# Patient Record
Sex: Female | Born: 1950 | Race: White | Hispanic: No | Marital: Married | Smoking: Never smoker
Health system: Southern US, Community
[De-identification: ages and names within clinical notes are randomized; demographics above are authoritative.]

## PROBLEM LIST (undated history)

## (undated) DIAGNOSIS — E039 Hypothyroidism, unspecified: Secondary | ICD-10-CM

## (undated) DIAGNOSIS — I10 Essential (primary) hypertension: Secondary | ICD-10-CM

## (undated) DIAGNOSIS — M1990 Unspecified osteoarthritis, unspecified site: Secondary | ICD-10-CM

## (undated) DIAGNOSIS — E7800 Pure hypercholesterolemia, unspecified: Secondary | ICD-10-CM

## (undated) DIAGNOSIS — F419 Anxiety disorder, unspecified: Secondary | ICD-10-CM

## (undated) DIAGNOSIS — G43909 Migraine, unspecified, not intractable, without status migrainosus: Secondary | ICD-10-CM

## (undated) DIAGNOSIS — J45909 Unspecified asthma, uncomplicated: Secondary | ICD-10-CM

## (undated) HISTORY — DX: Migraine, unspecified, not intractable, without status migrainosus: G43.909

## (undated) HISTORY — DX: Pure hypercholesterolemia, unspecified: E78.00

## (undated) HISTORY — DX: Essential (primary) hypertension: I10

## (undated) HISTORY — PX: TONSILLECTOMY AND ADENOIDECTOMY: SHX28

## (undated) HISTORY — DX: Anxiety disorder, unspecified: F41.9

---

## 2002-11-05 ENCOUNTER — Encounter: Payer: Self-pay | Admitting: Emergency Medicine

## 2002-11-05 ENCOUNTER — Emergency Department (HOSPITAL_COMMUNITY): Admission: EM | Admit: 2002-11-05 | Discharge: 2002-11-05 | Payer: Self-pay | Admitting: Emergency Medicine

## 2003-01-08 ENCOUNTER — Encounter: Payer: Self-pay | Admitting: Family Medicine

## 2003-01-08 ENCOUNTER — Ambulatory Visit (HOSPITAL_COMMUNITY): Admission: RE | Admit: 2003-01-08 | Discharge: 2003-01-08 | Payer: Self-pay | Admitting: Family Medicine

## 2003-01-13 ENCOUNTER — Ambulatory Visit (HOSPITAL_COMMUNITY): Admission: RE | Admit: 2003-01-13 | Discharge: 2003-01-13 | Payer: Self-pay | Admitting: Family Medicine

## 2003-01-13 ENCOUNTER — Encounter: Payer: Self-pay | Admitting: Family Medicine

## 2003-04-16 ENCOUNTER — Encounter (HOSPITAL_COMMUNITY): Admission: RE | Admit: 2003-04-16 | Discharge: 2003-05-16 | Payer: Self-pay | Admitting: Neurosurgery

## 2004-03-31 ENCOUNTER — Encounter: Payer: Self-pay | Admitting: Orthopedic Surgery

## 2004-04-06 ENCOUNTER — Ambulatory Visit (HOSPITAL_COMMUNITY): Admission: RE | Admit: 2004-04-06 | Discharge: 2004-04-06 | Payer: Self-pay | Admitting: Orthopedic Surgery

## 2004-11-25 ENCOUNTER — Ambulatory Visit: Payer: Self-pay | Admitting: Cardiology

## 2006-04-24 ENCOUNTER — Ambulatory Visit: Payer: Self-pay | Admitting: Cardiology

## 2009-07-28 ENCOUNTER — Encounter: Payer: Self-pay | Admitting: Orthopedic Surgery

## 2009-12-15 ENCOUNTER — Ambulatory Visit (HOSPITAL_COMMUNITY): Admission: RE | Admit: 2009-12-15 | Discharge: 2009-12-15 | Payer: Self-pay | Admitting: Family Medicine

## 2009-12-15 ENCOUNTER — Encounter: Payer: Self-pay | Admitting: Orthopedic Surgery

## 2010-01-03 ENCOUNTER — Ambulatory Visit: Payer: Self-pay | Admitting: Orthopedic Surgery

## 2010-01-03 DIAGNOSIS — M23302 Other meniscus derangements, unspecified lateral meniscus, unspecified knee: Secondary | ICD-10-CM | POA: Insufficient documentation

## 2010-01-04 ENCOUNTER — Telehealth: Payer: Self-pay | Admitting: Orthopedic Surgery

## 2010-01-05 ENCOUNTER — Ambulatory Visit (HOSPITAL_COMMUNITY): Admission: RE | Admit: 2010-01-05 | Discharge: 2010-01-05 | Payer: Self-pay | Admitting: Orthopedic Surgery

## 2010-01-10 ENCOUNTER — Ambulatory Visit: Payer: Self-pay | Admitting: Orthopedic Surgery

## 2010-01-10 DIAGNOSIS — M25569 Pain in unspecified knee: Secondary | ICD-10-CM | POA: Insufficient documentation

## 2010-01-10 DIAGNOSIS — S82109A Unspecified fracture of upper end of unspecified tibia, initial encounter for closed fracture: Secondary | ICD-10-CM | POA: Insufficient documentation

## 2010-03-07 ENCOUNTER — Ambulatory Visit: Payer: Self-pay | Admitting: Orthopedic Surgery

## 2010-03-14 ENCOUNTER — Telehealth: Payer: Self-pay | Admitting: Orthopedic Surgery

## 2010-04-05 ENCOUNTER — Ambulatory Visit: Payer: Self-pay | Admitting: Orthopedic Surgery

## 2010-12-25 ENCOUNTER — Encounter: Payer: Self-pay | Admitting: Orthopedic Surgery

## 2011-01-05 NOTE — Letter (Signed)
Summary: *Orthopedic Consult Note  [facility]  [address]  [place], [place] [address]   Phone: [phone]  Fax: [phone]    Re:    Diana Skinner DOB:    05/01/1951   Dear: Loraine Leriche   Thank you for requesting that we see the above patient for consultation.  A copy of the detailed office note will be sent under separate cover, for your review.  Evaluation today is consistent with:  1)  DERANGEMENT MENISCUS (ICD-717.5)   Our recommendation is for: MRI  Rest, activity modification, ibuprofen 800 t.i.d., ice t.i.d.       Thank you for this opportunity to look after your patient.  Sincerely,   Terrance Mass. MD.

## 2011-01-05 NOTE — Progress Notes (Signed)
Summary: patient question about left leg  Phone Note Call from Patient   Caller: Patient Summary of Call: Patient called to ask about swelling and tightness around calf, after having been on her leg for long periods over the weekend with Mom in hospital.  States wearing brace all the time; states ankle swelling now down, as she did rest and used  ice and heat alternating.  Any other recommendations?  Her appt is this month.  Any other recommendations? Work ph [phone] until 5:30pm, or Cell ph [phone] Initial call taken by: Cammie Sickle,  March 14, 2010 4:29 PM  Follow-up for Phone Call        Use heat not ice, take Tylenol as needed, continue brace Follow-up by: Ether Griffins,  March 14, 2010 4:35 PM  Additional Follow-up for Phone Call Additional follow up Details #1::        advised patient. Additional Follow-up by: Cammie Sickle,  March 14, 2010 4:49 PM

## 2011-01-05 NOTE — Letter (Signed)
Summary: History form  History form   Imported By: Jacklynn Ganong 01/05/2010 16:40:07  _____________________________________________________________________  External Attachment:    Type:   Image     Comment:   External Document

## 2011-01-05 NOTE — Assessment & Plan Note (Signed)
Summary: 4 WK RE-CK LT KNEE/BCBS/CAF   Visit Type:  Follow-up Referring Provider:  Dr. Patrica Duel.  CC:  recheck left knee.  History of Present Illness: a 60 year old female with proximal tibial stress reaction treated with rest activity modification and bracing.  She used a cane and some ibuprofen.  She says she's doing better with 95% improvement  Medial tibial area is mildly tender there is no pain with valgus stress there is no joint effusion there is normal range of motion  Impression healed medial tibial stress reaction patient is allowed to gradually return to normal activity      Allergies: 1)  ! Sulfa   Impression & Recommendations:  Problem # 1:  CLOSED FRACTURE OF UPPER END OF TIBIA (ICD-823.00) Assessment Improved  Orders: Est. Patient Level II (81191)  Patient Instructions: 1)  Walking ok  2)  return as needed

## 2011-01-05 NOTE — Assessment & Plan Note (Signed)
Summary: LT KNEE PAIN/XRAY [facility] 12/15/09/REF [facility]EN[facility]/BCBS/CAF   Visit Type:  New problem Referring Provider:  Dr. Patrica Duel.  CC:  left knee pain.  History of Present Illness: I saw Diana Skinner in the office today for a followup visit.  She is a 60 years old woman with the complaint of:  NEW PROBLEM. LEFT KNEE PAIN.  This is a 60 year old female who started having some LEFT knee pain near Christmas time when she slipped and fell she reached now complains of severe throbbing constant pain which is worse in the evening after walking all day.  She complains of locking and swelling.  She did place some ice on her knee and took some ibuprofen but got concerned that she was taking too much ibuprofen.   [name] on 12-15-09. Small joint effusion. No other finding.      Allergies: 1)  ! Sulfa  Past History:  Past Surgical History: Tonsillectomy C-section  Physical Exam  Additional Exam:  Her vital signs are stable as recorded she is well-developed and nourished her grooming and hygiene or excellent she is oriented x3 her mood is pleasant  Her gait is altered by a LEFT lower extremity limp her LEFT knee is swollen and tender on the medial compartment she has a positive Murray sign her range of motion is limited to 100.  The knee is stable.  Strength in the knee is normal.  Skin is intact.  RIGHT knee inspection normal range of motion normal stability normal strength normal skin normal  Pulses and temperature are normal in both lower extremities with no edema or swelling.  She has no lymphadenopathy her sensation is normal in both legs and her reflexes are 2+ at the knee and ankle coordination and balance remained normal     Impression & Recommendations:  Problem # 1:  DERANGEMENT MENISCUS (ICD-717.5) Assessment New  the x-rays at the hospital 4 views of the knee show small joint effusion no arthritis  I agree with the report as well.  She has McMurray sign which  is positive and medial joint line tenderness and locking.  She most likely has a medial meniscal tear.  Recommend ice, anti-inflammatory, activity modification an MRI to confirm in preparation for surgery.  Orders: New Patient Level III (40981)  Patient Instructions: 1)  MRI LEFT KNEE F/U HERE  2)  TORN MEDIAL MENISCUS  3)  APPLY ICE UP TO 3 X A DAY FOR 30 MINUTES  4)  TAKE IBUPROFEN 800MG  by mouth three times a day UNTIL YOU COME BACK  5)  AVOID KNEELING AND SQUATTING OR XS WALKING

## 2011-01-05 NOTE — Assessment & Plan Note (Signed)
Summary: mri results from [facility]   Visit Type:  Follow-up Referring Provider:  Dr. Patrica Duel.  CC:  mri results left knee.  History of Present Illness: I saw Diana Skinner in the office today for a followup visit.  She is a 60 years old woman with the complaint of:  DX:  Left knee pain.  Image:  MRI left knee [facility] 01/05/10/  Reading: IMPRESSION: 1.  Small subchondral impaction fracture in the far medial weightbearing surface of the medial femoral condyle with surrounding edema.  Edema extends to the adjacent soft tissues. 2.  Ruptured Baker's cyst with fluid tracking in the popliteal fossa and proximal medial leg. 3.  Intact cruciate ligaments, collateral ligaments, and menisci with degenerative changes of posterior horn of the medial meniscus.  Ice and Ibuprofen use for pain.  Not so bad today, has alot of soreness left calf.  Swelling is down, no brace.  The MRI was done at [facility] and it was reviewed with the report   Rec Brace for stress reaction in the bone    Allergies: 1)  ! Sulfa   Impression & Recommendations:  Problem # 1:  KNEE PAIN (ICD-719.46)  Problem # 2:  CLOSED FRACTURE OF UPPER END OF TIBIA (ICD-823.00)  This is a stress reaction !  Orders: Est. Patient Level II (40981)  Patient Instructions: 1)  wear brace when walking  2)  wear for 8 weeks  3)  return in 8 weeks

## 2011-01-05 NOTE — Assessment & Plan Note (Signed)
Summary: 8 WK RE-CK LT KNEE/FOLL'G BRACE/BCBS/CAF   Visit Type:  Follow-up Referring Provider:  Dr. Patrica Duel.  CC:  recheck left knee.  History of Present Illness: 60 yo female with stress fracture left proximal tibia. Treated with ECON HINGE brace x 8 weeks    C/o medial knee pain with tightness in the calf. the swelling has gone down.     MEDS: IBUPROFEN.        Allergies: 1)  ! Sulfa  Past History:  Past medical, surgical, family and social histories (including risk factors) reviewed, and no changes noted (except as noted below).  Past Medical History: Reviewed history from 07/27/2009 and no changes required. Migraines  Past Surgical History: Reviewed history from 01/03/2010 and no changes required. Tonsillectomy C-section  Family History: Reviewed history and no changes required.  Social History: Reviewed history and no changes required.   Knee Exam  General:    Well-developed, well-nourished, normal body habitus; no deformities, normal grooming.  Gait:    limp noted-left.    Skin:    Intact, no scars, lesions, rashes, cafe au lait spots, or bruising.    Inspection:     No deformity, ecchymosis mild swelling in the ankle distal to the brace   Palpation:    tenderness L-medial joint line.    Vascular:    The pulses and perfusion were normal with normal color, temperature  and no swelling   Sensory:    Gross coordination and sensation were normal.    Motor:    Motor strength 5/5 bilaterally for quadriceps, hamstrings, ankle dorsiflexion, and ankle plantar flexion.    Knee Exam:    Left:    Inspection:  Abnormal    Palpation:  Abnormal    Stability:  stable    Range of Motion:       Flexion-Active: full       Extension-Active: full   Impression & Recommendations:  Problem # 1:  CLOSED FRACTURE OF UPPER END OF TIBIA (ICD-823.00) Assessment Comment Only  Slight improvement but still having pain   Orders: Est. Patient Level  III (13086)  Patient Instructions: 1)  Use a cane in your right hand, continue brace and meds. 2)  Come back in 4 weeks

## 2011-01-05 NOTE — Progress Notes (Signed)
Summary: MRI appointment.  Phone Note Outgoing Call   Call placed by: Waldon Reining,  January 04, 2010 12:03 PM Call placed to: Patient Action Taken: Phone Call Completed, Appt scheduled Summary of Call: I called to give patient her MRI appointment at [facility] on 01-05-10 at 4:30. Patient has BCBS, authorization [id number] and expires on 02-02-10. Patient will follow up here for her results.

## 2011-05-30 ENCOUNTER — Ambulatory Visit (INDEPENDENT_AMBULATORY_CARE_PROVIDER_SITE_OTHER): Payer: BC Managed Care – PPO | Admitting: Orthopedic Surgery

## 2011-05-30 ENCOUNTER — Encounter: Payer: Self-pay | Admitting: Orthopedic Surgery

## 2011-05-30 DIAGNOSIS — M76899 Other specified enthesopathies of unspecified lower limb, excluding foot: Secondary | ICD-10-CM

## 2011-05-30 DIAGNOSIS — M7050 Other bursitis of knee, unspecified knee: Secondary | ICD-10-CM | POA: Insufficient documentation

## 2011-05-30 MED ORDER — METHYLPREDNISOLONE ACETATE 40 MG/ML IJ SUSP: 40.0000 mg | Freq: Once | INTRAMUSCULAR | Status: DC

## 2011-05-30 NOTE — Patient Instructions (Addendum)
You have received a steroid shot. 15% of patients experience increased pain at the injection site with in the next 24 hours. This is best treated with ice and tylenol extra strength 2 tabs every 8 hours. If you are still having pain please call the office.   Apply ice 3 times a day to the medial knee   You have bursitis of the knee   DR ZOXWRUE   DR DUDA

## 2011-05-30 NOTE — Procedures (Signed)
Diagnosis of bursitis  I injected the LEFT knee for bursitis  Informed consent verbally.  Time out.  Alcohol prep ethyl chloride prep.  A 1:1 mixture Depo-Medrol 40 mg and 1% plain lidocaine injected in the pes bursal area

## 2011-05-30 NOTE — Progress Notes (Signed)
   This is a 60 year old female who was previously treated for a medial tibial stress reaction at x-rays and MRI back in 2011 recovered presents back with medial knee pain which is exacerbated by stair climbing and bike riding.  She's been having some difficulties with her RIGHT foot, seen the podiatrist and received 2 injections but continues to have swelling of the RIGHT foot and pain in the heel.  She thinks that she is limping on the RIGHT side and is causing industries stress on the LEFT knee  Her review of systems indicates no unexpected weight loss she's actually gained some weight because she can't exercise.  No skin changes no itching no redness no rash.  Musculoskeletal system findings reveal no catching or locking  Medical history as noted above  General appearance is normal  The patient is alert and oriented x3  The patient's mood and affect are normal  The patient is ambulating with a abnormal gait pattern  The cardiovascular exam reveals normal pulses and temperature without edema swelling.  The lymphatic system is negative for palpable lymph nodes  The sensory exam is normal.  There are no pathologic reflexes.  Balance is normal.  Body area:LEFT knee inspection reveals tenderness and swelling over the medial bursa with normal range of motion.  All ligaments are stable.  Muscle strength and tone are normal.  Skin is normal.  Impression x-rays show no degenerative changes maybe some mild joint space narrowing expected for age.  Diagnosis of bursitis  I injected the LEFT knee for bursitis  Informed consent verbally.  Time out.  Alcohol prep ethyl chloride prep.  A 1:1 mixture Depo-Medrol 40 mg and 1% plain lidocaine injected in the pes bursal area

## 2011-06-05 ENCOUNTER — Telehealth: Payer: Self-pay | Admitting: Radiology

## 2011-06-05 NOTE — Telephone Encounter (Signed)
I faxed a referral for this patient to Dr. Lajoyce Corners to be seen for right foot pain.

## 2012-01-23 ENCOUNTER — Other Ambulatory Visit (HOSPITAL_COMMUNITY): Payer: Self-pay | Admitting: Internal Medicine

## 2012-01-23 DIAGNOSIS — Z139 Encounter for screening, unspecified: Secondary | ICD-10-CM

## 2012-01-24 ENCOUNTER — Telehealth: Payer: Self-pay

## 2012-01-24 NOTE — Telephone Encounter (Signed)
I called pt to triage. She requests to see Dr. Karilyn Cota.  I told her I will fax Marylu Lund a note at [facility] of her request. She said that her son and husband sees dr. Karilyn Cota.

## 2012-01-26 ENCOUNTER — Encounter (INDEPENDENT_AMBULATORY_CARE_PROVIDER_SITE_OTHER): Payer: Self-pay | Admitting: *Deleted

## 2012-01-29 ENCOUNTER — Ambulatory Visit (HOSPITAL_COMMUNITY): Admission: RE | Admit: 2012-01-29 | Payer: BC Managed Care – PPO | Source: Ambulatory Visit

## 2012-02-12 ENCOUNTER — Ambulatory Visit (HOSPITAL_COMMUNITY): Payer: BC Managed Care – PPO

## 2012-02-19 ENCOUNTER — Ambulatory Visit (HOSPITAL_COMMUNITY): Admission: RE | Admit: 2012-02-19 | Payer: BC Managed Care – PPO | Source: Ambulatory Visit

## 2012-04-25 ENCOUNTER — Telehealth (INDEPENDENT_AMBULATORY_CARE_PROVIDER_SITE_OTHER): Payer: Self-pay | Admitting: *Deleted

## 2012-04-25 ENCOUNTER — Encounter (INDEPENDENT_AMBULATORY_CARE_PROVIDER_SITE_OTHER): Payer: Self-pay | Admitting: *Deleted

## 2012-04-25 ENCOUNTER — Other Ambulatory Visit (INDEPENDENT_AMBULATORY_CARE_PROVIDER_SITE_OTHER): Payer: Self-pay | Admitting: *Deleted

## 2012-04-25 DIAGNOSIS — Z1211 Encounter for screening for malignant neoplasm of colon: Secondary | ICD-10-CM

## 2012-04-25 MED ORDER — PEG-KCL-NACL-NASULF-NA ASC-C 100 G PO SOLR: 1.0000 | Freq: Once | ORAL | Status: DC

## 2012-04-25 NOTE — Telephone Encounter (Signed)
Patient needs movi prep 

## 2012-05-23 ENCOUNTER — Telehealth (INDEPENDENT_AMBULATORY_CARE_PROVIDER_SITE_OTHER): Payer: Self-pay | Admitting: *Deleted

## 2012-05-23 NOTE — Telephone Encounter (Signed)
PCP/Requesting MD: fusco  Name & DOB: Diana Skinner 16-Mar-2051     Procedure: tcs  Reason/Indication:  screening  Has patient had this procedure before?  no  If so, when, by whom and where?    Is there a family history of colon cancer?  no  Who?  What age when diagnosed?    Is patient diabetic?   no      Does patient have prosthetic heart valve?  no  Do you have a pacemaker?  no  Has patient had joint replacement within last 12 months?  no  Is patient on Coumadin, Plavix and/or Aspirin? no  Medications: losartan 50 mg daily, venlafaxine 75 mg daily, simvastatin 20 mg daily  Allergies: zocor, sulfur  Medication Adjustment:   Procedure date & time: 06/19/12 at 1030

## 2012-05-24 NOTE — Telephone Encounter (Signed)
agree

## 2012-06-04 ENCOUNTER — Encounter (HOSPITAL_COMMUNITY): Payer: Self-pay | Admitting: Pharmacy Technician

## 2012-06-19 ENCOUNTER — Encounter (HOSPITAL_COMMUNITY)
Admission: RE | Disposition: A | Discharge status: Home / Self Care | Payer: Self-pay | Source: Ambulatory Visit | Attending: Internal Medicine

## 2012-06-19 ENCOUNTER — Encounter (HOSPITAL_COMMUNITY): Payer: Self-pay | Admitting: *Deleted

## 2012-06-19 ENCOUNTER — Ambulatory Visit (HOSPITAL_COMMUNITY)
Admission: RE | Admit: 2012-06-19 | Discharge: 2012-06-19 | Disposition: A | Discharge status: Home / Self Care | Payer: BC Managed Care – PPO | Source: Ambulatory Visit | Attending: Internal Medicine | Admitting: Internal Medicine

## 2012-06-19 DIAGNOSIS — K644 Residual hemorrhoidal skin tags: Secondary | ICD-10-CM

## 2012-06-19 DIAGNOSIS — I10 Essential (primary) hypertension: Secondary | ICD-10-CM | POA: Insufficient documentation

## 2012-06-19 DIAGNOSIS — Z1211 Encounter for screening for malignant neoplasm of colon: Secondary | ICD-10-CM

## 2012-06-19 DIAGNOSIS — E7800 Pure hypercholesterolemia, unspecified: Secondary | ICD-10-CM | POA: Insufficient documentation

## 2012-06-19 DIAGNOSIS — K5730 Diverticulosis of large intestine without perforation or abscess without bleeding: Secondary | ICD-10-CM | POA: Insufficient documentation

## 2012-06-19 HISTORY — DX: Unspecified osteoarthritis, unspecified site: M19.90

## 2012-06-19 HISTORY — PX: COLONOSCOPY: SHX5424

## 2012-06-19 SURGERY — COLONOSCOPY
Anesthesia: Moderate Sedation

## 2012-06-19 MED ORDER — STERILE WATER FOR IRRIGATION IR SOLN
Status: DC | PRN
  Administered 2012-06-19: 10:00:00

## 2012-06-19 MED ORDER — SODIUM CHLORIDE 0.45 % IV SOLN
Freq: Once | INTRAVENOUS | Status: AC
  Administered 2012-06-19: 10:00:00 via INTRAVENOUS

## 2012-06-19 MED ORDER — MEPERIDINE HCL 50 MG/ML IJ SOLN
INTRAMUSCULAR | Status: DC | PRN
  Administered 2012-06-19 (×2): 25 mg via INTRAVENOUS

## 2012-06-19 MED ORDER — MIDAZOLAM HCL 5 MG/5ML IJ SOLN
INTRAMUSCULAR | Status: AC
  Filled 2012-06-19: qty 10

## 2012-06-19 MED ORDER — MEPERIDINE HCL 50 MG/ML IJ SOLN
INTRAMUSCULAR | Status: AC
  Filled 2012-06-19: qty 1

## 2012-06-19 MED ORDER — MIDAZOLAM HCL 5 MG/5ML IJ SOLN
INTRAMUSCULAR | Status: DC | PRN
  Administered 2012-06-19: 2 mg via INTRAVENOUS
  Administered 2012-06-19: 1 mg via INTRAVENOUS
  Administered 2012-06-19: 2 mg via INTRAVENOUS

## 2012-06-19 NOTE — Op Note (Signed)
COLONOSCOPY PROCEDURE REPORT  PATIENT:  Diana Skinner  MR#:  [id number] Birthdate:  14-Apr-1951, 61 y.o., female Endoscopist:  Dr. Malissa Hippo, MD Referred By:  Dr. Madelin Rear. Sherwood Gambler, MD Procedure Date: 06/19/2012  Procedure:   Colonoscopy  Indications:  Patient is 61 year old Caucasian female was undergoing average risk screening colonoscopy. This is patient's first exam.  Informed Consent:  The procedure and risks were reviewed with the patient and informed consent was obtained.  Medications:  Demerol 50 mg IV Versed 5 mg IV  Description of procedure:  After a digital rectal exam was performed, that colonoscope was advanced from the anus through the rectum and colon to the area of the cecum, ileocecal valve and appendiceal orifice. The cecum was deeply intubated. These structures were well-seen and photographed for the record. From the level of the cecum and ileocecal valve, the scope was slowly and cautiously withdrawn. The mucosal surfaces were carefully surveyed utilizing scope tip to flexion to facilitate fold flattening as needed. The scope was pulled down into the rectum where a thorough exam including retroflexion was performed.  Findings:   Prep excellent. Few small diverticula at sigmoid colon. No evidence of colonic polyps. Normal rectal mucosa. Small hemorrhoids below the dentate line.  Therapeutic/Diagnostic Maneuvers Performed:  None  Complications:  None  Cecal Withdrawal Time:  8 minutes  Impression:  Examination performed to cecum. Few small diverticula at sigmoid colon. Small external hemorrhoids.  Recommendations:  Standard instructions given. Next screening exam in 10 years.  REHMAN,NAJEEB U  06/19/2012 10:46 AM  CC: Dr. Cassell Smiles., MD & Dr. Bonnetta Barry ref. provider found

## 2012-06-19 NOTE — H&P (Signed)
NEESA KNAPIK is an 61 y.o. female.   Chief Complaint: Patient is here for colonoscopy. HPI: Patient is 61 year old Caucasian female who is in for her first  screening colonoscopy. She denies in bowel habits rectal bleeding or abdominal pain. Family history is negative for colorectal carcinoma.  Past Medical History  Diagnosis Date  . HTN (hypertension)   . High cholesterol   . Migraines   . Anxiety   . Arthritis     Past Surgical History  Procedure Date  . Tonsillectomy and adenoidectomy   . Cesarean section     Family History  Problem Relation Age of Onset  . Heart disease     Social History:  reports that she has never smoked. She does not have any smokeless tobacco history on file. She reports that she drinks alcohol. She reports that she does not use illicit drugs.  Allergies:  Allergies  Allergen Reactions  . Sulfonamide Derivatives Swelling    Medications Prior to Admission  Medication Sig Dispense Refill  . losartan (COZAAR) 50 MG tablet Take 50 mg by mouth daily.      Marland Kitchen omeprazole (PRILOSEC) 40 MG capsule Take 40 mg by mouth once.      . peg 3350 powder (MOVIPREP) 100 G SOLR Take 1 kit (100 g total) by mouth once.  1 kit  0  . simvastatin (ZOCOR) 20 MG tablet Take 20 mg by mouth daily.      Marland Kitchen venlafaxine (EFFEXOR) 75 MG tablet Take 75 mg by mouth daily.        No results found for this or any previous visit (from the past 48 hour(s)). No results found.  ROS  Blood pressure 126/90, pulse 83, temperature 97.5 F (36.4 C), temperature source Oral, resp. rate 18, height 5\' 7"  (1.702 m), weight 169 lb (76.658 kg), SpO2 92.00%. Physical Exam  Constitutional: She appears well-developed and well-nourished.  HENT:  Mouth/Throat: Oropharynx is clear and moist.  Eyes: Conjunctivae are normal.  Neck: No thyromegaly present.  Cardiovascular: Normal rate, regular rhythm and normal heart sounds.   No murmur heard. Respiratory: Effort normal and breath sounds  normal.  GI: Soft. She exhibits no distension and no mass.  Lymphadenopathy:    She has no cervical adenopathy.  Neurological: She is alert.  Skin: Skin is warm and dry.     Assessment/Plan Average risk screening colonoscopy.  Shadi Larner U 06/19/2012, 10:22 AM

## 2012-06-25 ENCOUNTER — Encounter (HOSPITAL_COMMUNITY): Payer: Self-pay | Admitting: Internal Medicine

## 2012-10-01 ENCOUNTER — Other Ambulatory Visit (HOSPITAL_COMMUNITY): Payer: Self-pay | Admitting: Physician Assistant

## 2012-10-01 DIAGNOSIS — Z139 Encounter for screening, unspecified: Secondary | ICD-10-CM

## 2012-10-08 ENCOUNTER — Ambulatory Visit (HOSPITAL_COMMUNITY)
Admission: RE | Admit: 2012-10-08 | Discharge: 2012-10-08 | Disposition: A | Discharge status: Home / Self Care | Payer: BC Managed Care – PPO | Source: Ambulatory Visit | Attending: Physician Assistant | Admitting: Physician Assistant

## 2012-10-08 DIAGNOSIS — Z1231 Encounter for screening mammogram for malignant neoplasm of breast: Secondary | ICD-10-CM | POA: Insufficient documentation

## 2012-10-08 DIAGNOSIS — Z139 Encounter for screening, unspecified: Secondary | ICD-10-CM

## 2013-05-21 IMAGING — MG MM DIGITAL SCREENING BILAT
4 series · 4 of 4 positions shown · non-contrast
Comparison: None.

CLINICAL DATA: Screening.

DIGITAL BILATERAL SCREENING MAMMOGRAM WITH CAD

[L CC]
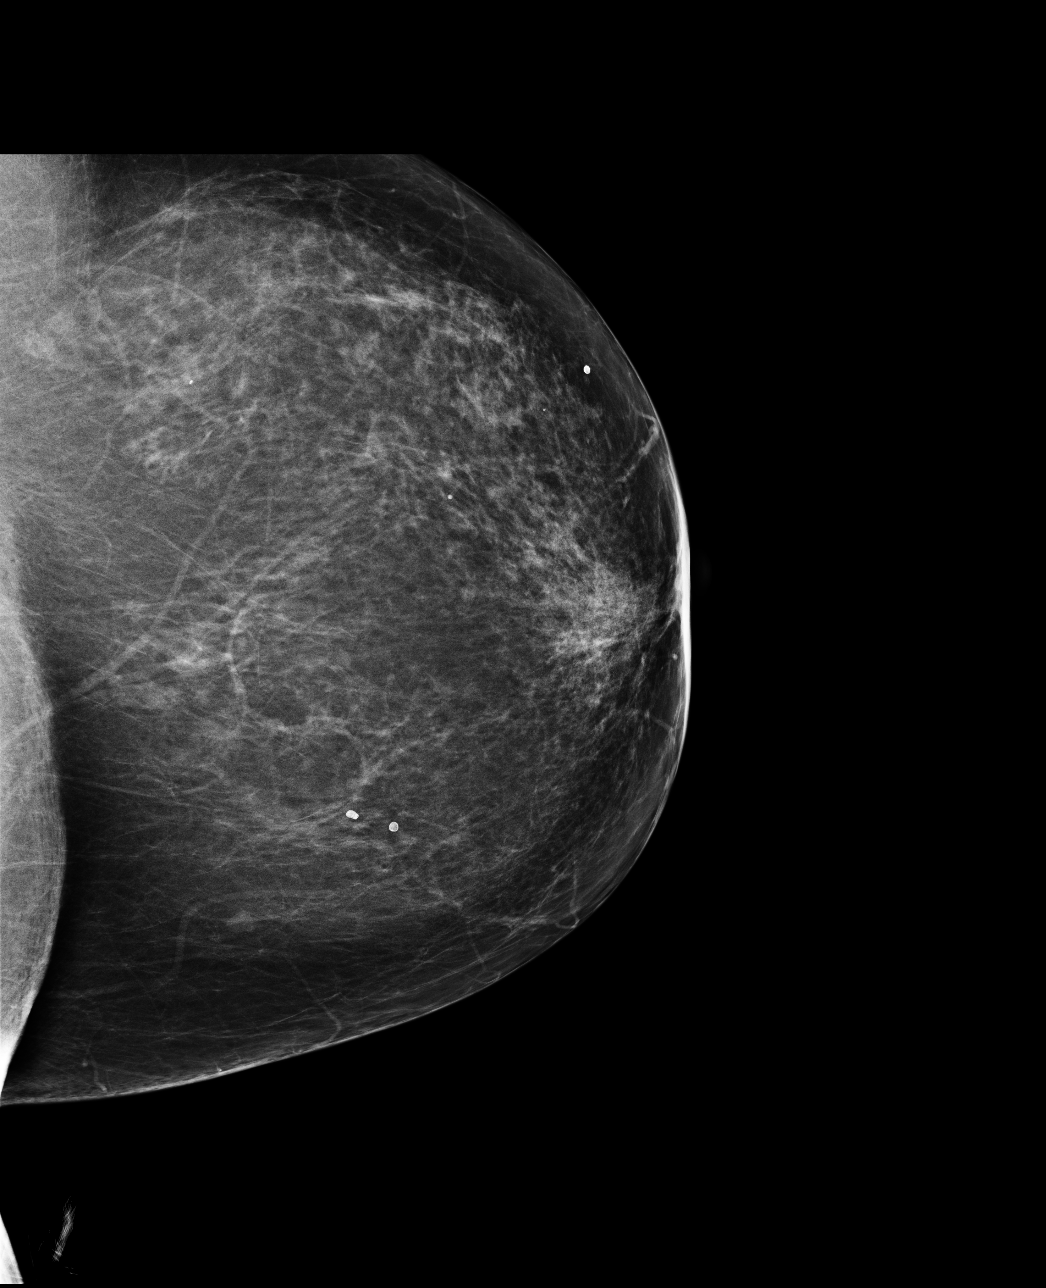

[L MLO]
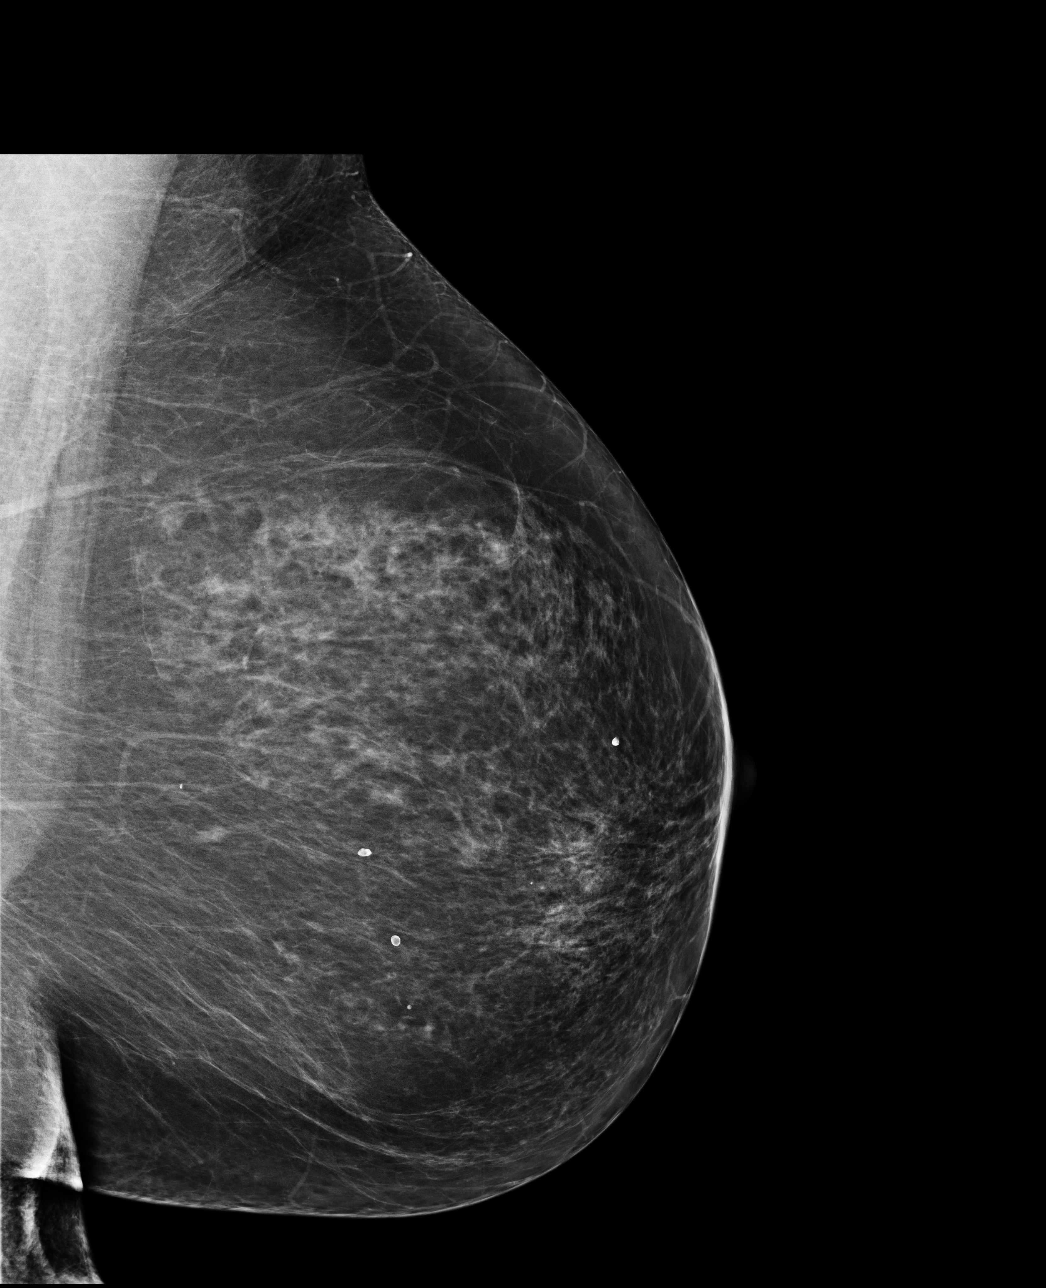

[R CC]
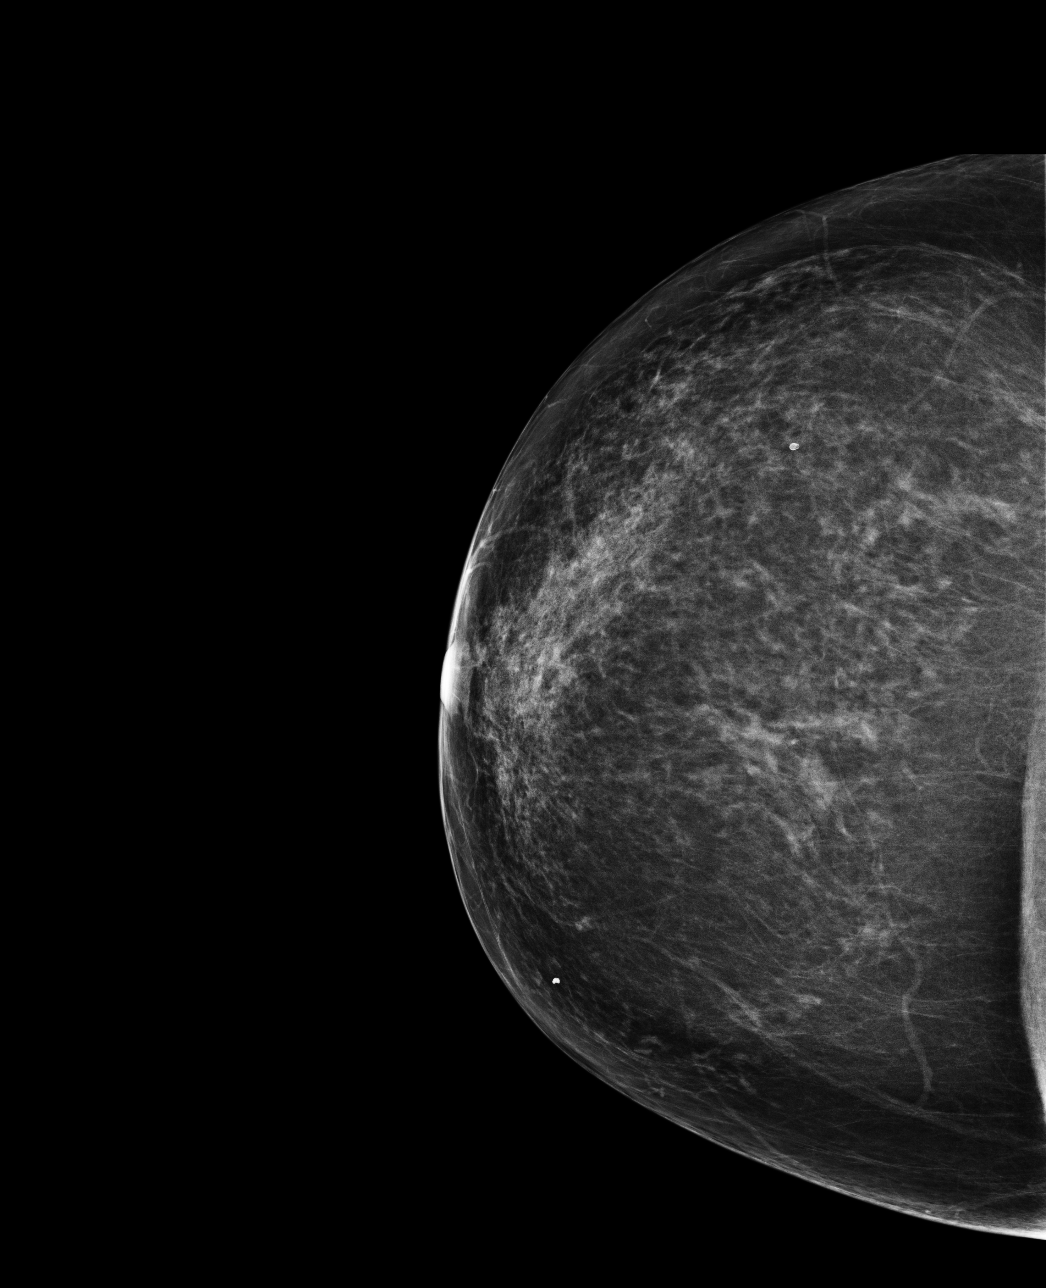

[R MLO]
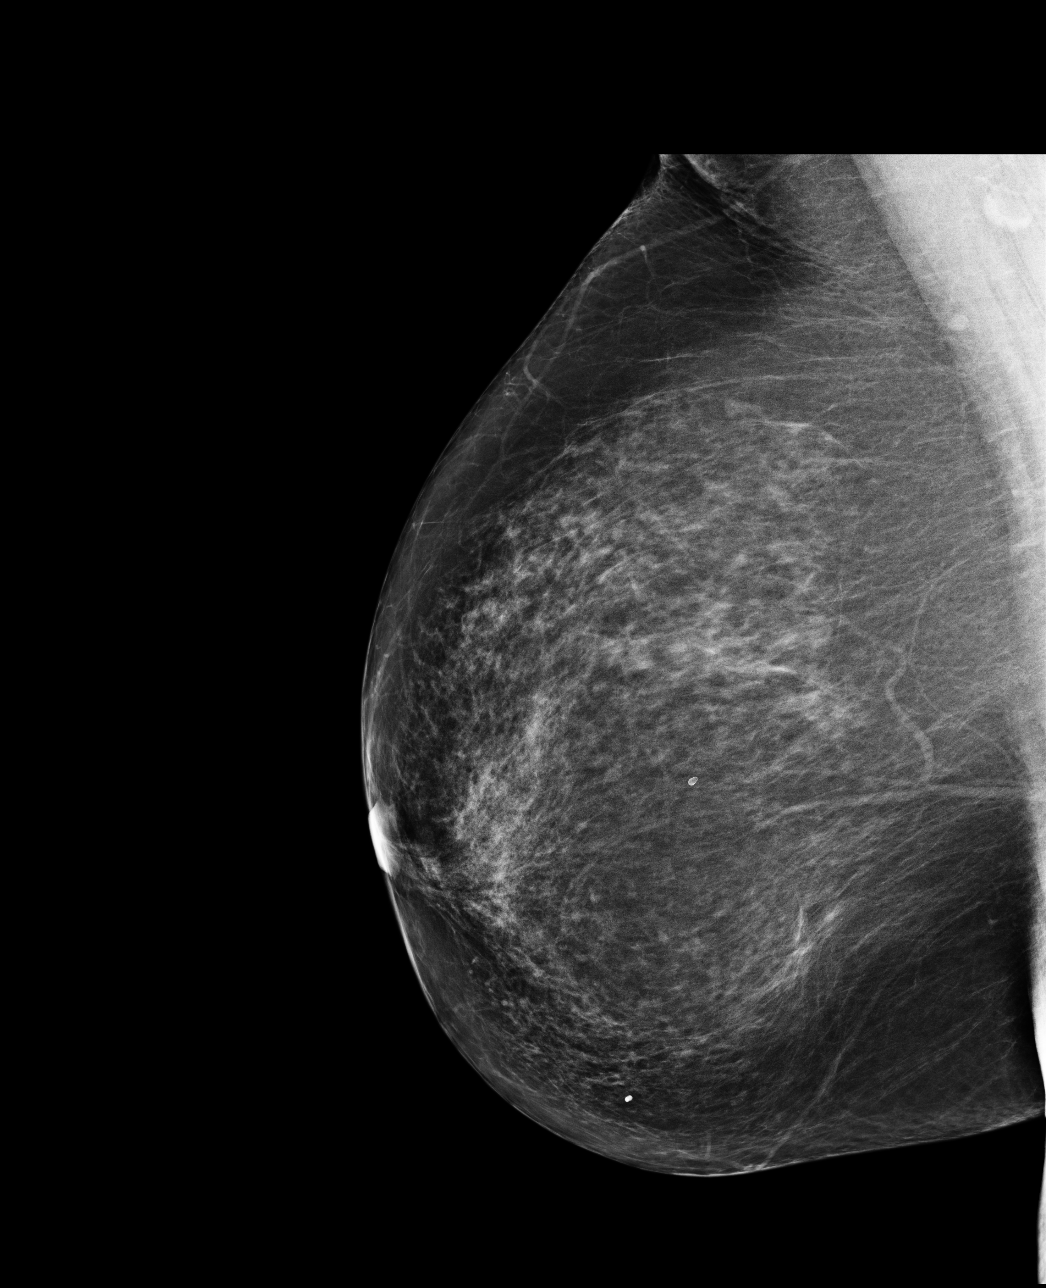

[4 of 4 positions shown; findings below may reference images not displayed]

FINDINGS: There are scattered fibroglandular densities. No
suspicious masses, architectural distortion, or calcifications are
present.

Images were processed with CAD.
IMPRESSION: No mammographic evidence of malignancy.

A result letter of this screening mammogram will be mailed directly
to the patient.

RECOMMENDATION:
Screening mammogram in one year. (Code:[id number])

BI-RADS CATEGORY 1:  Negative.

## 2014-11-25 ENCOUNTER — Ambulatory Visit (HOSPITAL_COMMUNITY): Admission: RE | Admit: 2014-11-25 | Payer: BC Managed Care – PPO | Source: Ambulatory Visit

## 2014-11-25 ENCOUNTER — Other Ambulatory Visit (HOSPITAL_COMMUNITY): Payer: Self-pay | Admitting: Family Medicine

## 2014-11-25 DIAGNOSIS — J209 Acute bronchitis, unspecified: Secondary | ICD-10-CM

## 2014-12-08 ENCOUNTER — Other Ambulatory Visit (HOSPITAL_COMMUNITY): Payer: Self-pay | Admitting: Family Medicine

## 2014-12-08 ENCOUNTER — Ambulatory Visit (HOSPITAL_COMMUNITY)
Admission: RE | Admit: 2014-12-08 | Discharge: 2014-12-08 | Disposition: A | Discharge status: Home / Self Care | Payer: 59 | Source: Ambulatory Visit | Attending: Family Medicine | Admitting: Family Medicine

## 2014-12-08 DIAGNOSIS — J209 Acute bronchitis, unspecified: Secondary | ICD-10-CM

## 2014-12-08 DIAGNOSIS — J984 Other disorders of lung: Secondary | ICD-10-CM | POA: Insufficient documentation

## 2014-12-08 DIAGNOSIS — J189 Pneumonia, unspecified organism: Secondary | ICD-10-CM | POA: Diagnosis present

## 2016-08-27 ENCOUNTER — Emergency Department (HOSPITAL_COMMUNITY): Payer: BLUE CROSS/BLUE SHIELD

## 2016-08-27 ENCOUNTER — Observation Stay (HOSPITAL_COMMUNITY): Payer: BLUE CROSS/BLUE SHIELD | Admitting: Anesthesiology

## 2016-08-27 ENCOUNTER — Encounter (HOSPITAL_COMMUNITY): Payer: Self-pay | Admitting: Emergency Medicine

## 2016-08-27 ENCOUNTER — Observation Stay (HOSPITAL_COMMUNITY)
Admission: EM | Admit: 2016-08-27 | Discharge: 2016-08-27 | Disposition: A | Discharge status: Home / Self Care | Payer: BLUE CROSS/BLUE SHIELD | Attending: Physician Assistant | Admitting: Physician Assistant

## 2016-08-27 ENCOUNTER — Encounter (HOSPITAL_COMMUNITY)
Admission: EM | Disposition: A | Discharge status: Home / Self Care | Payer: Self-pay | Source: Home / Self Care | Attending: Emergency Medicine

## 2016-08-27 DIAGNOSIS — Y92480 Sidewalk as the place of occurrence of the external cause: Secondary | ICD-10-CM | POA: Insufficient documentation

## 2016-08-27 DIAGNOSIS — S62101B Fracture of unspecified carpal bone, right wrist, initial encounter for open fracture: Secondary | ICD-10-CM | POA: Diagnosis present

## 2016-08-27 DIAGNOSIS — F419 Anxiety disorder, unspecified: Secondary | ICD-10-CM | POA: Insufficient documentation

## 2016-08-27 DIAGNOSIS — S62101A Fracture of unspecified carpal bone, right wrist, initial encounter for closed fracture: Secondary | ICD-10-CM | POA: Diagnosis present

## 2016-08-27 DIAGNOSIS — W010XXA Fall on same level from slipping, tripping and stumbling without subsequent striking against object, initial encounter: Secondary | ICD-10-CM | POA: Insufficient documentation

## 2016-08-27 DIAGNOSIS — I10 Essential (primary) hypertension: Secondary | ICD-10-CM | POA: Diagnosis not present

## 2016-08-27 DIAGNOSIS — S52501B Unspecified fracture of the lower end of right radius, initial encounter for open fracture type I or II: Secondary | ICD-10-CM

## 2016-08-27 DIAGNOSIS — S52571B Other intraarticular fracture of lower end of right radius, initial encounter for open fracture type I or II: Secondary | ICD-10-CM | POA: Diagnosis not present

## 2016-08-27 DIAGNOSIS — S52601B Unspecified fracture of lower end of right ulna, initial encounter for open fracture type I or II: Secondary | ICD-10-CM | POA: Diagnosis not present

## 2016-08-27 DIAGNOSIS — M1990 Unspecified osteoarthritis, unspecified site: Secondary | ICD-10-CM | POA: Diagnosis not present

## 2016-08-27 DIAGNOSIS — E7800 Pure hypercholesterolemia, unspecified: Secondary | ICD-10-CM | POA: Insufficient documentation

## 2016-08-27 HISTORY — PX: ORIF RADIAL FRACTURE: SHX5113

## 2016-08-27 LAB — CBC WITH DIFFERENTIAL/PLATELET
BASOS ABS: 0 10*3/uL (ref 0.0–0.1)
BASOS PCT: 0 %
Eosinophils Absolute: 0.3 10*3/uL (ref 0.0–0.7)
Eosinophils Relative: 3 %
HEMATOCRIT: 43.7 % (ref 36.0–46.0)
HEMOGLOBIN: 14.4 g/dL (ref 12.0–15.0)
Lymphocytes Relative: 34 %
Lymphs Abs: 2.7 10*3/uL (ref 0.7–4.0)
MCH: 31 pg (ref 26.0–34.0)
MCHC: 33 g/dL (ref 30.0–36.0)
MCV: 94 fL (ref 78.0–100.0)
MONO ABS: 0.7 10*3/uL (ref 0.1–1.0)
Monocytes Relative: 9 %
NEUTROS ABS: 4.3 10*3/uL (ref 1.7–7.7)
NEUTROS PCT: 54 %
Platelets: 301 10*3/uL (ref 150–400)
RBC: 4.65 MIL/uL (ref 3.87–5.11)
RDW: 13.3 % (ref 11.5–15.5)
WBC: 8 10*3/uL (ref 4.0–10.5)

## 2016-08-27 LAB — BASIC METABOLIC PANEL
ANION GAP: 5 (ref 5–15)
BUN: 13 mg/dL (ref 6–20)
CALCIUM: 8.7 mg/dL — AB (ref 8.9–10.3)
CO2: 25 mmol/L (ref 22–32)
Chloride: 105 mmol/L (ref 101–111)
Creatinine, Ser: 0.74 mg/dL (ref 0.44–1.00)
GFR calc non Af Amer: >60 mL/min (ref >60)
Glucose, Bld: 154 mg/dL — ABNORMAL HIGH (ref 65–99)
Potassium: 4.2 mmol/L (ref 3.5–5.1)
Sodium: 135 mmol/L (ref 135–145)

## 2016-08-27 SURGERY — OPEN REDUCTION INTERNAL FIXATION (ORIF) RADIAL FRACTURE
Anesthesia: Regional | Site: Arm Lower | Laterality: Right

## 2016-08-27 MED ORDER — MORPHINE SULFATE (PF) 4 MG/ML IV SOLN: 4.0000 mg | Freq: Once | INTRAVENOUS | Status: DC

## 2016-08-27 MED ORDER — DIPHTH-ACELL PERTUSSIS-TETANUS 25-58-10 LF-MCG/0.5 IM SUSP
0.5000 mL | Freq: Once | INTRAMUSCULAR | Status: DC
  Filled 2016-08-27: qty 0.5

## 2016-08-27 MED ORDER — PHENYLEPHRINE 40 MCG/ML (10ML) SYRINGE FOR IV PUSH (FOR BLOOD PRESSURE SUPPORT)
PREFILLED_SYRINGE | INTRAVENOUS | Status: AC
  Filled 2016-08-27: qty 10

## 2016-08-27 MED ORDER — BUPIVACAINE-EPINEPHRINE (PF) 0.5% -1:200000 IJ SOLN
INTRAMUSCULAR | Status: DC | PRN
  Administered 2016-08-27: 30 mL via PERINEURAL

## 2016-08-27 MED ORDER — HYDROCODONE-ACETAMINOPHEN 7.5-325 MG PO TABS: 1.0000 | ORAL_TABLET | Freq: Once | ORAL | Status: DC | PRN

## 2016-08-27 MED ORDER — HYDROMORPHONE HCL 1 MG/ML IJ SOLN: 0.2500 mg | INTRAMUSCULAR | Status: DC | PRN

## 2016-08-27 MED ORDER — LIDOCAINE 2% (20 MG/ML) 5 ML SYRINGE
INTRAMUSCULAR | Status: AC
  Filled 2016-08-27: qty 5

## 2016-08-27 MED ORDER — MIDAZOLAM HCL 5 MG/5ML IJ SOLN
INTRAMUSCULAR | Status: DC | PRN
  Administered 2016-08-27 (×2): 1 mg via INTRAVENOUS

## 2016-08-27 MED ORDER — CEFAZOLIN SODIUM 1 G IJ SOLR
INTRAMUSCULAR | Status: DC | PRN
  Administered 2016-08-27: 1 g via INTRAMUSCULAR

## 2016-08-27 MED ORDER — KETOROLAC TROMETHAMINE 30 MG/ML IJ SOLN: 30.0000 mg | Freq: Once | INTRAMUSCULAR | Status: DC | PRN

## 2016-08-27 MED ORDER — FENTANYL CITRATE (PF) 100 MCG/2ML IJ SOLN
INTRAMUSCULAR | Status: AC
  Filled 2016-08-27: qty 2

## 2016-08-27 MED ORDER — LIDOCAINE 2% (20 MG/ML) 5 ML SYRINGE
INTRAMUSCULAR | Status: DC | PRN
  Administered 2016-08-27: 20 mg via INTRAVENOUS

## 2016-08-27 MED ORDER — HYDROCODONE-ACETAMINOPHEN 7.5-325 MG PO TABS: 1.0000 | ORAL_TABLET | Freq: Once | ORAL | 0 refills | Status: DC | PRN

## 2016-08-27 MED ORDER — SODIUM CHLORIDE 0.9 % IV SOLN
INTRAVENOUS | Status: DC | PRN
  Administered 2016-08-27: 13:00:00 via INTRAVENOUS

## 2016-08-27 MED ORDER — PROPOFOL 10 MG/ML IV BOLUS
INTRAVENOUS | Status: DC | PRN
Start: 2016-08-27 — End: 2016-08-27
  Administered 2016-08-27: 160 mg via INTRAVENOUS

## 2016-08-27 MED ORDER — GENTAMICIN SULFATE 40 MG/ML IJ SOLN
120.0000 mg | INTRAVENOUS | Status: AC
  Administered 2016-08-27: 120 mg via INTRAVENOUS
  Filled 2016-08-27: qty 3

## 2016-08-27 MED ORDER — ONDANSETRON HCL 4 MG/2ML IJ SOLN
INTRAMUSCULAR | Status: AC
  Filled 2016-08-27: qty 2

## 2016-08-27 MED ORDER — PROPOFOL 10 MG/ML IV BOLUS
INTRAVENOUS | Status: AC
  Filled 2016-08-27: qty 20

## 2016-08-27 MED ORDER — SODIUM CHLORIDE 0.9 % IJ SOLN
INTRAMUSCULAR | Status: AC
  Filled 2016-08-27: qty 10

## 2016-08-27 MED ORDER — EPHEDRINE SULFATE-NACL 50-0.9 MG/10ML-% IV SOSY
PREFILLED_SYRINGE | INTRAVENOUS | Status: DC | PRN
  Administered 2016-08-27: 5 mg via INTRAVENOUS

## 2016-08-27 MED ORDER — TETANUS-DIPHTH-ACELL PERTUSSIS 5-2.5-18.5 LF-MCG/0.5 IM SUSP
0.5000 mL | Freq: Once | INTRAMUSCULAR | Status: AC
  Administered 2016-08-27: 0.5 mL via INTRAMUSCULAR

## 2016-08-27 MED ORDER — FENTANYL CITRATE (PF) 100 MCG/2ML IJ SOLN
100.0000 ug | Freq: Once | INTRAMUSCULAR | Status: AC
  Administered 2016-08-27: 100 ug via INTRAVENOUS

## 2016-08-27 MED ORDER — SODIUM CHLORIDE 0.9 % IV BOLUS (SEPSIS)
1000.0000 mL | Freq: Once | INTRAVENOUS | Status: AC
  Administered 2016-08-27: 1000 mL via INTRAVENOUS

## 2016-08-27 MED ORDER — CEFAZOLIN IN D5W 1 GM/50ML IV SOLN
1.0000 g | Freq: Once | INTRAVENOUS | Status: AC
  Administered 2016-08-27: 1 g via INTRAVENOUS
  Filled 2016-08-27: qty 50

## 2016-08-27 MED ORDER — MIDAZOLAM HCL 2 MG/2ML IJ SOLN
INTRAMUSCULAR | Status: AC
  Filled 2016-08-27: qty 2

## 2016-08-27 MED ORDER — 0.9 % SODIUM CHLORIDE (POUR BTL) OPTIME
TOPICAL | Status: DC | PRN
  Administered 2016-08-27: 1000 mL

## 2016-08-27 MED ORDER — LACTATED RINGERS IV SOLN
INTRAVENOUS | Status: DC | PRN
  Administered 2016-08-27: 15:00:00 via INTRAVENOUS

## 2016-08-27 MED ORDER — FENTANYL CITRATE (PF) 100 MCG/2ML IJ SOLN
INTRAMUSCULAR | Status: DC | PRN
  Administered 2016-08-27: 50 ug via INTRAVENOUS
  Administered 2016-08-27 (×2): 25 ug via INTRAVENOUS

## 2016-08-27 MED ORDER — ARTIFICIAL TEARS OP OINT
TOPICAL_OINTMENT | OPHTHALMIC | Status: AC
  Filled 2016-08-27: qty 3.5

## 2016-08-27 MED ORDER — PHENYLEPHRINE HCL 10 MG/ML IJ SOLN
INTRAMUSCULAR | Status: DC | PRN
  Administered 2016-08-27 (×3): 80 ug via INTRAVENOUS

## 2016-08-27 MED ORDER — CEFAZOLIN SODIUM 1 G IJ SOLR
INTRAMUSCULAR | Status: AC
  Filled 2016-08-27: qty 10

## 2016-08-27 MED ORDER — BACITRACIN ZINC 500 UNIT/GM EX OINT
TOPICAL_OINTMENT | CUTANEOUS | Status: AC
  Filled 2016-08-27: qty 1.8

## 2016-08-27 MED ORDER — ONDANSETRON HCL 4 MG/2ML IJ SOLN
4.0000 mg | Freq: Once | INTRAMUSCULAR | Status: AC
  Administered 2016-08-27: 4 mg via INTRAVENOUS
  Filled 2016-08-27: qty 2

## 2016-08-27 MED ORDER — FENTANYL CITRATE (PF) 100 MCG/2ML IJ SOLN
100.0000 ug | Freq: Once | INTRAMUSCULAR | Status: AC
  Administered 2016-08-27: 100 ug via INTRAVENOUS
  Filled 2016-08-27: qty 2

## 2016-08-27 MED ORDER — PROMETHAZINE HCL 25 MG/ML IJ SOLN: 6.2500 mg | INTRAMUSCULAR | Status: DC | PRN

## 2016-08-27 SURGICAL SUPPLY — 65 items
BANDAGE ELASTIC 3 VELCRO ST LF (GAUZE/BANDAGES/DRESSINGS) ×3 IMPLANT
BANDAGE ELASTIC 4 VELCRO ST LF (GAUZE/BANDAGES/DRESSINGS) ×6 IMPLANT
BIT DRILL 2 FAST STEP (BIT) ×3 IMPLANT
BIT DRILL 2.2 SS TIBIAL (BIT) ×3 IMPLANT
BNDG ESMARK 4X9 LF (GAUZE/BANDAGES/DRESSINGS) IMPLANT
BNDG GAUZE ELAST 4 BULKY (GAUZE/BANDAGES/DRESSINGS) ×3 IMPLANT
CANISTER SUCTION 1500CC (MISCELLANEOUS) ×3 IMPLANT
CHLORAPREP W/TINT 26ML (MISCELLANEOUS) IMPLANT
CORDS BIPOLAR (ELECTRODE) ×3 IMPLANT
COVER SURGICAL LIGHT HANDLE (MISCELLANEOUS) ×3 IMPLANT
CUFF TOURNIQUET SINGLE 18IN (TOURNIQUET CUFF) ×3 IMPLANT
CUFF TOURNIQUET SINGLE 24IN (TOURNIQUET CUFF) IMPLANT
DRAIN TLS ROUND 10FR (DRAIN) IMPLANT
DRAPE OEC MINIVIEW 54X84 (DRAPES) ×3 IMPLANT
DRAPE SURG 17X23 STRL (DRAPES) ×3 IMPLANT
GAUZE SPONGE 4X4 12PLY STRL (GAUZE/BANDAGES/DRESSINGS) ×3 IMPLANT
GAUZE XEROFORM 1X8 LF (GAUZE/BANDAGES/DRESSINGS) ×3 IMPLANT
GAUZE XEROFORM 5X9 LF (GAUZE/BANDAGES/DRESSINGS) ×3 IMPLANT
GLOVE BIOGEL M 8.0 STRL (GLOVE) ×3 IMPLANT
GOWN STRL REUS W/ TWL XL LVL3 (GOWN DISPOSABLE) ×2 IMPLANT
GOWN STRL REUS W/TWL XL LVL3 (GOWN DISPOSABLE) ×4
K-WIRE 1.6 (WIRE) ×4
K-WIRE FX5X1.6XNS BN SS (WIRE) ×2
KIT BASIN OR (CUSTOM PROCEDURE TRAY) ×3 IMPLANT
KWIRE FX5X1.6XNS BN SS (WIRE) ×2 IMPLANT
NEEDLE HYPO 22GX1.5 SAFETY (NEEDLE) IMPLANT
NS IRRIG 1000ML POUR BTL (IV SOLUTION) ×3 IMPLANT
PACK ORTHO EXTREMITY (CUSTOM PROCEDURE TRAY) ×3 IMPLANT
PAD CAST 3X4 CTTN HI CHSV (CAST SUPPLIES) IMPLANT
PAD CAST 4YDX4 CTTN HI CHSV (CAST SUPPLIES) ×1 IMPLANT
PADDING CAST ABS 4INX4YD NS (CAST SUPPLIES)
PADDING CAST ABS COTTON 4X4 ST (CAST SUPPLIES) IMPLANT
PADDING CAST COTTON 3X4 STRL (CAST SUPPLIES)
PADDING CAST COTTON 4X4 STRL (CAST SUPPLIES) ×2
PLATE DVR CROSSLOCK STD RT (Plate) ×3 IMPLANT
PLATE LOCKING 2.5 STRAIGHT (Plate) ×3 IMPLANT
SCREW 2.7X12MM (Screw) ×6 IMPLANT
SCREW LOCK 10X2.7X3 LD THRD (Screw) ×2 IMPLANT
SCREW LOCK 14X2.7X 3 LD TPR (Screw) ×2 IMPLANT
SCREW LOCK 16X2.7X 3 LD TPR (Screw) ×5 IMPLANT
SCREW LOCKING 2.7X10MM (Screw) ×4 IMPLANT
SCREW LOCKING 2.7X14 (Screw) ×4 IMPLANT
SCREW LOCKING 2.7X16 (Screw) ×10 IMPLANT
SCREW LOCKING 2.7X9MM (Screw) ×6 IMPLANT
SCREW MDTP 2.5X15MM (Screw) ×3 IMPLANT
SCREW PEG LOCK 2.5X13 (Screw) ×6 IMPLANT
SCREW PEG LOCK 2.5X15 (Orthopedic Implant) ×3 IMPLANT
SPLINT FIBERGLASS 3X35 (CAST SUPPLIES) ×3 IMPLANT
SPLINT PLASTER CAST XFAST 4X15 (CAST SUPPLIES) IMPLANT
SPLINT PLASTER XTRA FAST SET 4 (CAST SUPPLIES)
SUT ETHILON 3 0 PS 1 (SUTURE) IMPLANT
SUT ETHILON 4 0 PS 2 18 (SUTURE) IMPLANT
SUT MON AB 4-0 PC3 18 (SUTURE) ×6 IMPLANT
SUT VIC AB 3-0 PS1 18 (SUTURE)
SUT VIC AB 3-0 PS1 18XBRD (SUTURE) IMPLANT
SUT VIC AB 3-0 SH 27 (SUTURE) ×2
SUT VIC AB 3-0 SH 27X BRD (SUTURE) ×1 IMPLANT
SUT VICRYL 4-0 PS2 18IN ABS (SUTURE) IMPLANT
SYR BULB 3OZ (MISCELLANEOUS) IMPLANT
SYR CONTROL 10ML LL (SYRINGE) IMPLANT
TOWEL OR 17X24 6PK STRL BLUE (TOWEL DISPOSABLE) ×3 IMPLANT
TUBE CONNECTING 20'X1/4 (TUBING) ×1
TUBE CONNECTING 20X1/4 (TUBING) ×2 IMPLANT
UNDERPAD 30X30 (UNDERPADS AND DIAPERS) ×3 IMPLANT
WATER STERILE IRR 1000ML POUR (IV SOLUTION) IMPLANT

## 2016-08-27 NOTE — H&P (Signed)
Reason for Consult:Open Fracture Referring Physician: ER  CC:I fell while walking  HPI:  Diana Skinner is an 65 y.o. right handed female who presents with  Open fracture to R wrist after falling over sidewalk this am.  Pt seen at [facility], surgeon there evaluated and referred to [facility] .   Pain is rated at    8/10 and is described as sharp.  Pain is constant/intermittant.  Pain is made better by rest/immobilization, worse with motion.   Associated signs/symptoms: denies numbness Previous treatment:    Past Medical History:  Diagnosis Date  . Anxiety   . Arthritis   . High cholesterol   . HTN (hypertension)   . Migraines     Past Surgical History:  Procedure Laterality Date  . CESAREAN SECTION    . COLONOSCOPY  06/19/2012   Procedure: COLONOSCOPY;  Surgeon: Rogene Houston, MD;  Location: [facility];  Service: Endoscopy;  Laterality: N/A;  1030  . TONSILLECTOMY AND ADENOIDECTOMY      Family History  Problem Relation Age of Onset  . Heart disease      Social History:  reports that she has never smoked. She has never used smokeless tobacco. She reports that she drinks alcohol. She reports that she does not use drugs.  Allergies:  Allergies  Allergen Reactions  . Sulfonamide Derivatives Swelling    Medications: I have reviewed the patient's current medications.  Results for orders placed or performed during the hospital encounter of 08/27/16 (from the past 48 hour(s))  CBC with Differential     Status: None   Collection Time: 08/27/16  9:03 AM  Result Value Ref Range   WBC 8.0 4.0 - 10.5 K/uL   RBC 4.65 3.87 - 5.11 MIL/uL   Hemoglobin 14.4 12.0 - 15.0 g/dL   HCT 43.7 36.0 - 46.0 %   MCV 94.0 78.0 - 100.0 fL   MCH 31.0 26.0 - 34.0 pg   MCHC 33.0 30.0 - 36.0 g/dL   RDW 13.3 11.5 - 15.5 %   Platelets 301 150 - 400 K/uL   Neutrophils Relative % 54 %   Neutro Abs 4.3 1.7 - 7.7 K/uL   Lymphocytes Relative 34 %   Lymphs Abs 2.7 0.7 - 4.0 K/uL   Monocytes Relative 9 %   Monocytes Absolute 0.7 0.1 - 1.0 K/uL   Eosinophils Relative 3 %   Eosinophils Absolute 0.3 0.0 - 0.7 K/uL   Basophils Relative 0 %   Basophils Absolute 0.0 0.0 - 0.1 K/uL  Basic metabolic panel     Status: Abnormal   Collection Time: 08/27/16  9:03 AM  Result Value Ref Range   Sodium 135 135 - 145 mmol/L   Potassium 4.2 3.5 - 5.1 mmol/L   Chloride 105 101 - 111 mmol/L   CO2 25 22 - 32 mmol/L   Glucose, Bld 154 (H) 65 - 99 mg/dL   BUN 13 6 - 20 mg/dL   Creatinine, Ser 0.74 0.44 - 1.00 mg/dL   Calcium 8.7 (L) 8.9 - 10.3 mg/dL   GFR calc non Af Amer >60 >60 mL/min   GFR calc Af Amer >60 >60 mL/min    Comment: (NOTE) The eGFR has been calculated using the CKD EPI equation. This calculation has not been validated in all clinical situations. eGFR's persistently <60 mL/min signify possible Chronic Kidney Disease.    Anion gap 5 5 - 15    Dg Wrist Complete Right  Result Date: 08/27/2016 CLINICAL DATA:  Status post reduction of distal radius and ulna fractures. EXAM: RIGHT WRIST - COMPLETE 3+ VIEW COMPARISON:  Earlier today. FINDINGS: Interval fiberglass cast. Previously described comminuted fractures of the distal radius and ulna are again demonstrated. There is improved with significant residual radial and ventral angulation of the of the distal fragments. There is 1 shaft width of inferior displacement and overlapping of the fragments. IMPRESSION: Mildly improved position and alignment of the distal radius and ulna fractures with residual marked displacement, overlapping and angulation. Electronically Signed   By: Claudie Revering M.D.   On: 08/27/2016 10:49   Dg Wrist Complete Right  Result Date: 08/27/2016 CLINICAL DATA:  Fall, right wrist deformity EXAM: RIGHT WRIST - COMPLETE 3+ VIEW COMPARISON:  None. FINDINGS: Comminuted, displaced intra-articular distal radial fracture. Open, displaced, mildly comminuted distal radial shaft fracture. Wrist is radially and volarly displaced relative to  the forearm. Associated soft tissue gas. IMPRESSION: Comminuted, displaced, open distal radial and ulnar fractures, as described above. Electronically Signed   By: Julian Hy M.D.   On: 08/27/2016 09:41    Pertinent items are noted in HPI. Temp:  [97.4 F (36.3 C)-98.3 F (36.8 C)] 98.3 F (36.8 C) (09/24 1145) Pulse Rate:  [69-91] 84 (09/24 1145) Resp:  [16-18] 17 (09/24 1145) BP: (126-156)/(93-106) 142/104 (09/24 1145) SpO2:  [95 %-99 %] 96 % (09/24 1145) Weight:  [70.8 kg (156 lb)] 70.8 kg (156 lb) (09/24 0900) General appearance: alert and cooperative Resp: clear to auscultation bilaterally Cardio: regular rate and rhythm GI: soft, non-tender; bowel sounds normal; no masses,  no organomegaly Extremities: extremities normal, atraumatic, no cyanosis or edema Except for RUE - open volar laceration (fracture) grossly n/v intact, fingers cool to touch  Assessment: Open fracture R radius , ulan Plan: Needs emergent exploration, repair I have discussed this treatment plan in detail with patient, including the risks of the recommended treatment and surgery, and post op rehab.  I have also explained the seriousness of an open fracture and her particular fracture ( comminuted, intra-articular, open ) and the likelihood that she will lose some function of that wrist and may develop arthritis of that wrist. The patient understands that additional treatment may be necessary.  Tekela Garguilo CHRISTOPHER 08/27/2016, 1:31 PM

## 2016-08-27 NOTE — ED Notes (Signed)
Pt's rings removed and placed in denture cup, which pt has in her possession.  Unable to remove nail polish due to gel type.

## 2016-08-27 NOTE — Anesthesia Preprocedure Evaluation (Addendum)
Anesthesia Evaluation  Patient identified by MRN, date of birth, ID band Patient awake    Reviewed: Allergy & Precautions, NPO status , Patient's Chart, lab work & pertinent test results  Airway Mallampati: I       Dental  (+) Teeth Intact   Pulmonary neg pulmonary ROS,    breath sounds clear to auscultation       Cardiovascular hypertension, Pt. on medications  Rhythm:Regular Rate:Normal     Neuro/Psych  Headaches, Anxiety    GI/Hepatic negative GI ROS, Neg liver ROS,   Endo/Other  negative endocrine ROS  Renal/GU negative Renal ROS     Musculoskeletal  (+) Arthritis ,   Abdominal   Peds  Hematology negative hematology ROS (+)   Anesthesia Other Findings   Reproductive/Obstetrics                           Lab Results  Component Value Date   WBC 8.0 08/27/2016   HGB 14.4 08/27/2016   HCT 43.7 08/27/2016   MCV 94.0 08/27/2016   PLT 301 08/27/2016   Lab Results  Component Value Date   CREATININE 0.74 08/27/2016   BUN 13 08/27/2016   NA 135 08/27/2016   K 4.2 08/27/2016   CL 105 08/27/2016   CO2 25 08/27/2016    Anesthesia Physical Anesthesia Plan  ASA: II  Anesthesia Plan: General and Regional   Post-op Pain Management: GA combined w/ Regional for post-op pain   Induction: Intravenous  Airway Management Planned: Oral ETT  Additional Equipment:   Intra-op Plan:   Post-operative Plan: Extubation in OR  Informed Consent: I have reviewed the patients History and Physical, chart, labs and discussed the procedure including the risks, benefits and alternatives for the proposed anesthesia with the patient or authorized representative who has indicated his/her understanding and acceptance.   Dental advisory given  Plan Discussed with: CRNA  Anesthesia Plan Comments:         Anesthesia Quick Evaluation

## 2016-08-27 NOTE — ED Notes (Signed)
Orthoglass placed by Dr. Aline Brochure.  Pt tolerated well.  Cap refill remains brisk and sensation intact.

## 2016-08-27 NOTE — Op Note (Signed)
Diana Skinner, Diana Skinner               ACCOUNT NO.:  [id number]  MEDICAL RECORD NO.:  [id number]  LOCATION:  [facility]                         FACILITY:  [facility]  PHYSICIAN:  Dennie Bible, MD    DATE OF BIRTH:  June 05, 1951  DATE OF PROCEDURE:  08/27/2016 DATE OF DISCHARGE:  08/27/2016                              OPERATIVE REPORT   PREOPERATIVE DIAGNOSIS:  Grade 2 open fracture to the right distal radius and distal ulna.  POSTOPERATIVE DIAGNOSIS:  Grade 2 open fracture to the right distal radius and distal ulna.  PROCEDURE: 1. Open reduction and internal fixation of the right distal radius     with a volar plate. 2. Open reduction and internal fixation of the right distal ulna     metaphyseal-diaphyseal junction with an ALPS 2.5 mm plate.  ANESTHESIA:  General.  COMPLICATIONS:  No acute complications.  SPECIMENS:  None.  EBL:  Minimal.  TOURNIQUET TIME:  About an hour and a half.  INDICATIONS:  Ms. Guyse is a 65 year old female, fell walking today sustaining an open fracture.  She presented to [facility]  The Orthopedic surgeon there was uncomfortable with this fracture, so she was sent here for definitive care.  On evaluation, she had an open fracture, she had severely comminuted displaced distal radius and distal ulna fracture.  Risks, benefits, and alternatives of surgery were discussed with her.  These includes specifically the nature of her severe fracture, the open nature of it and risk of infection, and the potential loss of function from this severe fracture.  She agreed to proceed.  Consent was obtained.  DESCRIPTION OF PROCEDURE:  The patient was taken to the operating room, placed supine on the operating table.  Anesthesia was administered without difficulty.  Time-out was performed.  The right upper extremity was prepped and draped in normal sterile fashion with Betadine.  The arm was elevated and exsanguinated, the tourniquet was used on the upper  arm to 250 mmHg.  Initially, the dorsal 2 large puncture wounds were thoroughly irrigated with about a liter of saline solution.  It was quite evident that there was some dorsal comminution on the radius. Next, a volar incision was made over the FCR tendon, the interval between the FCR tendon and the radial artery was used, taken down to the pronator quadratus.  Pronator quadratus was taken down in an L shape fashion, exposing the fracture site.  The fracture site was comminuted intra-articular with multiple fragments.  This was reduced and appropriate size standard volar plate was chosen, it was held into place temporarily with K-wires while x-ray confirmed plate placement.  Next, the radial shaft screws were each drilled, measured and appropriate size screws placed.  Next, the distal portion of the plate screws were each drilled, measured and appropriate locking screws placed.  Final x-rays revealed near anatomic reduction.  There was some comminution posteriorly; however, overall alignment was satisfactory.  The distal ulna did return to a pretty nice position, however, was unstable and felt fixation was needed.  Next, the wrist was turned over the dorsal side and again thorough irrigation was performed.  There was a large bone fragment dorsally that was removed.  The fracture essentially ripped through the dorsal capsule.  The EPL tendon was entrapped in the fracture site.  This was removed and a large piece of bone was placed back into the defect dorsally and tapped into place.  The soft tissues around the capsule were reapproximated as best possible with Vicryl suture.  After more irrigation, this wound was closed loosely. Following an incision overlying the ulnar spine was made from the ulnar styloid distal approximately 8 cm.  The interval between the FCU and ECU was used to expose the fracture site.  The fracture site was distal, and there was a chip fracture of the very tip of  the ulnar styloid.  The fracture was reduced and an appropriate sized ALPS 2.5 mm titanium plate was chosen.  First the shaft screw was drilled, measured, and placed. This held the plate in place while the distal fragment was reduced and additional screws were placed.  Afterwards, there was nice reduction, good screw length and stable fixation.  Suture was placed in the tip of the ulnar styloid fragment in the periosteum more proximal to close down the defect here.  Again, irrigation of this site was performed.  The fascia was closed over the plate.  Subcutaneous tissue was closed, and then the skin was closed with a running 4-0 Monocryl.  On the volar aspect, I failed to mention the deep fascia was approximated with Vicryl suture and the skin was closed with a running 4-0 Monocryl.  After this, the tourniquet was released.  All fingers returned to nice pink color. A sterile dressing and sugar-tong splint were placed.  The patient tolerated the procedure well and was taken to the recovery room stable.     Dennie Bible, MD     HCC/MEDQ  D:  08/27/2016  T:  08/27/2016  Job:  [id number]

## 2016-08-27 NOTE — Progress Notes (Signed)
Orthopedic Tech Progress Note Patient Details:  Diana Skinner 07-Jun-1951 [id number]  Ortho Devices Type of Ortho Device: Arm sling   [place] Pink 08/27/2016, 4:57 PM

## 2016-08-27 NOTE — Transfer of Care (Signed)
Immediate Anesthesia Transfer of Care Note  Patient: Diana Skinner  Procedure(s) Performed: Procedure(s): OPEN REDUCTION INTERNAL FIXATION (ORIF) RADIAL FRACTURE/DISTAL (Right)  Patient Location: PACU  Anesthesia Type:General  Level of Consciousness: awake  Airway & Oxygen Therapy: Patient Spontanous Breathing  Post-op Assessment: Report given to RN and Post -op Vital signs reviewed and stable  Post vital signs: Reviewed and stable  Last Vitals:  Vitals:   08/27/16 1130 08/27/16 1145  BP: (!) 142/104 (!) 142/104  Pulse: 84 84  Resp:  17  Temp:  36.8 C    Last Pain:  Vitals:   08/27/16 1042  TempSrc:   PainSc: 1          Complications: No apparent anesthesia complications

## 2016-08-27 NOTE — Consult Note (Signed)
Reason for Consult: Open fracture right distal radius and ulna Referring Physician: Dr. Lacinda Axon ER physician  Diana Skinner is an 65 y.o. female.  HPI: 65 year old female with no history of osteoporosis was in her usual state of health was walking her morning exercise tripped over the elevated sidewalk and injured her right upper extremity. She went to the emergency room for evaluation. Clinical exam and x-rays show open fracture of the distal radius and ulna. Initial wounds appear to be grade 1 wounds. The radius is intra-articular comminuted and displaced, the ulna is displaced as well.  Surprisingly the patient's pain is well-controlled. She denies any numbness or tingling. She denies any family history of osteoporosis. She has not had any testing for osteoporosis. She denies being vitamin D or calcium deficient.  Date of injury is today 08/27/2016. Last meal was yesterday.  Past Medical History:  Diagnosis Date  . Anxiety   . Arthritis   . High cholesterol   . HTN (hypertension)   . Migraines     Past Surgical History:  Procedure Laterality Date  . CESAREAN SECTION    . COLONOSCOPY  06/19/2012   Procedure: COLONOSCOPY;  Surgeon: Rogene Houston, MD;  Location: [facility];  Service: Endoscopy;  Laterality: N/A;  1030  . TONSILLECTOMY AND ADENOIDECTOMY      Family History  Problem Relation Age of Onset  . Heart disease      Social History:  reports that she has never smoked. She has never used smokeless tobacco. She reports that she drinks alcohol. She reports that she does not use drugs.  Allergies:  Allergies  Allergen Reactions  . Sulfonamide Derivatives Swelling    Medications: I have reviewed the patient's current medications.   Current Facility-Administered Medications:  .  bacitracin 500 UNIT/GM ointment, , , ,  .  methylPREDNISolone acetate (DEPO-MEDROL) injection 40 mg, 40 mg, Intra-articular, Once, Carole Civil, MD  Current Outpatient Prescriptions:   .  losartan (COZAAR) 50 MG tablet, Take 50 mg by mouth daily., Disp: , Rfl:  .  omeprazole (PRILOSEC) 40 MG capsule, Take 40 mg by mouth once., Disp: , Rfl:  .  simvastatin (ZOCOR) 20 MG tablet, Take 20 mg by mouth daily., Disp: , Rfl:  .  venlafaxine (EFFEXOR) 75 MG tablet, Take 75 mg by mouth daily., Disp: , Rfl:    Results for orders placed or performed during the hospital encounter of 08/27/16 (from the past 48 hour(s))  CBC with Differential     Status: None   Collection Time: 08/27/16  9:03 AM  Result Value Ref Range   WBC 8.0 4.0 - 10.5 K/uL   RBC 4.65 3.87 - 5.11 MIL/uL   Hemoglobin 14.4 12.0 - 15.0 g/dL   HCT 43.7 36.0 - 46.0 %   MCV 94.0 78.0 - 100.0 fL   MCH 31.0 26.0 - 34.0 pg   MCHC 33.0 30.0 - 36.0 g/dL   RDW 13.3 11.5 - 15.5 %   Platelets 301 150 - 400 K/uL   Neutrophils Relative % 54 %   Neutro Abs 4.3 1.7 - 7.7 K/uL   Lymphocytes Relative 34 %   Lymphs Abs 2.7 0.7 - 4.0 K/uL   Monocytes Relative 9 %   Monocytes Absolute 0.7 0.1 - 1.0 K/uL   Eosinophils Relative 3 %   Eosinophils Absolute 0.3 0.0 - 0.7 K/uL   Basophils Relative 0 %   Basophils Absolute 0.0 0.0 - 0.1 K/uL  Basic metabolic panel  Status: Abnormal   Collection Time: 08/27/16  9:03 AM  Result Value Ref Range   Sodium 135 135 - 145 mmol/L   Potassium 4.2 3.5 - 5.1 mmol/L   Chloride 105 101 - 111 mmol/L   CO2 25 22 - 32 mmol/L   Glucose, Bld 154 (H) 65 - 99 mg/dL   BUN 13 6 - 20 mg/dL   Creatinine, Ser 0.74 0.44 - 1.00 mg/dL   Calcium 8.7 (L) 8.9 - 10.3 mg/dL   GFR calc non Af Amer >60 >60 mL/min   GFR calc Af Amer >60 >60 mL/min    Comment: (NOTE) The eGFR has been calculated using the CKD EPI equation. This calculation has not been validated in all clinical situations. eGFR's persistently <60 mL/min signify possible Chronic Kidney Disease.    Anion gap 5 5 - 15    Dg Wrist Complete Right  Result Date: 08/27/2016 CLINICAL DATA:  Fall, right wrist deformity EXAM: RIGHT WRIST -  COMPLETE 3+ VIEW COMPARISON:  None. FINDINGS: Comminuted, displaced intra-articular distal radial fracture. Open, displaced, mildly comminuted distal radial shaft fracture. Wrist is radially and volarly displaced relative to the forearm. Associated soft tissue gas. IMPRESSION: Comminuted, displaced, open distal radial and ulnar fractures, as described above. Electronically Signed   By: Julian Hy M.D.   On: 08/27/2016 09:41    Review of Systems  Constitutional: Negative for chills and fever.  Respiratory: Negative for shortness of breath.   Cardiovascular: Negative for chest pain.  Neurological: Negative for tingling and sensory change.   Blood pressure (!) 136/103, pulse 77, temperature 97.4 F (36.3 C), temperature source Oral, resp. rate 16, height 5' 7.5" (1.715 m), weight 156 lb (70.8 kg), SpO2 97 %. Physical Exam  Constitutional: She is oriented to person, place, and time. She appears well-developed and well-nourished. No distress.  HENT:  Head: Normocephalic and atraumatic.  Right Ear: External ear normal.  Left Ear: External ear normal.  Eyes: Pupils are equal, round, and reactive to light. Right eye exhibits no discharge. Left eye exhibits no discharge. No scleral icterus.  Neck: Normal range of motion. Neck supple. No JVD present. No tracheal deviation present. No thyromegaly present.  Cardiovascular: Normal rate and regular rhythm.   Respiratory: Effort normal and breath sounds normal. No respiratory distress. She has no wheezes.  GI: Soft. She exhibits no distension. There is no tenderness.  Musculoskeletal:  All long bones were palpated. Left upper extremity and both lower extremities as well as a right upper extremity up to the elbow area were nontender collarbone nontender  Pelvis stable. Normal range of motion right and left hip  There are 2 abrasions one on the left knee 1 over the right knee these were superficial cleaned with saline and dressed appropriately  As  far as the right wrist was in extreme altered deviation both bones are protruding through there is tenderness at both fracture sites. The lacerations or wounds were noted to be approximately 1 cm.  She can move her fingers. Elbow was nontender muscle tone was normal the joint itself could not be tested for stability  Radial pulses normal and perfusion of the wrist is normal    Lymphadenopathy:    She has no cervical adenopathy.  Neurological: She is alert and oriented to person, place, and time. She exhibits normal muscle tone.  Gross sensory exam shows normal soft touch in the hand  Skin: Skin is warm and dry. She is not diaphoretic.  2 open wounds over  the radius 1 over the ulna with bone protruding through  Psychiatric: She has a normal mood and affect. Her behavior is normal. Judgment and thought content normal.    Assessment/Plan: Initial plain films show a comminuted intra-articular distal radius fracture with severe ulnar deviation and a distal ulnar fracture also with displacement and angulation.  I have reset the bone after dressing the wound with saline she is artery gotten her IV antibiotics  The postreduction films show improved alignment of the hand related to the forearm  ER consultation level III Closed reduction open fracture distal radius and ulna  Informed by ER physician Dr. Lenon Curt is busy all day and he recommends sending the patient to [facility] 08/27/2016, 10:32 AM

## 2016-08-27 NOTE — Anesthesia Postprocedure Evaluation (Signed)
Anesthesia Post Note  Patient: Diana Skinner  Procedure(s) Performed: Procedure(s) (LRB): OPEN REDUCTION INTERNAL FIXATION (ORIF) RADIAL FRACTURE/DISTAL (Right)  Patient location during evaluation: PACU Anesthesia Type: General and Regional Level of consciousness: awake and alert Pain management: pain level controlled Vital Signs Assessment: post-procedure vital signs reviewed and stable Respiratory status: spontaneous breathing, nonlabored ventilation, respiratory function stable and patient connected to nasal cannula oxygen Cardiovascular status: blood pressure returned to baseline and stable Postop Assessment: no signs of nausea or vomiting Anesthetic complications: no    Last Vitals:  Vitals:   08/27/16 1630 08/27/16 1642  BP: (!) 116/94 109/84  Pulse:    Resp:    Temp:  36.4 C    Last Pain:  Vitals:   08/27/16 1600  TempSrc:   PainSc: 0-No pain                 Tiajuana Amass

## 2016-08-27 NOTE — Anesthesia Procedure Notes (Signed)
Procedure Name: LMA Insertion Date/Time: 08/27/2016 1:58 PM Performed by: Rush Farmer E Pre-anesthesia Checklist: Patient identified, Emergency Drugs available, Suction available and Patient being monitored Patient Re-evaluated:Patient Re-evaluated prior to inductionOxygen Delivery Method: Circle system utilized Preoxygenation: Pre-oxygenation with 100% oxygen Intubation Type: IV induction Ventilation: Mask ventilation without difficulty LMA: LMA inserted LMA Size: 4.0 Number of attempts: 1 Placement Confirmation: positive ETCO2 and breath sounds checked- equal and bilateral Tube secured with: Tape Dental Injury: Teeth and Oropharynx as per pre-operative assessment

## 2016-08-27 NOTE — ED Provider Notes (Signed)
[place] DEPT Provider Note   CSN: [id number] Arrival date & time: 08/27/16  0847   By signing my name below, I, Diana Skinner, attest that this documentation has been prepared under the direction and in the presence of Nat Christen, MD . Electronically Signed: Dolores Skinner, Scribe. 08/27/2016. 9:03 AM.  History   Chief Complaint Chief Complaint  Patient presents with  . Wrist Pain  LEVEL 5 CAVEAT FOR URGENT NEED FOR INTERVENTION The history is provided by the patient. No language interpreter was used.    HPI Comments:  Diana Skinner is a 65 y.o. female with PMHx of HTN and HLD who presents to the Emergency Department complaining of sudden-onset constant unchanged right wrist injurt occurring earlier today. Pt states she was walking when she tripped, and fell, injuring her right wrist. She denies and head or neck injury, or LOC. Severity is Severe.   Past Medical History:  Diagnosis Date  . Anxiety   . Arthritis   . High cholesterol   . HTN (hypertension)   . Migraines     Patient Active Problem List   Diagnosis Date Noted  . Fracture of right wrist 08/27/2016  . Bursitis of knee 05/30/2011  . KNEE PAIN 01/10/2010  . CLOSED FRACTURE OF UPPER END OF TIBIA 01/10/2010  . DERANGEMENT MENISCUS 01/03/2010    Past Surgical History:  Procedure Laterality Date  . CESAREAN SECTION    . COLONOSCOPY  06/19/2012   Procedure: COLONOSCOPY;  Surgeon: Rogene Houston, MD;  [facility];  Service: Endoscopy;  Laterality: N/A;  1030  . TONSILLECTOMY AND ADENOIDECTOMY      OB History    No data available       Home Medications    Prior to Admission medications   Medication Sig Start Date End Date Taking? Authorizing Provider  albuterol (PROVENTIL HFA;VENTOLIN HFA) 108 (90 Base) MCG/ACT inhaler Inhale 2 puffs into the lungs every 6 (six) hours as needed for wheezing or shortness of breath.   Yes Historical Provider, MD  losartan (COZAAR) 50 MG tablet Take 50 mg by  mouth daily.   Yes Historical Provider, MD  naproxen sodium (ALEVE) 220 MG tablet Take 440 mg by mouth 2 (two) times daily as needed (pain).   Yes Historical Provider, MD  Omega-3 Fatty Acids (FISH OIL) 1000 MG CAPS Take 2 capsules by mouth daily.   Yes Historical Provider, MD  omeprazole (PRILOSEC) 40 MG capsule Take 40 mg by mouth once.   Yes Historical Provider, MD  Polyethyl Glycol-Propyl Glycol (SYSTANE OP) Apply 1 drop to eye daily as needed (dry eyes).   Yes Historical Provider, MD  pravastatin (PRAVACHOL) 10 MG tablet Take 1 tablet by mouth daily. 08/08/16  Yes Historical Provider, MD  venlafaxine XR (EFFEXOR-XR) 75 MG 24 hr capsule Take 1 capsule by mouth daily. 08/24/16  Yes Historical Provider, MD    Family History Family History  Problem Relation Age of Onset  . Heart disease      Social History Social History  Substance Use Topics  . Smoking status: Never Smoker  . Smokeless tobacco: Never Used  . Alcohol use Yes     Comment: occasional     Allergies   Sulfonamide derivatives   Review of Systems Review of Systems  Unable to perform ROS: Acuity of condition  Musculoskeletal: Positive for arthralgias. Negative for neck pain.  Neurological: Negative for headaches.     Physical Exam Updated Vital Signs BP (!) 148/106   Pulse  73   Temp 97.4 F (36.3 C) (Oral)   Resp 16   Ht 5' 7.5" (1.715 m)   Wt 156 lb (70.8 kg)   SpO2 99%   BMI 24.07 kg/m   Physical Exam  Constitutional: She is oriented to person, place, and time. She appears well-developed and well-nourished.  HENT:  Head: Normocephalic and atraumatic.  Eyes: Conjunctivae are normal.  Neck: Neck supple.  Cardiovascular: Normal rate and regular rhythm.   Pulmonary/Chest: Effort normal and breath sounds normal.  Abdominal: Soft. Bowel sounds are normal.  Musculoskeletal: She exhibits edema, tenderness and deformity.  RUE: Displaced angulated, open fracture of right wrist. She was able to wiggle her  fingers.   Neurological: She is alert and oriented to person, place, and time.  Skin: Skin is warm and dry.  Psychiatric: She has a normal mood and affect. Her behavior is normal.  Nursing note and vitals reviewed.    ED Treatments / Results  DIAGNOSTIC STUDIES:  Oxygen Saturation is 95% on RA, normal by my interpretation.    Labs (all labs ordered are listed, but only abnormal results are displayed) Labs Reviewed  BASIC METABOLIC PANEL - Abnormal; Notable for the following:       Result Value   Glucose, Bld 154 (*)    Calcium 8.7 (*)    All other components within normal limits  CBC WITH DIFFERENTIAL/PLATELET    EKG  EKG Interpretation None       Radiology Dg Wrist Complete Right  Result Date: 08/27/2016 CLINICAL DATA:  Status post reduction of distal radius and ulna fractures. EXAM: RIGHT WRIST - COMPLETE 3+ VIEW COMPARISON:  Earlier today. FINDINGS: Interval fiberglass cast. Previously described comminuted fractures of the distal radius and ulna are again demonstrated. There is improved with significant residual radial and ventral angulation of the of the distal fragments. There is 1 shaft width of inferior displacement and overlapping of the fragments. IMPRESSION: Mildly improved position and alignment of the distal radius and ulna fractures with residual marked displacement, overlapping and angulation. Electronically Signed   By: Claudie Revering M.D.   On: 08/27/2016 10:49   Dg Wrist Complete Right  Result Date: 08/27/2016 CLINICAL DATA:  Fall, right wrist deformity EXAM: RIGHT WRIST - COMPLETE 3+ VIEW COMPARISON:  None. FINDINGS: Comminuted, displaced intra-articular distal radial fracture. Open, displaced, mildly comminuted distal radial shaft fracture. Wrist is radially and volarly displaced relative to the forearm. Associated soft tissue gas. IMPRESSION: Comminuted, displaced, open distal radial and ulnar fractures, as described above. Electronically Signed   By: Julian Hy M.D.   On: 08/27/2016 09:41    Procedures Procedures (including critical care time)  Medications Ordered in ED Medications  bacitracin 500 UNIT/GM ointment (not administered)  sodium chloride 0.9 % bolus 1,000 mL (1,000 mLs Intravenous New Bag/Given 08/27/16 0914)  ondansetron (ZOFRAN) injection 4 mg (4 mg Intravenous Given 08/27/16 0914)  ceFAZolin (ANCEF) IVPB 1 g/50 mL premix (0 g Intravenous Stopped 08/27/16 0937)  fentaNYL (SUBLIMAZE) injection 100 mcg (100 mcg Intravenous Given 08/27/16 0904)  Tdap (BOOSTRIX) injection 0.5 mL (0.5 mLs Intramuscular Given 08/27/16 0921)     Initial Impression / Assessment and Plan / ED Course  I have reviewed the triage vital signs and the nursing notes.  Pertinent labs & imaging results that were available during my care of the patient were reviewed by me and considered in my medical decision making (see chart for details).  Clinical Course   COORDINATION OF CARE:  9:02  AM Discussed treatment plan with pt at bedside which included Xray of wrist, Tetanus update, IV abx, pain medication, and orthopedics consult and pt agreed to plan.   Discussed with Dr. Aline Brochure. He will splint the wrist. Also discussed with hand surgeon Dr. Lenon Curt.  She'll be transferred to [facility] for further evaluation.  Final Clinical Impressions(s) / ED Diagnoses   Final diagnoses:  Right wrist fracture, open, initial encounter    New Prescriptions New Prescriptions   No medications on file  I personally performed the services described in this documentation, which was scribed in my presence. The recorded information has been reviewed and is accurate.      Nat Christen, MD 08/27/16 1120

## 2016-08-27 NOTE — Anesthesia Procedure Notes (Signed)
Anesthesia Regional Block:  Supraclavicular block  Pre-Anesthetic Checklist: ,, timeout performed, Correct Patient, Correct Site, Correct Laterality, Correct Procedure, Correct Position, site marked, Risks and benefits discussed,  Surgical consent,  Pre-op evaluation,  At surgeon's request and post-op pain management  Laterality: Right  Prep: chloraprep       Needles:  Injection technique: Single-shot  Needle Type: Echogenic Stimulator Needle     Needle Length: 9cm 9 cm Needle Gauge: 21 G    Additional Needles:  Procedures: ultrasound guided (picture in chart) and nerve stimulator Supraclavicular block  Nerve Stimulator or Paresthesia:  Response: deltoid and biceps, 0.5 mA,   Additional Responses:   Narrative:  Start time: 08/27/2016 1:28 PM End time: 08/27/2016 1:36 PM Injection made incrementally with aspirations every 5 mL.  Performed by: Personally  Anesthesiologist: Suzette Battiest  Additional Notes: Risks, benefits and alternative to block explained extensively.  Patient tolerated procedure well, without complications.

## 2016-08-27 NOTE — Discharge Instructions (Signed)
Discharge Instructions:  Keep your dressing clean, dry and in place until instructed to remove by Dr. Coley.  If the dressing becomes dirty or wet call the office for instructions during business hours. Elevate the extremity to help with swelling, this will also help with any discomfort. Take your medication as prescribed. No lifting with the injured  extremity. If you feel that the dressing is too tight, you may loosen it, but keep it on; finger tips should be pink; if there is a concern, call the office. [phone] Ice may be used if the injury is a fracture, do not apply ice directly to the skin. Please call the office on the next business day after discharge to arrange a follow up appointment.  Call [phone] between the hours of 9am - 5pm M-Th or 9am - 1pm on Fri. For most hand injuries and/or conditions, you may return to work using the uninjured hand (one handed duty) within 24-72 hours.  A detailed note will be provided to you at your follow up appointment or may contact the office prior to your follow up.    

## 2016-08-27 NOTE — ED Provider Notes (Signed)
Patient arrived in the unit, before I was able to see the patient she was brought to the OR.   Haset [place] Julio Alm, MD 08/27/16 1253

## 2016-08-27 NOTE — ED Notes (Signed)
Care turned over to [facility].  Report called to [facility] ED.

## 2016-08-27 NOTE — ED Triage Notes (Signed)
Pt fell while walking. Open fracture to right wrist.

## 2016-08-29 ENCOUNTER — Encounter (HOSPITAL_COMMUNITY): Payer: Self-pay | Admitting: General Surgery

## 2016-09-05 NOTE — Discharge Summary (Signed)
Diana, Skinner               ACCOUNT NO.:  [id number]  MEDICAL RECORD NO.:  [id number]  LOCATION:  [facility]                         FACILITY:  [facility]  PHYSICIAN:  Dennie Bible, MD    DATE OF BIRTH:  05-24-1951  DATE OF ADMISSION:  08/27/2016 DATE OF DISCHARGE:  08/27/2016                              DISCHARGE SUMMARY   ADMITTING DIAGNOSIS:  Grade 2 open fracture of the right distal radius and distal ulna.  DISCHARGE DIAGNOSIS:  Grade 2 open fracture of the right distal radius and distal ulna.  PROCEDURE PERFORMED:  Please see the operative note.  Open reduction, internal fixation of both the right distal radius and the right distal ulna.  HOSPITAL COURSE:  Diana Skinner actually initially entered the [facility] with an open fracture of her right wrist.  I was consulted and the patient was transferred to [facility] for surgical care.  On arrival to [facility], she was taken to the operating room.  Please see the operative note for details.  After surgery, the patient was felt stable for discharge.  She was discharged from the PACU and sent home with adequate followup and care instructions.     Dennie Bible, MD     HCC/MEDQ  D:  09/04/2016  T:  09/05/2016  Job:  [id number]

## 2017-09-26 ENCOUNTER — Ambulatory Visit (INDEPENDENT_AMBULATORY_CARE_PROVIDER_SITE_OTHER): Payer: BLUE CROSS/BLUE SHIELD

## 2017-09-26 ENCOUNTER — Ambulatory Visit (INDEPENDENT_AMBULATORY_CARE_PROVIDER_SITE_OTHER): Payer: BLUE CROSS/BLUE SHIELD | Admitting: Orthopedic Surgery

## 2017-09-26 ENCOUNTER — Encounter: Payer: Self-pay | Admitting: Orthopedic Surgery

## 2017-09-26 ENCOUNTER — Ambulatory Visit: Payer: BLUE CROSS/BLUE SHIELD

## 2017-09-26 VITALS — BP 132/88 | HR 86 | Ht 67.0 in | Wt 180.0 lb

## 2017-09-26 DIAGNOSIS — M25561 Pain in right knee: Secondary | ICD-10-CM

## 2017-09-26 DIAGNOSIS — M25562 Pain in left knee: Secondary | ICD-10-CM

## 2017-09-26 DIAGNOSIS — M170 Bilateral primary osteoarthritis of knee: Secondary | ICD-10-CM | POA: Diagnosis not present

## 2017-09-26 MED ORDER — METHYLPREDNISOLONE ACETATE 40 MG/ML IJ SUSP
40.0000 mg | Freq: Once | INTRAMUSCULAR | Status: AC
  Administered 2017-09-26: 40 mg via INTRA_ARTICULAR

## 2017-09-26 MED ORDER — DICLOFENAC POTASSIUM 50 MG PO TABS: 50.0000 mg | ORAL_TABLET | Freq: Two times a day (BID) | ORAL | 1 refills | Status: DC

## 2017-09-26 NOTE — Patient Instructions (Signed)
You will take the diclofenac every day for 2 weeks (this is for inflammation and pain) take it with food so it does not upset your stomach, after 2 weeks, you can just use this medication as needed.

## 2017-09-26 NOTE — Progress Notes (Addendum)
NEW PATIENT OFFICE VISIT    Chief Complaint  Patient presents with  . Knee Pain    bilateral knees painful left is worse than right, has left knee swelling     66 years old previously seen for osteoarthritis of the knee presents withpain and swelling left knee bilateral knee pain oh trauma. She does walk 3 miles a day at the stop the walking secondary to the swelling. The pain is constant and is on the medial side it's dull it's aching appears to be activity and Related    Review of Systems  Constitutional: Negative for chills and fever.  Skin: Negative for rash.  Neurological: Negative for tingling and sensory change.     Past Medical History:  Diagnosis Date  . Anxiety   . Arthritis   . High cholesterol   . HTN (hypertension)   . Migraines     Past Surgical History:  Procedure Laterality Date  . CESAREAN SECTION    . COLONOSCOPY  06/19/2012   Procedure: COLONOSCOPY;  Surgeon: Rogene Houston, MD;  [facility];  Service: Endoscopy;  Laterality: N/A;  1030  . ORIF RADIAL FRACTURE Right 08/27/2016   Procedure: OPEN REDUCTION INTERNAL FIXATION (ORIF) RADIAL FRACTURE/DISTAL;  Surgeon: Dayna Barker, MD;  Location: [place];  Service: Plastics;  Laterality: Right;  . TONSILLECTOMY AND ADENOIDECTOMY      Family History  Problem Relation Age of Onset  . Heart disease Unknown    Social History  Substance Use Topics  . Smoking status: Never Smoker  . Smokeless tobacco: Never Used  . Alcohol use Yes     Comment: occasional    Allergies  Allergen Reactions  . Sulfonamide Derivatives Swelling     No outpatient prescriptions have been marked as taking for the 09/26/17 encounter (Office Visit) with Carole Civil, MD.    BP 132/88   Pulse 86   Ht 5\' 7"  (1.702 m)   Wt 180 lb (81.6 kg)   BMI 28.19 kg/m   Physical Exam  Constitutional: She is oriented to person, place, and time. She appears well-developed and well-nourished.  Musculoskeletal:   Left knee: She exhibits effusion.  Neurological: She is alert and oriented to person, place, and time.  Psychiatric: She has a normal mood and affect. Judgment normal.  Vitals reviewed.   Left Knee Exam   Muscle Strength  Normal left knee strength  Right Knee Exam   Muscle Strength  Normal right knee strength    Right Knee Exam   Tenderness  The patient is experiencing tenderness in the medial joint line.  Range of Motion  Extension: normal  Flexion: normal   Muscle Strength   The patient has normal right knee strength.  Tests  McMurray:  Medial - negative  Drawer:       Anterior - negative      Other  Erythema: absent Scars: absent Sensation: normal Pulse: present Swelling: none   Left Knee Exam   Tenderness  The patient is experiencing tenderness in the medial joint line.  Range of Motion  Extension: normal  Flexion: normal   Muscle Strength   The patient has normal left knee strength.  Tests  McMurray:  Medial - negative  Drawer:       Anterior - negative       Other  Erythema: present Scars: absent Sensation: normal Pulse: present Swelling: mild Effusion: effusion present     Meds ordered this encounter  Medications  . levothyroxine (  SYNTHROID, LEVOTHROID) 50 MCG tablet    Refill:  2  . methylPREDNISolone acetate (DEPO-MEDROL) injection 40 mg  . diclofenac (CATAFLAM) 50 MG tablet    Sig: Take 1 tablet (50 mg total) by mouth 2 (two) times daily.    Dispense:  60 tablet    Refill:  1    Plain films of the left and right knee show osteoarthritis with symmetric joint space narrowing of both knees with mild patellofemoral arthritis. Arthritis is graded as moderate based on the amount of narrowing in both hemi-joint  Encounter Diagnosis  Name Primary?  . Left knee pain, unspecified chronicity Yes     PLAN:   Recommend anti-inflammatories for 2 weeks and then on an intermittent basis and follow-up in 6 weeks  I aspirated and  injected her left knee  Procedure note injection and aspiration left knee joint  Verbal consent was obtained to aspirate and inject the left knee joint   Timeout was completed to confirm the site of aspiration and injection  An 18-gauge needle was used to aspirate the left knee joint from a suprapatellar lateral approach.  The medications used were 40 mg of Depo-Medrol and 1% lidocaine 3 cc  Anesthesia was provided by ethyl chloride and the skin was prepped with alcohol.  After cleaning the skin with alcohol an 18-gauge needle was used to aspirate the right knee joint.  We obtained 5 cc of fluid  We followed this by injection of 40 mg of Depo-Medrol and 3 cc 1% lidocaine.  There were no complications. A sterile bandage was applied.

## 2017-10-04 IMAGING — CR DG WRIST COMPLETE 3+V*R*
3 series · 3 of 3 positions shown · non-contrast
Comparison: Earlier today.

CLINICAL DATA: Status post reduction of distal radius and ulna
fractures.

EXAM:
RIGHT WRIST - COMPLETE 3+ VIEW

[pa]
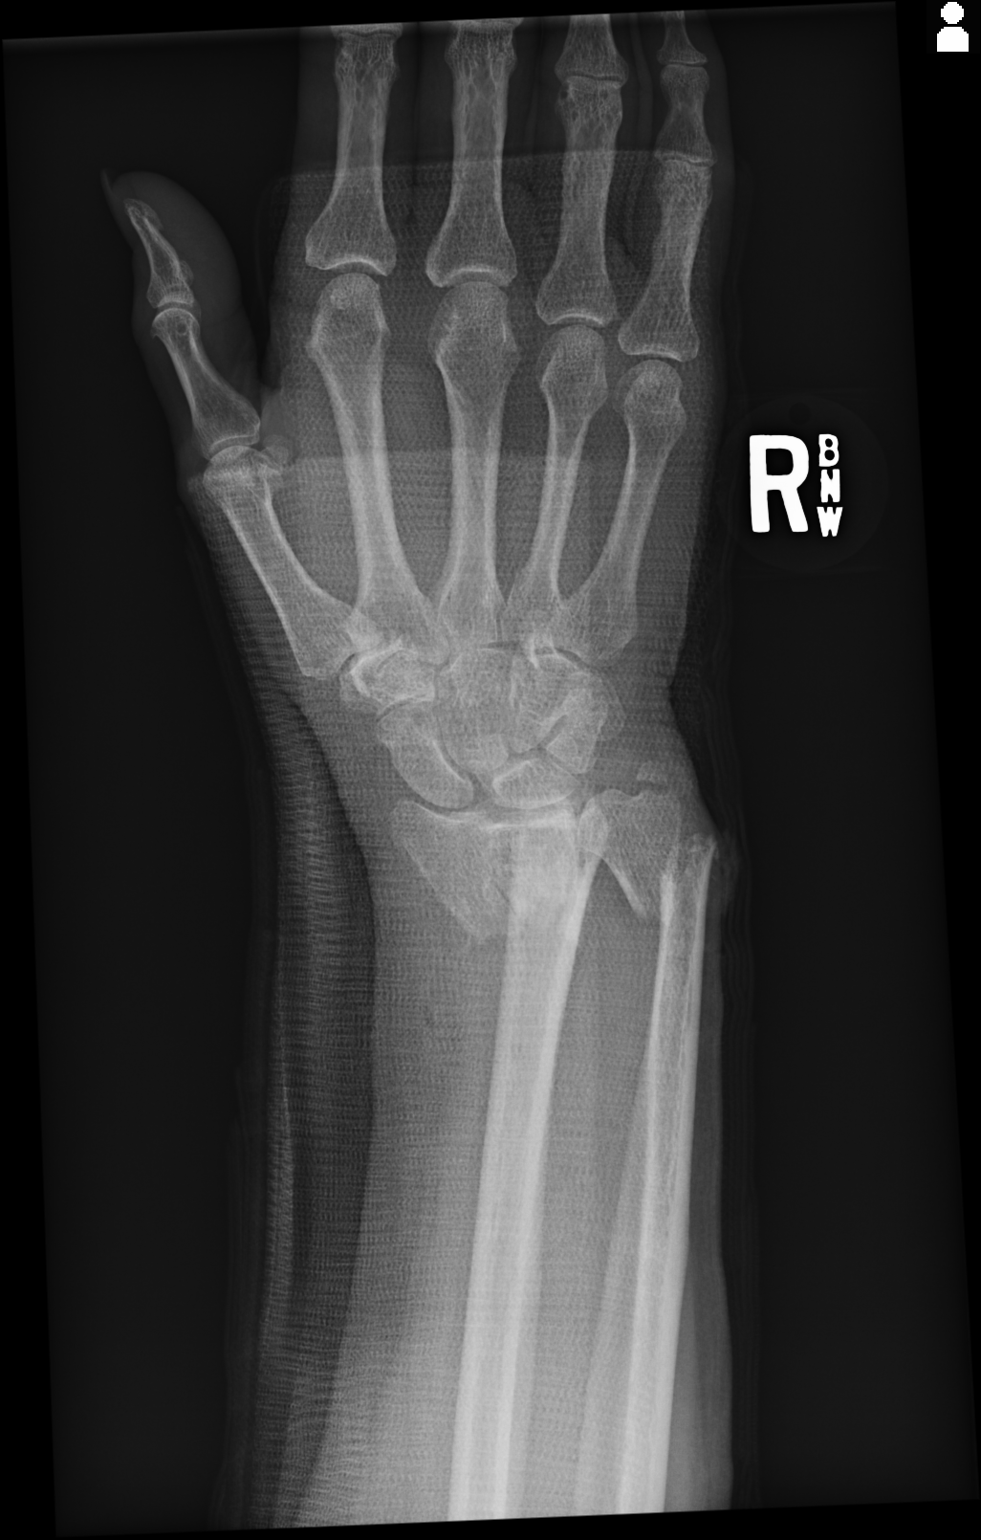

[oblique]
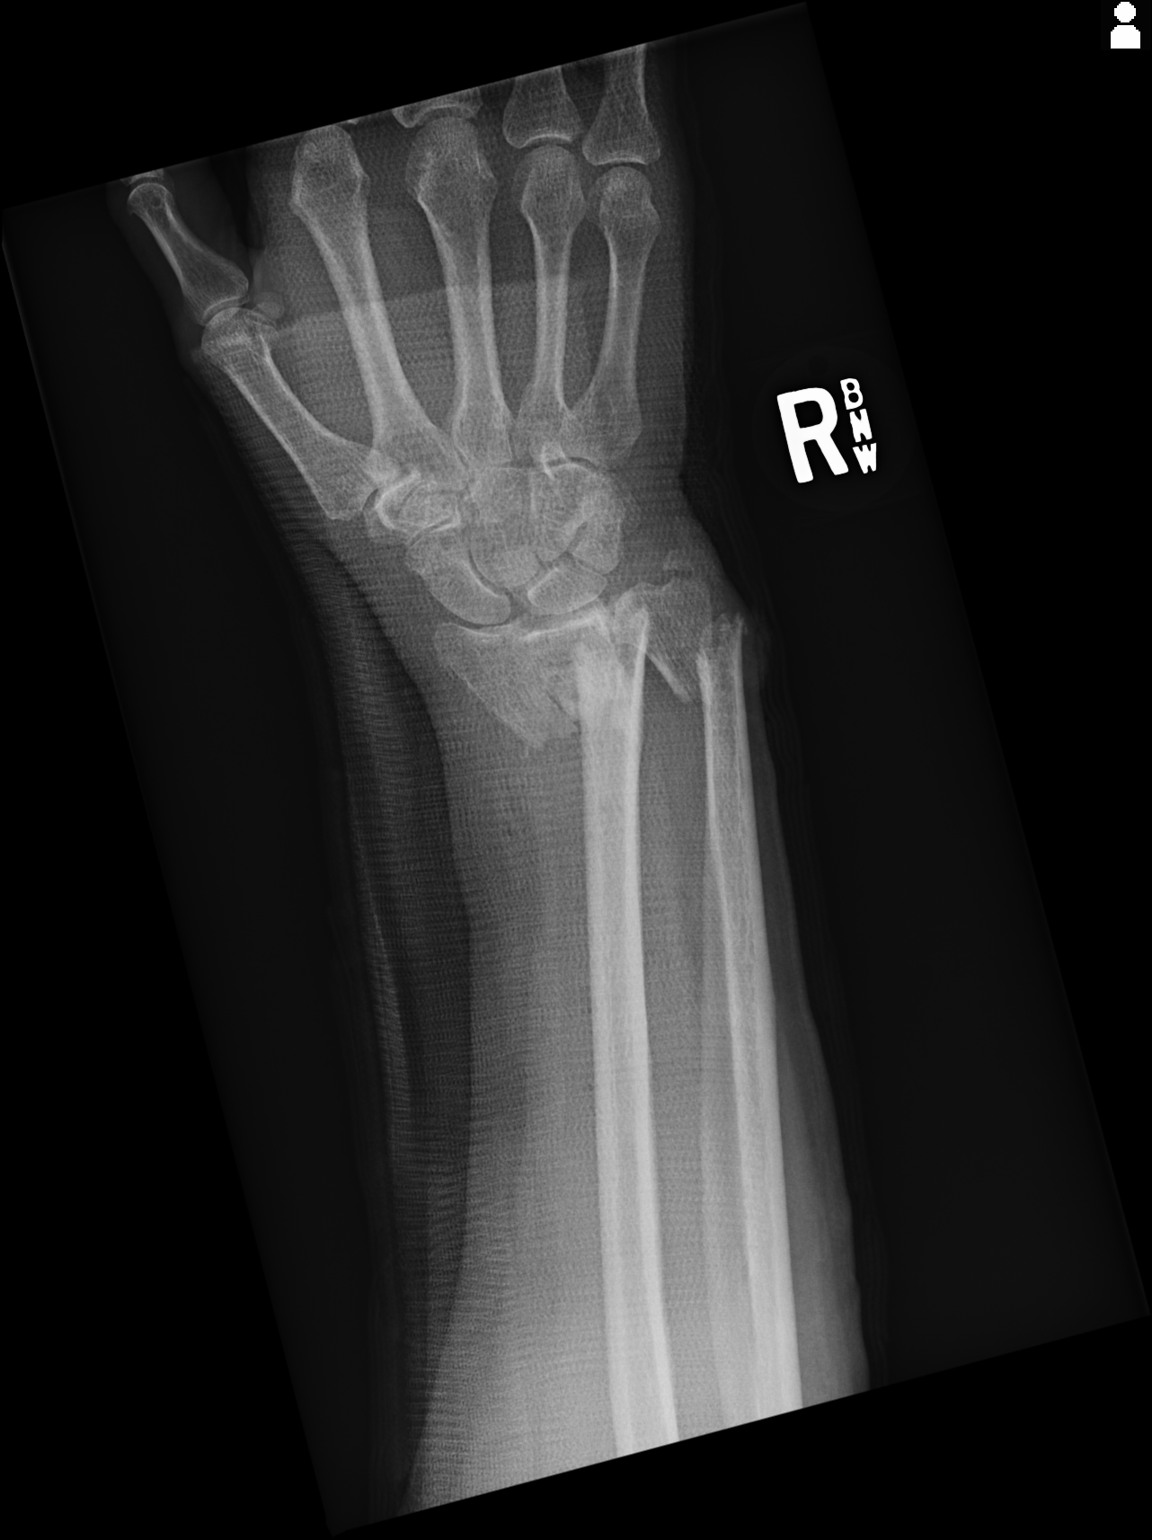

[lat]
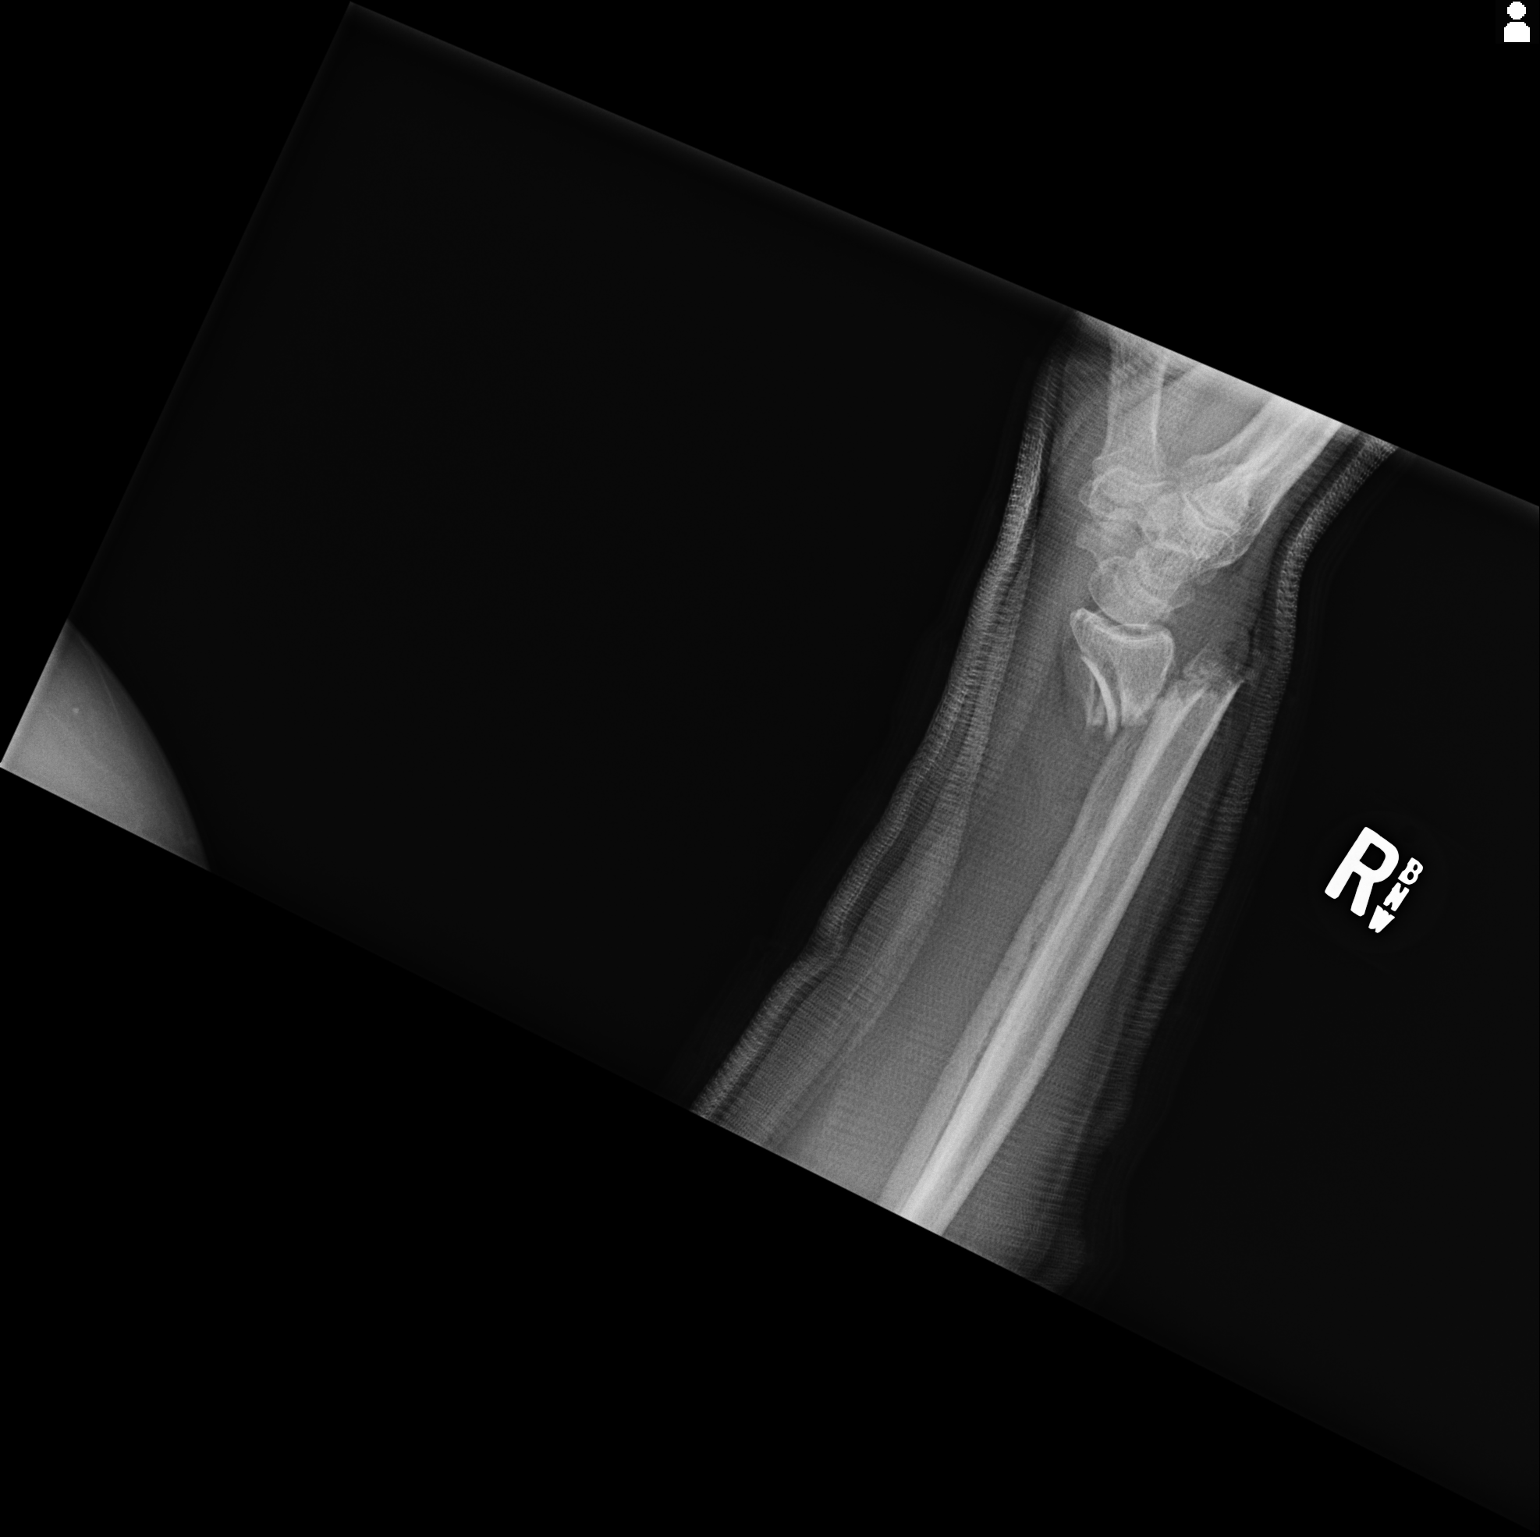

[3 of 3 positions shown; findings below may reference images not displayed]

FINDINGS: Interval fiberglass cast. Previously described comminuted fractures
of the distal radius and ulna are again demonstrated. There is
improved with significant residual radial and ventral angulation of
the of the distal fragments. There is 1 shaft width of inferior
displacement and overlapping of the fragments.
IMPRESSION: Mildly improved position and alignment of the distal radius and ulna
fractures with residual marked displacement, overlapping and
angulation.

## 2017-11-07 ENCOUNTER — Ambulatory Visit (INDEPENDENT_AMBULATORY_CARE_PROVIDER_SITE_OTHER): Payer: BLUE CROSS/BLUE SHIELD | Admitting: Orthopedic Surgery

## 2017-11-07 ENCOUNTER — Encounter: Payer: Self-pay | Admitting: Orthopedic Surgery

## 2017-11-07 VITALS — BP 127/91 | HR 71 | Ht 67.0 in | Wt 181.0 lb

## 2017-11-07 DIAGNOSIS — M25561 Pain in right knee: Secondary | ICD-10-CM | POA: Diagnosis not present

## 2017-11-07 DIAGNOSIS — M170 Bilateral primary osteoarthritis of knee: Secondary | ICD-10-CM

## 2017-11-07 DIAGNOSIS — M25562 Pain in left knee: Secondary | ICD-10-CM

## 2017-11-07 MED ORDER — DICLOFENAC POTASSIUM 50 MG PO TABS: 50.0000 mg | ORAL_TABLET | Freq: Two times a day (BID) | ORAL | 1 refills | Status: DC

## 2017-11-07 NOTE — Progress Notes (Signed)
Progress Note   Patient ID: Diana Skinner, female   DOB: 06/30/1951, 66 y.o.   MRN: [id number]  Chief Complaint  Patient presents with  . Follow-up    LEFT KNEE PAIN    66 year old female presents for reevaluation of her left knee and right knee for that matter left knee was worse.  She took diclofenac for 2 weeks and then takes it intermittently and has noticed decrease knee pain decreased swelling improved walking     Review of Systems  Musculoskeletal:       She denies any mechanical symptoms of catching locking or giving way   Current Meds  Medication Sig  . albuterol (PROVENTIL HFA;VENTOLIN HFA) 108 (90 Base) MCG/ACT inhaler Inhale 2 puffs into the lungs every 6 (six) hours as needed for wheezing or shortness of breath.  . diclofenac (CATAFLAM) 50 MG tablet Take 1 tablet (50 mg total) by mouth 2 (two) times daily.  Marland Kitchen levothyroxine (SYNTHROID, LEVOTHROID) 50 MCG tablet   . losartan (COZAAR) 50 MG tablet Take 50 mg by mouth daily.  . naproxen sodium (ALEVE) 220 MG tablet Take 440 mg by mouth 2 (two) times daily as needed (pain).  . Omega-3 Fatty Acids (FISH OIL) 1000 MG CAPS Take 2 capsules by mouth daily.  Marland Kitchen omeprazole (PRILOSEC) 40 MG capsule Take 40 mg by mouth once.  Vladimir Faster Glycol-Propyl Glycol (SYSTANE OP) Apply 1 drop to eye daily as needed (dry eyes).  . pravastatin (PRAVACHOL) 10 MG tablet Take 1 tablet by mouth daily.  Marland Kitchen venlafaxine XR (EFFEXOR-XR) 75 MG 24 hr capsule Take 1 capsule by mouth daily.    Allergies  Allergen Reactions  . Sulfonamide Derivatives Swelling     BP (!) 127/91   Pulse 71   Ht 5\' 7"  (1.702 m)   Wt 181 lb (82.1 kg)   BMI 28.35 kg/m   Physical Exam  Constitutional: She is oriented to person, place, and time. She appears well-developed and well-nourished.  Musculoskeletal:       Left knee: She exhibits normal range of motion, no swelling and no effusion. No tenderness found.       Legs: Neurological: She is alert and oriented to  person, place, and time. Gait normal.  Psychiatric: She has a normal mood and affect. Her behavior is normal. Judgment normal.  Vitals reviewed.    Medical decision-making Encounter Diagnosis  Name Primary?  . Primary osteoarthritis of both knees Yes      Meds ordered this encounter  Medications  . diclofenac (CATAFLAM) 50 MG tablet    Sig: Take 1 tablet (50 mg total) by mouth 2 (two) times daily.    Dispense:  60 tablet    Refill:  1     Arther Abbott, MD 11/07/2017 3:35 PM

## 2017-12-19 DIAGNOSIS — E782 Mixed hyperlipidemia: Secondary | ICD-10-CM | POA: Diagnosis not present

## 2017-12-19 DIAGNOSIS — I10 Essential (primary) hypertension: Secondary | ICD-10-CM | POA: Diagnosis not present

## 2017-12-19 DIAGNOSIS — E039 Hypothyroidism, unspecified: Secondary | ICD-10-CM | POA: Diagnosis not present

## 2017-12-19 DIAGNOSIS — Z1389 Encounter for screening for other disorder: Secondary | ICD-10-CM | POA: Diagnosis not present

## 2017-12-19 DIAGNOSIS — Z23 Encounter for immunization: Secondary | ICD-10-CM | POA: Diagnosis not present

## 2018-01-08 ENCOUNTER — Telehealth: Payer: Self-pay | Admitting: Orthopedic Surgery

## 2018-01-08 MED ORDER — DICLOFENAC SODIUM 75 MG PO TBEC: 75.0000 mg | DELAYED_RELEASE_TABLET | Freq: Two times a day (BID) | ORAL | 2 refills | Status: DC

## 2018-01-08 NOTE — Telephone Encounter (Signed)
Call received from pharmacist, Celeste, regarding medication - back-ordered with no date of availability: diclofenac (CATAFLAM) 50 MG tablet 60 tablet  - [place]    - please advise.

## 2018-01-08 NOTE — Telephone Encounter (Signed)
yes

## 2018-01-08 NOTE — Telephone Encounter (Signed)
Ok to change to Diclofenac 75?

## 2018-04-16 ENCOUNTER — Other Ambulatory Visit: Payer: Self-pay | Admitting: Orthopedic Surgery

## 2018-08-09 DIAGNOSIS — E039 Hypothyroidism, unspecified: Secondary | ICD-10-CM | POA: Diagnosis not present

## 2018-08-09 DIAGNOSIS — I10 Essential (primary) hypertension: Secondary | ICD-10-CM | POA: Diagnosis not present

## 2018-08-09 DIAGNOSIS — E782 Mixed hyperlipidemia: Secondary | ICD-10-CM | POA: Diagnosis not present

## 2018-08-09 DIAGNOSIS — Z1389 Encounter for screening for other disorder: Secondary | ICD-10-CM | POA: Diagnosis not present

## 2018-08-15 ENCOUNTER — Other Ambulatory Visit (HOSPITAL_COMMUNITY): Payer: Self-pay | Admitting: Family Medicine

## 2018-08-15 DIAGNOSIS — E2839 Other primary ovarian failure: Secondary | ICD-10-CM

## 2018-08-15 DIAGNOSIS — Z1239 Encounter for other screening for malignant neoplasm of breast: Secondary | ICD-10-CM

## 2018-09-18 ENCOUNTER — Other Ambulatory Visit: Payer: Self-pay | Admitting: Orthopedic Surgery

## 2018-10-28 ENCOUNTER — Encounter: Payer: Self-pay | Admitting: Orthopedic Surgery

## 2018-10-28 ENCOUNTER — Ambulatory Visit: Payer: Medicare Other | Admitting: Orthopedic Surgery

## 2018-10-28 ENCOUNTER — Ambulatory Visit (INDEPENDENT_AMBULATORY_CARE_PROVIDER_SITE_OTHER): Payer: Medicare Other

## 2018-10-28 VITALS — BP 143/76 | HR 78 | Ht 67.0 in | Wt 178.0 lb

## 2018-10-28 DIAGNOSIS — M1712 Unilateral primary osteoarthritis, left knee: Secondary | ICD-10-CM | POA: Diagnosis not present

## 2018-10-28 DIAGNOSIS — M25562 Pain in left knee: Secondary | ICD-10-CM

## 2018-10-28 DIAGNOSIS — M25462 Effusion, left knee: Secondary | ICD-10-CM | POA: Diagnosis not present

## 2018-10-28 MED ORDER — DICLOFENAC SODIUM 75 MG PO TBEC: 75.0000 mg | DELAYED_RELEASE_TABLET | Freq: Two times a day (BID) | ORAL | 2 refills | Status: DC

## 2018-10-28 NOTE — Progress Notes (Signed)
Progress Note   Patient ID: Diana Skinner, female   DOB: [birth year], 67 y.o.   MRN: [id number]   Chief Complaint  Patient presents with  . Knee Pain    left    HPI The patient presents for evaluation of painful left knee.  She is already had a knee arthroscopy she is currently on diclofenac for arthritis pain comes in complaining of 3-week history of pain swelling left knee and difficulty bending her knee and getting out of a chair she thinks there may be some fluid on the knee Quality dull pain  Review of Systems  Constitutional: Negative for chills and fever.  Neurological: Negative for tingling.   Current Meds  Medication Sig  . albuterol (PROVENTIL HFA;VENTOLIN HFA) 108 (90 Base) MCG/ACT inhaler Inhale 2 puffs into the lungs every 6 (six) hours as needed for wheezing or shortness of breath.  . diclofenac (VOLTAREN) 75 MG EC tablet Take 1 tablet (75 mg total) by mouth 2 (two) times daily.  Marland Kitchen levothyroxine (SYNTHROID, LEVOTHROID) 50 MCG tablet   . losartan (COZAAR) 50 MG tablet Take 50 mg by mouth daily.  . Omega-3 Fatty Acids (FISH OIL) 1000 MG CAPS Take 2 capsules by mouth daily.  Marland Kitchen omeprazole (PRILOSEC) 40 MG capsule Take 40 mg by mouth once.  Vladimir Faster Glycol-Propyl Glycol (SYSTANE OP) Apply 1 drop to eye daily as needed (dry eyes).  . rosuvastatin (CRESTOR) 5 MG tablet   . venlafaxine XR (EFFEXOR-XR) 75 MG 24 hr capsule Take 1 capsule by mouth daily.  . [DISCONTINUED] diclofenac (VOLTAREN) 75 MG EC tablet TAKE ONE TABLET (75MG  TOTAL) BY MOUTH TWO DAILY WITH MEAL    Past Medical History:  Diagnosis Date  . Anxiety   . Arthritis   . High cholesterol   . HTN (hypertension)   . Migraines      Allergies  Allergen Reactions  . Sulfonamide Derivatives Swelling     BP (!) 143/76   Pulse 78   Ht 5\' 7"  (1.702 m)   Wt 178 lb (80.7 kg)   BMI 27.88 kg/m    Physical Exam General appearance normal Oriented x3 normal Mood pleasant affect normal Gait mostly normal  may have stride length deficit and cadence deficit  Ortho Exam Right knee  Inspection and palpation revealed no abnormalities Range of motion is full No instability was detected on stress testing Muscle tone and strength was normal without tremor Skin was warm dry and intact Good pulse and temperature were noted in the extremity Sensation revealed no abnormalities to light touch  Left knee Joint effusion medial joint line tenderness 130 degrees of knee flexion small flexion contracture but is passively correctable No instability Normal muscle tone and strength Skin warm dry and intact without erythema Good pulse normal temperature Normal sensation   MEDICAL DECISION MAKING   Imaging:  X-ray today shows joint space narrowing medial compartment varus alignment to the knee subchondral bone secondary bone changes medial compartment   Encounter Diagnoses  Name Primary?  . Left knee pain, unspecified chronicity   . Effusion of knee joint, left Yes  . Primary osteoarthritis of left knee      PLAN: (RX., injection, surgery,frx,mri/ct, XR 2 body ares) Aspiration injection  Refill diclofenac  Continue weight control and exercise  Fu as needed   Procedure note injection and aspiration left knee joint  Verbal consent was obtained to aspirate and inject the left knee joint   Timeout was completed to confirm the site of  aspiration and injection  An 18-gauge needle was used to aspirate the left knee joint from a suprapatellar lateral approach.  The medications used were 40 mg of Depo-Medrol and 1% lidocaine 3 cc  Anesthesia was provided by ethyl chloride and the skin was prepped with alcohol.  After cleaning the skin with alcohol an 18-gauge needle was used to aspirate the right knee joint.  We obtained 12 cc of fluid clear   We followed this by injection of 40 mg of Depo-Medrol and 3 cc 1% lidocaine.  There were no complications. A sterile bandage was  applied.   Meds ordered this encounter  Medications  . diclofenac (VOLTAREN) 75 MG EC tablet    Sig: Take 1 tablet (75 mg total) by mouth 2 (two) times daily.    Dispense:  60 tablet    Refill:  2   3:05 PM 10/28/2018

## 2018-10-28 NOTE — Patient Instructions (Signed)
Continue exercise  Continue to keep your weight under control as you are  Continue diclofenac

## 2018-11-13 ENCOUNTER — Ambulatory Visit: Payer: Medicare Other | Admitting: Orthopedic Surgery

## 2018-11-13 ENCOUNTER — Ambulatory Visit (INDEPENDENT_AMBULATORY_CARE_PROVIDER_SITE_OTHER): Payer: Medicare Other

## 2018-11-13 ENCOUNTER — Encounter: Payer: Self-pay | Admitting: Orthopedic Surgery

## 2018-11-13 VITALS — BP 118/85 | HR 81 | Ht 67.0 in | Wt 176.0 lb

## 2018-11-13 DIAGNOSIS — M7051 Other bursitis of knee, right knee: Secondary | ICD-10-CM | POA: Diagnosis not present

## 2018-11-13 DIAGNOSIS — M25561 Pain in right knee: Secondary | ICD-10-CM

## 2018-11-13 DIAGNOSIS — G8929 Other chronic pain: Secondary | ICD-10-CM | POA: Diagnosis not present

## 2018-11-13 NOTE — Progress Notes (Signed)
Progress Note   Patient ID: Diana Skinner, female   DOB: [birth year], 67 y.o.   MRN: [id number]   Chief Complaint  Patient presents with  . Knee Pain    right knee pain flare medial knee joint/ left knee still some pain after injection     HPI The patient presents for evaluation of pain right knee  I was helping by a friend decorate  Diana Skinner received an injection from [place] in her left knee on November 25 she presents now with new pain in her right knee over the medial aspect just below the joint line after decorating with her friend and going up and down the steps.  She has dull throbbing pain medial side of the right knee with swelling over the Pez bursa x3 days with no history of trauma.  She did try some ice.  She has some pain in the foot but no radicular symptoms in the leg   Review of Systems  Constitutional: Negative for chills and fever.  Musculoskeletal: Positive for joint pain. Negative for back pain.  Neurological: Negative for tingling.   Current Meds  Medication Sig  . albuterol (PROVENTIL HFA;VENTOLIN HFA) 108 (90 Base) MCG/ACT inhaler Inhale 2 puffs into the lungs every 6 (six) hours as needed for wheezing or shortness of breath.  . diclofenac (VOLTAREN) 75 MG EC tablet Take 1 tablet (75 mg total) by mouth 2 (two) times daily.  Marland Kitchen levothyroxine (SYNTHROID, LEVOTHROID) 50 MCG tablet   . losartan (COZAAR) 50 MG tablet Take 50 mg by mouth daily.  . Omega-3 Fatty Acids (FISH OIL) 1000 MG CAPS Take 2 capsules by mouth daily.  Marland Kitchen omeprazole (PRILOSEC) 40 MG capsule Take 40 mg by mouth once.  Vladimir Faster Glycol-Propyl Glycol (SYSTANE OP) Apply 1 drop to eye daily as needed (dry eyes).  . rosuvastatin (CRESTOR) 5 MG tablet   . venlafaxine XR (EFFEXOR-XR) 75 MG 24 hr capsule Take 1 capsule by mouth daily.    Past Medical History:  Diagnosis Date  . Anxiety   . Arthritis   . High cholesterol   . HTN (hypertension)   . Migraines      Allergies  Allergen Reactions  .  Sulfonamide Derivatives Swelling     BP 118/85   Pulse 81   Ht 5\' 7"  (1.702 m)   Wt 176 lb (79.8 kg)   BMI 27.57 kg/m    Physical Exam  Constitutional: She is oriented to person, place, and time. She appears well-developed and well-nourished.  Neurological: She is alert and oriented to person, place, and time. Gait abnormal.  Right lower extremity limping  Psychiatric: She has a normal mood and affect. Judgment normal.  Vitals reviewed.   Ortho Exam  Right knee tenderness over the medial pes bursa with swelling range of motion is normal the knee is stable muscle tone and strength are normal without atrophy or tremor skin shows no erythema rash or ulceration pulse and temperature normal without edema sensation is normal.  Left knee no swelling mild tenderness medial joint line this is chronic neurovascular exam intact  MEDICAL DECISION MAKING   Imaging:  X-ray was obtained of the right knee she has mild symmetric arthritis with normal alignment   Encounter Diagnoses  Name Primary?  . Chronic pain of right knee   . Pes anserinus bursitis of right knee Yes     PLAN: (RX., injection, surgery,frx,mri/ct, XR 2 body ares) Recommend bursal injection  Procedure note right knee injection for bursitis  verbal consent was obtained to inject right knee PES BURSA  Timeout was completed to confirm the site of injection  The medications used were 40 mg of Depo-Medrol and 1% lidocaine 3 cc  Anesthesia was provided by ethyl chloride and the skin was prepped with alcohol.  After cleaning the skin with alcohol a 25-gauge needle was used to inject the right knee bursa.  There were no complications and a sterile bandage was applied   No orders of the defined types were placed in this encounter.  4:46 PM 11/13/2018

## 2018-11-13 NOTE — Patient Instructions (Signed)
ICE

## 2018-11-25 ENCOUNTER — Telehealth: Payer: Self-pay | Admitting: Orthopedic Surgery

## 2018-11-25 NOTE — Telephone Encounter (Signed)
Called back to patient to offer appt, next available, reached voice mail, left message.

## 2018-11-25 NOTE — Telephone Encounter (Signed)
Patient called regarding right knee for which she was last seen 11/13/18; states still hurting, throbbing, pulsating at times.  States has tried ice, has also been trying heat, which she said seems to help. Please call cell [phone] (in case needed - uses [employer]

## 2018-11-25 NOTE — Telephone Encounter (Signed)
Ok to schedule appointment for next available.

## 2018-12-19 DIAGNOSIS — H2513 Age-related nuclear cataract, bilateral: Secondary | ICD-10-CM | POA: Diagnosis not present

## 2019-01-20 DIAGNOSIS — H2511 Age-related nuclear cataract, right eye: Secondary | ICD-10-CM | POA: Diagnosis not present

## 2019-01-21 DIAGNOSIS — E7849 Other hyperlipidemia: Secondary | ICD-10-CM | POA: Diagnosis not present

## 2019-01-21 DIAGNOSIS — E039 Hypothyroidism, unspecified: Secondary | ICD-10-CM | POA: Diagnosis not present

## 2019-01-21 DIAGNOSIS — R7309 Other abnormal glucose: Secondary | ICD-10-CM | POA: Diagnosis not present

## 2019-01-21 DIAGNOSIS — Z1389 Encounter for screening for other disorder: Secondary | ICD-10-CM | POA: Diagnosis not present

## 2019-01-21 DIAGNOSIS — I10 Essential (primary) hypertension: Secondary | ICD-10-CM | POA: Diagnosis not present

## 2019-01-21 DIAGNOSIS — Z0001 Encounter for general adult medical examination with abnormal findings: Secondary | ICD-10-CM | POA: Diagnosis not present

## 2019-01-22 ENCOUNTER — Encounter (HOSPITAL_COMMUNITY)
Admission: RE | Admit: 2019-01-22 | Discharge: 2019-01-22 | Disposition: A | Discharge status: Home / Self Care | Payer: Medicare Other | Source: Ambulatory Visit | Attending: Ophthalmology | Admitting: Ophthalmology

## 2019-01-22 ENCOUNTER — Ambulatory Visit: Payer: Medicare Other | Admitting: Orthopedic Surgery

## 2019-01-27 ENCOUNTER — Ambulatory Visit (HOSPITAL_COMMUNITY)
Admission: RE | Admit: 2019-01-27 | Discharge: 2019-01-27 | Disposition: A | Discharge status: Home / Self Care | Payer: Medicare Other | Attending: Ophthalmology | Admitting: Ophthalmology

## 2019-01-27 ENCOUNTER — Ambulatory Visit (HOSPITAL_COMMUNITY): Payer: Medicare Other | Admitting: Anesthesiology

## 2019-01-27 ENCOUNTER — Encounter (HOSPITAL_COMMUNITY)
Admission: RE | Disposition: A | Discharge status: Home / Self Care | Payer: Self-pay | Source: Home / Self Care | Attending: Ophthalmology

## 2019-01-27 ENCOUNTER — Encounter (HOSPITAL_COMMUNITY): Payer: Self-pay

## 2019-01-27 DIAGNOSIS — E079 Disorder of thyroid, unspecified: Secondary | ICD-10-CM | POA: Insufficient documentation

## 2019-01-27 DIAGNOSIS — K219 Gastro-esophageal reflux disease without esophagitis: Secondary | ICD-10-CM | POA: Diagnosis not present

## 2019-01-27 DIAGNOSIS — F419 Anxiety disorder, unspecified: Secondary | ICD-10-CM | POA: Insufficient documentation

## 2019-01-27 DIAGNOSIS — E7800 Pure hypercholesterolemia, unspecified: Secondary | ICD-10-CM | POA: Diagnosis not present

## 2019-01-27 DIAGNOSIS — H259 Unspecified age-related cataract: Secondary | ICD-10-CM | POA: Diagnosis not present

## 2019-01-27 DIAGNOSIS — Z882 Allergy status to sulfonamides status: Secondary | ICD-10-CM | POA: Diagnosis not present

## 2019-01-27 DIAGNOSIS — R51 Headache: Secondary | ICD-10-CM | POA: Diagnosis not present

## 2019-01-27 DIAGNOSIS — I10 Essential (primary) hypertension: Secondary | ICD-10-CM | POA: Insufficient documentation

## 2019-01-27 DIAGNOSIS — Z79899 Other long term (current) drug therapy: Secondary | ICD-10-CM | POA: Diagnosis not present

## 2019-01-27 DIAGNOSIS — H2511 Age-related nuclear cataract, right eye: Secondary | ICD-10-CM | POA: Diagnosis not present

## 2019-01-27 HISTORY — PX: CATARACT EXTRACTION W/PHACO: SHX586

## 2019-01-27 SURGERY — PHACOEMULSIFICATION, CATARACT, WITH IOL INSERTION
Anesthesia: Monitor Anesthesia Care | Site: Eye | Laterality: Right

## 2019-01-27 MED ORDER — TETRACAINE HCL 0.5 % OP SOLN
1.0000 [drp] | OPHTHALMIC | Status: AC
  Administered 2019-01-27 (×3): 1 [drp] via OPHTHALMIC

## 2019-01-27 MED ORDER — MIDAZOLAM HCL 2 MG/2ML IJ SOLN
INTRAMUSCULAR | Status: AC
  Filled 2019-01-27: qty 2

## 2019-01-27 MED ORDER — PROVISC 10 MG/ML IO SOLN
INTRAOCULAR | Status: DC | PRN
  Administered 2019-01-27: 0.85 mL via INTRAOCULAR

## 2019-01-27 MED ORDER — CYCLOPENTOLATE-PHENYLEPHRINE 0.2-1 % OP SOLN
1.0000 [drp] | OPHTHALMIC | Status: AC
  Administered 2019-01-27 (×3): 1 [drp] via OPHTHALMIC

## 2019-01-27 MED ORDER — LIDOCAINE HCL (PF) 1 % IJ SOLN
INTRAOCULAR | Status: DC | PRN
  Administered 2019-01-27: 1 mL via OPHTHALMIC

## 2019-01-27 MED ORDER — POVIDONE-IODINE 5 % OP SOLN
OPHTHALMIC | Status: DC | PRN
  Administered 2019-01-27: 1 via OPHTHALMIC

## 2019-01-27 MED ORDER — LIDOCAINE HCL 3.5 % OP GEL
1.0000 "application " | Freq: Once | OPHTHALMIC | Status: AC
  Administered 2019-01-27: 1 via OPHTHALMIC

## 2019-01-27 MED ORDER — BSS IO SOLN
INTRAOCULAR | Status: DC | PRN
  Administered 2019-01-27: 15 mL

## 2019-01-27 MED ORDER — PHENYLEPHRINE HCL 2.5 % OP SOLN
1.0000 [drp] | OPHTHALMIC | Status: AC
  Administered 2019-01-27 (×3): 1 [drp] via OPHTHALMIC

## 2019-01-27 MED ORDER — EPINEPHRINE PF 1 MG/ML IJ SOLN
INTRAOCULAR | Status: DC | PRN
  Administered 2019-01-27: 500 mL

## 2019-01-27 MED ORDER — MIDAZOLAM HCL 5 MG/5ML IJ SOLN
INTRAMUSCULAR | Status: DC | PRN
  Administered 2019-01-27 (×2): 1 mg via INTRAVENOUS

## 2019-01-27 MED ORDER — SODIUM HYALURONATE 23 MG/ML IO SOLN
INTRAOCULAR | Status: DC | PRN
  Administered 2019-01-27: 0.6 mL via INTRAOCULAR

## 2019-01-27 MED ORDER — EPINEPHRINE PF 1 MG/ML IJ SOLN
INTRAMUSCULAR | Status: AC
  Filled 2019-01-27: qty 2

## 2019-01-27 SURGICAL SUPPLY — 15 items
CLOTH BEACON ORANGE TIMEOUT ST (SAFETY) ×1 IMPLANT
EYE SHIELD UNIVERSAL CLEAR (GAUZE/BANDAGES/DRESSINGS) ×1 IMPLANT
GLOVE BIOGEL PI IND STRL 6.5 (GLOVE) IMPLANT
GLOVE BIOGEL PI IND STRL 7.0 (GLOVE) IMPLANT
GLOVE BIOGEL PI INDICATOR 6.5 (GLOVE) ×1
GLOVE BIOGEL PI INDICATOR 7.0 (GLOVE) ×1
LENS ALC ACRYL/TECN (Ophthalmic Related) ×1 IMPLANT
NDL HYPO 18GX1.5 BLUNT FILL (NEEDLE) IMPLANT
NEEDLE HYPO 18GX1.5 BLUNT FILL (NEEDLE) ×2 IMPLANT
PAD ARMBOARD 7.5X6 YLW CONV (MISCELLANEOUS) ×1 IMPLANT
SYR TB 1ML LL NO SAFETY (SYRINGE) ×1 IMPLANT
TAPE SURG TRANSPORE 1 IN (GAUZE/BANDAGES/DRESSINGS) IMPLANT
TAPE SURGICAL TRANSPORE 1 IN (GAUZE/BANDAGES/DRESSINGS) ×1
VISCOELASTIC ADDITIONAL (OPHTHALMIC RELATED) ×1 IMPLANT
WATER STERILE IRR 250ML POUR (IV SOLUTION) ×1 IMPLANT

## 2019-01-27 NOTE — Anesthesia Postprocedure Evaluation (Signed)
Anesthesia Post Note  Patient: Diana Skinner  Procedure(s) Performed: CATARACT EXTRACTION PHACO AND INTRAOCULAR LENS PLACEMENT ([place]) (Right Eye)  Patient location during evaluation: Short Stay Anesthesia Type: MAC Level of consciousness: awake and alert and oriented Pain management: pain level controlled Vital Signs Assessment: post-procedure vital signs reviewed and stable Respiratory status: spontaneous breathing Cardiovascular status: blood pressure returned to baseline and stable Postop Assessment: no apparent nausea or vomiting Anesthetic complications: no     Last Vitals:  Vitals:   01/27/19 0744 01/27/19 0749  BP:  (!) 147/108  Pulse: 73   Resp: 16   Temp: 36.6 C   SpO2: 96%     Last Pain:  Vitals:   01/27/19 0744  TempSrc: Oral  PainSc: 0-No pain                 Alechia Lezama

## 2019-01-27 NOTE — Addendum Note (Signed)
Addendum  created 01/27/19 0915 by Ollen Bowl, CRNA   Intraprocedure Flowsheets edited

## 2019-01-27 NOTE — Discharge Instructions (Addendum)
PATIENT INSTRUCTIONS °POST-ANESTHESIA ° °IMMEDIATELY FOLLOWING SURGERY:  Do not drive or operate machinery for the first twenty four hours after surgery.  Do not make any important decisions for twenty four hours after surgery or while taking narcotic pain medications or sedatives.  If you develop intractable nausea and vomiting or a severe headache please notify your doctor immediately. ° °FOLLOW-UP:  Please make an appointment with your surgeon as instructed. You do not need to follow up with anesthesia unless specifically instructed to do so. ° °WOUND CARE INSTRUCTIONS (if applicable):  Keep a dry clean dressing on the anesthesia/puncture wound site if there is drainage.  Once the wound has quit draining you may leave it open to air.  Generally you should leave the bandage intact for twenty four hours unless there is drainage.  If the epidural site drains for more than 36-48 hours please call the anesthesia department. ° °QUESTIONS?:  Please feel free to call your physician or the hospital operator if you have any questions, and they will be happy to assist you.    ° ° °Please discharge patient when stable, will follow up today with Dr. Wrzosek at the [facility] office immediately following discharge.  Leave shield in place until visit.  All paperwork with discharge instructions will be given at the office. ° °

## 2019-01-27 NOTE — Transfer of Care (Signed)
Immediate Anesthesia Transfer of Care Note  Patient: Diana Skinner  Procedure(s) Performed: CATARACT EXTRACTION PHACO AND INTRAOCULAR LENS PLACEMENT ([facility]) (Right Eye)  Patient Location: Short Stay  Anesthesia Type:MAC  Level of Consciousness: awake  Airway & Oxygen Therapy: Patient Spontanous Breathing  Post-op Assessment: Report given to RN  Post vital signs: Reviewed  Last Vitals:  Vitals Value Taken Time  BP    Temp    Pulse    Resp    SpO2      Last Pain:  Vitals:   01/27/19 0744  TempSrc: Oral  PainSc: 0-No pain      Patients Stated Pain Goal: 7 ([id number])  Complications: No apparent anesthesia complications

## 2019-01-27 NOTE — Op Note (Addendum)
Date of procedure: 01/27/19  Pre-operative diagnosis: Visually significant age-related cataract, Right Eye (H25.11)  Post-operative diagnosis: Visually significant age-related cataract, Right Eye  Procedure: Removal of cataract via phacoemulsification and insertion of intra-ocular lens Wynetta Emery and [employer] PCB00  +23.5D into the capsular bag of the Right Eye  Attending surgeon: Gerda Diss. Shelsea Hangartner, MD, MA  Anesthesia: MAC, Topical Akten  Complications: None  Estimated Blood Loss: <59m (minimal)  Specimens: None  Implants: As above  Indications:  Visually significant age-related cataract, Right Eye  Procedure:  The patient was seen and identified in the pre-operative area. The operative eye was identified and dilated.  The operative eye was marked.  Topical anesthesia was administered to the operative eye.     The patient was then to the operative suite and placed in the supine position.  A timeout was performed confirming the patient, procedure to be performed, and all other relevant information.   The patient's face was prepped and draped in the usual fashion for intra-ocular surgery.  A lid speculum was placed into the operative eye and the surgical microscope moved into place and focused.  A superotemporal paracentesis was created using a 20 gauge paracentesis blade.  Shugarcaine was injected into the anterior chamber.  Viscoelastic was injected into the anterior chamber.  A temporal clear-corneal main wound incision was created using a 2.466mmicrokeratome.  A continuous curvilinear capsulorrhexis was initiated using an irrigating cystitome and completed using capsulorrhexis forceps.  Hydrodissection and hydrodeliniation were performed.  Viscoelastic was injected into the anterior chamber.  A phacoemulsification handpiece and a chopper as a second instrument were used to remove the nucleus and epinucleus. The irrigation/aspiration handpiece was used to remove any remaining cortical  material.   The capsular bag was reinflated with viscoelastic, checked, and found to be intact.  The intraocular lens was inserted into the capsular bag and dialed into place using a Kuglen hook.  The irrigation/aspiration handpiece was used to remove any remaining viscoelastic.  The clear corneal wound and paracentesis wounds were then hydrated and checked with Weck-Cels to be watertight.  The lid-speculum and drape was removed, and the patient's face was cleaned with a wet and dry 4x4.  The patient was taken to the post-operative care unit in good condition, having tolerated the procedure well.  Post-Op Instructions: The patient will follow up at [facility] a same day post-operative evaluation and will receive all other orders and instructions.

## 2019-01-27 NOTE — H&P (Signed)
The H and P was reviewed and updated. The patient was examined.  No changes were found after exam.  The surgical eye was marked.  

## 2019-01-27 NOTE — Anesthesia Preprocedure Evaluation (Signed)
Anesthesia Evaluation  Patient identified by MRN, date of birth, ID band Patient awake    Reviewed: Allergy & Precautions, H&P , NPO status , Patient's Chart, lab work & pertinent test results  Airway Mallampati: II  TM Distance: >3 FB Neck ROM: full    Dental no notable dental hx.    Pulmonary neg pulmonary ROS,    Pulmonary exam normal breath sounds clear to auscultation       Cardiovascular Exercise Tolerance: Good hypertension, negative cardio ROS   Rhythm:regular Rate:Normal     Neuro/Psych  Headaches, Anxiety negative psych ROS   GI/Hepatic negative GI ROS, Neg liver ROS,   Endo/Other  negative endocrine ROS  Renal/GU negative Renal ROS  negative genitourinary   Musculoskeletal   Abdominal   Peds  Hematology negative hematology ROS (+)   Anesthesia Other Findings   Reproductive/Obstetrics negative OB ROS                             Anesthesia Physical Anesthesia Plan  ASA: II  Anesthesia Plan: MAC   Post-op Pain Management:    Induction:   PONV Risk Score and Plan:   Airway Management Planned:   Additional Equipment:   Intra-op Plan:   Post-operative Plan:   Informed Consent: I have reviewed the patients History and Physical, chart, labs and discussed the procedure including the risks, benefits and alternatives for the proposed anesthesia with the patient or authorized representative who has indicated his/her understanding and acceptance.     Dental Advisory Given  Plan Discussed with: CRNA  Anesthesia Plan Comments:         Anesthesia Quick Evaluation

## 2019-01-28 ENCOUNTER — Encounter (HOSPITAL_COMMUNITY): Payer: Self-pay | Admitting: Ophthalmology

## 2019-01-31 ENCOUNTER — Encounter (HOSPITAL_COMMUNITY): Payer: Self-pay

## 2019-02-03 ENCOUNTER — Encounter (HOSPITAL_COMMUNITY)
Admission: RE | Admit: 2019-02-03 | Discharge: 2019-02-03 | Disposition: A | Discharge status: Home / Self Care | Payer: Medicare Other | Source: Ambulatory Visit | Attending: Ophthalmology | Admitting: Ophthalmology

## 2019-02-03 DIAGNOSIS — H25812 Combined forms of age-related cataract, left eye: Secondary | ICD-10-CM | POA: Diagnosis not present

## 2019-02-10 ENCOUNTER — Ambulatory Visit (HOSPITAL_COMMUNITY)
Admission: RE | Admit: 2019-02-10 | Discharge: 2019-02-10 | Disposition: A | Discharge status: Home / Self Care | Payer: Medicare Other | Attending: Ophthalmology | Admitting: Ophthalmology

## 2019-02-10 ENCOUNTER — Ambulatory Visit (HOSPITAL_COMMUNITY): Payer: Medicare Other | Admitting: Anesthesiology

## 2019-02-10 ENCOUNTER — Encounter (HOSPITAL_COMMUNITY): Payer: Self-pay | Admitting: Anesthesiology

## 2019-02-10 ENCOUNTER — Encounter (HOSPITAL_COMMUNITY)
Admission: RE | Disposition: A | Discharge status: Home / Self Care | Payer: Self-pay | Source: Home / Self Care | Attending: Ophthalmology

## 2019-02-10 DIAGNOSIS — R51 Headache: Secondary | ICD-10-CM | POA: Diagnosis not present

## 2019-02-10 DIAGNOSIS — M1990 Unspecified osteoarthritis, unspecified site: Secondary | ICD-10-CM | POA: Diagnosis not present

## 2019-02-10 DIAGNOSIS — I10 Essential (primary) hypertension: Secondary | ICD-10-CM | POA: Insufficient documentation

## 2019-02-10 DIAGNOSIS — H259 Unspecified age-related cataract: Secondary | ICD-10-CM | POA: Diagnosis not present

## 2019-02-10 DIAGNOSIS — Z882 Allergy status to sulfonamides status: Secondary | ICD-10-CM | POA: Diagnosis not present

## 2019-02-10 DIAGNOSIS — E079 Disorder of thyroid, unspecified: Secondary | ICD-10-CM | POA: Insufficient documentation

## 2019-02-10 DIAGNOSIS — Z79899 Other long term (current) drug therapy: Secondary | ICD-10-CM | POA: Diagnosis not present

## 2019-02-10 DIAGNOSIS — K219 Gastro-esophageal reflux disease without esophagitis: Secondary | ICD-10-CM | POA: Insufficient documentation

## 2019-02-10 DIAGNOSIS — F419 Anxiety disorder, unspecified: Secondary | ICD-10-CM | POA: Diagnosis not present

## 2019-02-10 DIAGNOSIS — H25812 Combined forms of age-related cataract, left eye: Secondary | ICD-10-CM | POA: Diagnosis not present

## 2019-02-10 HISTORY — PX: CATARACT EXTRACTION W/PHACO: SHX586

## 2019-02-10 SURGERY — PHACOEMULSIFICATION, CATARACT, WITH IOL INSERTION
Anesthesia: Monitor Anesthesia Care | Site: Eye | Laterality: Left

## 2019-02-10 MED ORDER — MIDAZOLAM HCL 5 MG/5ML IJ SOLN
INTRAMUSCULAR | Status: DC | PRN
  Administered 2019-02-10 (×2): 1 mg via INTRAVENOUS

## 2019-02-10 MED ORDER — TETRACAINE HCL 0.5 % OP SOLN
1.0000 [drp] | OPHTHALMIC | Status: AC
  Administered 2019-02-10 (×3): 1 [drp] via OPHTHALMIC

## 2019-02-10 MED ORDER — LACTATED RINGERS IV SOLN: INTRAVENOUS | Status: DC

## 2019-02-10 MED ORDER — SODIUM HYALURONATE 23 MG/ML IO SOLN
INTRAOCULAR | Status: DC | PRN
  Administered 2019-02-10: 0.6 mL via INTRAOCULAR

## 2019-02-10 MED ORDER — CYCLOPENTOLATE-PHENYLEPHRINE 0.2-1 % OP SOLN
1.0000 [drp] | OPHTHALMIC | Status: AC
  Administered 2019-02-10 (×3): 1 [drp] via OPHTHALMIC

## 2019-02-10 MED ORDER — POVIDONE-IODINE 5 % OP SOLN
OPHTHALMIC | Status: DC | PRN
  Administered 2019-02-10: 1 via OPHTHALMIC

## 2019-02-10 MED ORDER — LIDOCAINE HCL 3.5 % OP GEL
1.0000 "application " | Freq: Once | OPHTHALMIC | Status: AC
  Administered 2019-02-10: 1 via OPHTHALMIC

## 2019-02-10 MED ORDER — MIDAZOLAM HCL 2 MG/2ML IJ SOLN
INTRAMUSCULAR | Status: AC
  Filled 2019-02-10: qty 2

## 2019-02-10 MED ORDER — BSS IO SOLN
INTRAOCULAR | Status: DC | PRN
  Administered 2019-02-10: 15 mL

## 2019-02-10 MED ORDER — EPINEPHRINE PF 1 MG/ML IJ SOLN
INTRAOCULAR | Status: DC | PRN
  Administered 2019-02-10: 500 mL

## 2019-02-10 MED ORDER — LIDOCAINE HCL (PF) 1 % IJ SOLN
INTRAOCULAR | Status: DC | PRN
  Administered 2019-02-10: 1 mL via OPHTHALMIC

## 2019-02-10 MED ORDER — PHENYLEPHRINE HCL 2.5 % OP SOLN
1.0000 [drp] | OPHTHALMIC | Status: AC
  Administered 2019-02-10 (×3): 1 [drp] via OPHTHALMIC

## 2019-02-10 MED ORDER — PROVISC 10 MG/ML IO SOLN
INTRAOCULAR | Status: DC | PRN
  Administered 2019-02-10: 0.85 mL via INTRAOCULAR

## 2019-02-10 SURGICAL SUPPLY — 14 items
CLOTH BEACON ORANGE TIMEOUT ST (SAFETY) ×2 IMPLANT
EYE SHIELD UNIVERSAL CLEAR (GAUZE/BANDAGES/DRESSINGS) ×2 IMPLANT
GLOVE BIOGEL PI IND STRL 6.5 (GLOVE) IMPLANT
GLOVE BIOGEL PI IND STRL 7.0 (GLOVE) IMPLANT
GLOVE BIOGEL PI INDICATOR 6.5 (GLOVE) ×2
GLOVE BIOGEL PI INDICATOR 7.0 (GLOVE) ×2
LENS ALC ACRYL/TECN (Ophthalmic Related) ×2 IMPLANT
NDL HYPO 18GX1.5 BLUNT FILL (NEEDLE) IMPLANT
NEEDLE HYPO 18GX1.5 BLUNT FILL (NEEDLE) ×3 IMPLANT
PAD ARMBOARD 7.5X6 YLW CONV (MISCELLANEOUS) ×2 IMPLANT
TAPE SURG TRANSPORE 1 IN (GAUZE/BANDAGES/DRESSINGS) IMPLANT
TAPE SURGICAL TRANSPORE 1 IN (GAUZE/BANDAGES/DRESSINGS) ×2
VISCOELASTIC ADDITIONAL (OPHTHALMIC RELATED) ×2 IMPLANT
WATER STERILE IRR 250ML POUR (IV SOLUTION) ×2 IMPLANT

## 2019-02-10 NOTE — Discharge Instructions (Signed)
Please discharge patient when stable, will follow up today with Dr. Lanard Arguijo at the [facility] office immediately following discharge.  Leave shield in place until visit.  All paperwork with discharge instructions will be given at the office. ° °

## 2019-02-10 NOTE — Anesthesia Preprocedure Evaluation (Signed)
Anesthesia Evaluation  Patient identified by MRN, date of birth, ID band Patient awake    Reviewed: Allergy & Precautions, NPO status , Patient's Chart, lab work & pertinent test results  Airway Mallampati: II  TM Distance: >3 FB Neck ROM: Full    Dental no notable dental hx. (+) Teeth Intact   Pulmonary neg pulmonary ROS,  States no inhalers in years    Pulmonary exam normal breath sounds clear to auscultation       Cardiovascular Exercise Tolerance: Good hypertension, Pt. on medications negative cardio ROS Normal cardiovascular examI Rhythm:Regular Rate:Normal     Neuro/Psych  Headaches, Anxiety negative psych ROS   GI/Hepatic Neg liver ROS, GERD  Medicated and Controlled,  Endo/Other  negative endocrine ROS  Renal/GU negative Renal ROS  negative genitourinary   Musculoskeletal  (+) Arthritis , Osteoarthritis,    Abdominal   Peds negative pediatric ROS (+)  Hematology negative hematology ROS (+)   Anesthesia Other Findings   Reproductive/Obstetrics negative OB ROS                             Anesthesia Physical Anesthesia Plan  ASA: II  Anesthesia Plan: MAC   Post-op Pain Management:    Induction: Intravenous  PONV Risk Score and Plan:   Airway Management Planned: Nasal Cannula  Additional Equipment:   Intra-op Plan:   Post-operative Plan:   Informed Consent: I have reviewed the patients History and Physical, chart, labs and discussed the procedure including the risks, benefits and alternatives for the proposed anesthesia with the patient or authorized representative who has indicated his/her understanding and acceptance.     Dental advisory given  Plan Discussed with: CRNA  Anesthesia Plan Comments:         Anesthesia Quick Evaluation

## 2019-02-10 NOTE — Transfer of Care (Signed)
Immediate Anesthesia Transfer of Care Note  Patient: Diana Skinner  Procedure(s) Performed: CATARACT EXTRACTION PHACO AND INTRAOCULAR LENS PLACEMENT ([facility]) (Left Eye)  Patient Location: PACU  Anesthesia Type:MAC  Level of Consciousness: awake, alert  and oriented  Airway & Oxygen Therapy: Patient Spontanous Breathing  Post-op Assessment: Report given to RN and Post -op Vital signs reviewed and stable  Post vital signs: Reviewed and stable  Last Vitals:  Vitals Value Taken Time  BP    Temp    Pulse    Resp    SpO2      Last Pain:  Vitals:   02/10/19 1137  PainSc: 0-No pain         Complications: No apparent anesthesia complications

## 2019-02-10 NOTE — H&P (Signed)
The H and P was reviewed and updated. The patient was examined.  No changes were found after exam.  The surgical eye was marked.  

## 2019-02-10 NOTE — Op Note (Signed)
Date of procedure: 02/10/19  Pre-operative diagnosis: Visually significant age-related cataract, Left Eye (H25.812)  Post-operative diagnosis: Visually significant age-related cataract, Left Eye  Procedure: Removal of cataract via phacoemulsification and insertion of intra-ocular lens [employer] [place]  +19.0D into the capsular bag of the Left Eye  Attending surgeon: Gerda Diss. Albertus Chiarelli, MD, MA  Anesthesia: MAC, Topical Akten  Complications: None  Estimated Blood Loss: <17m (minimal)  Specimens: None  Implants: As above  Indications:  Visually significant age-related cataract, Left Eye  Procedure:  The patient was seen and identified in the pre-operative area. The operative eye was identified and dilated.  The operative eye was marked.  Topical anesthesia was administered to the operative eye.     The patient was then to the operative suite and placed in the supine position.  A timeout was performed confirming the patient, procedure to be performed, and all other relevant information.   The patient's face was prepped and draped in the usual fashion for intra-ocular surgery.  A lid speculum was placed into the operative eye and the surgical microscope moved into place and focused.  An inferotemporal paracentesis was created using a 20 gauge paracentesis blade.  Shugarcaine was injected into the anterior chamber.  Viscoelastic was injected into the anterior chamber.  A temporal clear-corneal main wound incision was created using a 2.442mmicrokeratome.  A continuous curvilinear capsulorrhexis was initiated using an irrigating cystitome and completed using capsulorrhexis forceps.  Hydrodissection and hydrodeliniation were performed.  Viscoelastic was injected into the anterior chamber.  A phacoemulsification handpiece and a chopper as a second instrument were used to remove the nucleus and epinucleus. The irrigation/aspiration handpiece was used to remove any remaining cortical  material.   The capsular bag was reinflated with viscoelastic, checked, and found to be intact.  The intraocular lens was inserted into the capsular bag and dialed into place using a Kuglen hook.  The irrigation/aspiration handpiece was used to remove any remaining viscoelastic.  The clear corneal wound and paracentesis wounds were then hydrated and checked with Weck-Cels to be watertight.  The lid-speculum and drape was removed, and the patient's face was cleaned with a wet and dry 4x4.  Maxitrol was instilled in the eye before a clear shield was taped over the eye. The patient was taken to the post-operative care unit in good condition, having tolerated the procedure well.  Post-Op Instructions: [facility] and will receive all other orders and instructions.

## 2019-02-11 ENCOUNTER — Encounter (HOSPITAL_COMMUNITY): Payer: Self-pay | Admitting: Ophthalmology

## 2019-02-11 NOTE — Anesthesia Postprocedure Evaluation (Signed)
Anesthesia Post Note  Patient: Diana Skinner  Procedure(s) Performed: CATARACT EXTRACTION PHACO AND INTRAOCULAR LENS PLACEMENT ([place]) (Left Eye)  Patient location during evaluation: Short Stay Anesthesia Type: MAC Level of consciousness: awake and alert and oriented Pain management: pain level controlled Vital Signs Assessment: post-procedure vital signs reviewed and stable Respiratory status: spontaneous breathing Cardiovascular status: stable and blood pressure returned to baseline Postop Assessment: no apparent nausea or vomiting Anesthetic complications: no Comments: Late entry     Last Vitals:  Vitals:   02/10/19 1137 02/10/19 1312  BP: (!) 135/92 138/89  Pulse: 73 75  Resp: 16 18  Temp: 36.6 C 36.6 C  SpO2: 93% 93%    Last Pain:  Vitals:   02/10/19 1312  TempSrc: Oral  PainSc: 0-No pain                 Bonni Neuser

## 2019-03-12 ENCOUNTER — Other Ambulatory Visit: Payer: Self-pay | Admitting: Orthopedic Surgery

## 2019-04-01 ENCOUNTER — Telehealth: Payer: Self-pay | Admitting: Radiology

## 2019-04-01 NOTE — Telephone Encounter (Signed)
Patient called, left message saying that she needs an actual appt in the office to see Dr Aline Brochure for her knee pain, to see what else can be done, she is hurting.  Please call her to advise.

## 2019-04-02 NOTE — Telephone Encounter (Signed)
Friday new xrays

## 2019-04-02 NOTE — Telephone Encounter (Signed)
Called and made appointment for Friday

## 2019-04-04 ENCOUNTER — Encounter: Payer: Self-pay | Admitting: Orthopedic Surgery

## 2019-04-04 ENCOUNTER — Ambulatory Visit: Payer: Medicare Other | Admitting: Orthopedic Surgery

## 2019-04-04 ENCOUNTER — Other Ambulatory Visit: Payer: Self-pay

## 2019-04-04 ENCOUNTER — Ambulatory Visit (INDEPENDENT_AMBULATORY_CARE_PROVIDER_SITE_OTHER): Payer: Medicare Other

## 2019-04-04 VITALS — BP 139/100 | HR 75 | Temp 97.8°F | Ht 68.0 in | Wt 180.0 lb

## 2019-04-04 DIAGNOSIS — M25562 Pain in left knee: Secondary | ICD-10-CM

## 2019-04-04 DIAGNOSIS — M25561 Pain in right knee: Secondary | ICD-10-CM

## 2019-04-04 DIAGNOSIS — G8929 Other chronic pain: Secondary | ICD-10-CM | POA: Diagnosis not present

## 2019-04-04 NOTE — Patient Instructions (Addendum)
Knee Injection A knee injection is a procedure to get medicine into your knee joint to relieve the pain, swelling, and stiffness of arthritis. Your health care provider uses a needle to inject medicine, which may also help to lubricate and cushion your knee joint. You may need more than one injection. Tell a health care provider about:  Any allergies you have.  All medicines you are taking, including vitamins, herbs, eye drops, creams, and over-the-counter medicines.  Any problems you or family members have had with anesthetic medicines.  Any blood disorders you have.  Any surgeries you have had.  Any medical conditions you have.  Whether you are pregnant or may be pregnant. What are the risks? Generally, this is a safe procedure. However, problems may occur, including:  Infection.  Bleeding.  Symptoms that get worse.  Damage to the area around your knee.  Allergic reaction to any of the medicines.  Skin reactions from repeated injections. What happens before the procedure?  Ask your health care provider about changing or stopping your regular medicines. This is especially important if you are taking diabetes medicines or blood thinners.  Plan to have someone take you home from the hospital or clinic. What happens during the procedure?   You will sit or lie down in a position for your knee to be treated.  The skin over your kneecap will be cleaned with a germ-killing soap.  You will be given a medicine that numbs the area (local anesthetic). You may feel some stinging.  The medicine will be injected into your knee. The needle is carefully placed between your kneecap and your knee. The medicine is injected into the joint space.  The needle will be removed at the end of the procedure.  A bandage (dressing) may be placed over the injection site. The procedure may vary among health care providers and hospitals. What can I expect after the procedure?  Your blood  pressure, heart rate, breathing rate, and blood oxygen level will be monitored until you leave the hospital or clinic.  You may have to move your knee through its full range of motion. This helps to get all the medicine into your joint space.  You will be watched to make sure that you do not have a reaction to the injected medicine.  You may feel more pain, swelling, and warmth than you did before the injection. This reaction may last about 1-2 days. Follow these instructions at home: Medicines  Take over-the-counter and prescription medicines only as told by your doctor.  Do not drive or use heavy machinery while taking prescription pain medicine.  Do not take medicines such as aspirin and ibuprofen unless your health care provider tells you to take them. Injection site care  Follow instructions from your health care provider about: ? How to take care of your puncture site. ? When and how you should change your dressing. ? When you should remove your dressing.  Check your injection area every day for signs of infection. Check for: ? More redness, swelling, or pain after 2 days. ? Fluid or blood. ? Pus or a bad smell. ? Warmth. Managing pain, stiffness, and swelling   If directed, put ice on the injection area: ? Put ice in a plastic bag. ? Place a towel between your skin and the bag. ? Leave the ice on for 20 minutes, 2-3 times per day.  Do not apply heat to your knee.  Raise (elevate) the injection area above the level   of your heart while you are sitting or lying down. General instructions  If you were given a dressing, keep it dry until your health care provider says it can be removed. Ask your health care provider when you can start showering or taking a bath.  Avoid strenuous activities for as long as directed by your health care provider. Ask your health care provider when you can return to your normal activities.  Keep all follow-up visits as told by your health  care provider. This is important. You may need more injections. Contact a health care provider if you have:  A fever.  Warmth in your injection area.  Fluid, blood, or pus coming from your injection site.  Symptoms at your injection site that last longer than 2 days after your procedure. Get help right away if:  Your knee: ? Turns very red. ? Becomes very swollen. ? Is in severe pain. Summary  A knee injection is a procedure to get medicine into your knee joint to relieve the pain, swelling, and stiffness of arthritis.  A needle is carefully placed between your kneecap and your knee to inject medicine into the joint space.  Before the procedure, ask your health care provider about changing or stopping your regular medicines, especially if you are taking diabetes medicines or blood thinners.  Contact your health care provider if you have any problems or questions after your procedure. This information is not intended to replace advice given to you by your health care provider. Make sure you discuss any questions you have with your health care provider. Document Released: 02/11/2007 Document Revised: 12/10/2017 Document Reviewed: 12/10/2017 Elsevier Interactive Patient Education  2019 [employer]  Arthritis Arthritis means joint pain. It can also mean joint disease. A joint is a place where bones come together. People who have arthritis may have:  Red joints.  Swollen joints.  Stiff joints.  Warm joints.  A fever.  A feeling of being sick. Follow these instructions at home: Pay attention to any changes in your symptoms. Take these actions to help with your pain and swelling. Medicines  Take over-the-counter and prescription medicines only as told by your doctor.  Do not take aspirin for pain if your doctor says that you may have gout. Activity  Rest your joint if your doctor tells you to.  Avoid activities that make the pain worse.  Exercise your joint  regularly as told by your doctor. Try doing exercises like: ? Swimming. ? Water aerobics. ? Biking. ? Walking. Joint Care   If your joint is swollen, keep it raised (elevated) if told by your doctor.  If your joint feels stiff in the morning, try taking a warm shower.  If you have diabetes, do not apply heat without asking your doctor.  If told, apply heat to the joint: ? Put a towel between the joint and the hot pack or heating pad. ? Leave the heat on the area for 20-30 minutes.  If told, apply ice to the joint: ? Put ice in a plastic bag. ? Place a towel between your skin and the bag. ? Leave the ice on for 20 minutes, 2-3 times per day.  Keep all follow-up visits as told by your doctor. Contact a doctor if:  The pain gets worse.  You have a fever. Get help right away if:  You have very bad pain in your joint.  You have swelling in your joint.  Your joint is red.  Many joints become painful  and swollen.  You have very bad back pain.  Your leg is very weak.  You cannot control your pee (urine) or poop (stool). This information is not intended to replace advice given to you by your health care provider. Make sure you discuss any questions you have with your health care provider. Document Released: 02/14/2010 Document Revised: 04/27/2016 Document Reviewed: 02/15/2015 Elsevier Interactive Patient Education  2019 [employer].

## 2019-04-04 NOTE — Progress Notes (Signed)
OFFICE VISIT  Chief Complaint  Patient presents with  . Knee Pain    Bilateral knee pain right greater than left, pain going up and down the stairs    68 year old female presents for evaluation of bilateral knee pain right greater than left history of osteoarthritis currently on 75 mg diclofenac twice a day not getting good pain relief pain interferes with her activities of daily living  Bilateral knee pain medial joint posterior medial calf bilaterally pain going up and down the stairs pain is a dull ache difficulty walking get up at night severity moderate duration several years timing constant but intermittent increases and decreases   Review of Systems  Constitutional: Negative for fever.  Respiratory: Negative for shortness of breath.   Cardiovascular: Negative for chest pain.  Skin: Negative.   Neurological: Negative for tingling and sensory change.     Past Medical History:  Diagnosis Date  . Anxiety   . Arthritis   . High cholesterol   . HTN (hypertension)   . Migraines     Past Surgical History:  Procedure Laterality Date  . CATARACT EXTRACTION W/PHACO Right 01/27/2019   Procedure: CATARACT EXTRACTION PHACO AND INTRAOCULAR LENS PLACEMENT (IOC);  Surgeon: Baruch Goldmann, MD;  Location: [facility];  Service: Ophthalmology;  Laterality: Right;  CDE: 8.33  . CATARACT EXTRACTION W/PHACO Left 02/10/2019   Procedure: CATARACT EXTRACTION PHACO AND INTRAOCULAR LENS PLACEMENT (IOC);  Surgeon: Baruch Goldmann, MD;  Location: [facility];  Service: Ophthalmology;  Laterality: Left;  CDE: 5.49  . CESAREAN SECTION    . COLONOSCOPY  06/19/2012   Procedure: COLONOSCOPY;  Surgeon: Rogene Houston, MD;  Location: [facility];  Service: Endoscopy;  Laterality: N/A;  1030  . ORIF RADIAL FRACTURE Right 08/27/2016   Procedure: OPEN REDUCTION INTERNAL FIXATION (ORIF) RADIAL FRACTURE/DISTAL;  Surgeon: Dayna Barker, MD;  Location: [place];  Service: Plastics;  Laterality: Right;  . TONSILLECTOMY AND  ADENOIDECTOMY      Family History  Problem Relation Age of Onset  . Heart disease Other   . Dementia Mother   . Heart attack Brother   . Heart attack Brother        x2   Social History   Tobacco Use  . Smoking status: Never Smoker  . Smokeless tobacco: Never Used  Substance Use Topics  . Alcohol use: Yes    Comment: occasional  . Drug use: No    Allergies  Allergen Reactions  . Sulfonamide Derivatives Swelling    Current Meds  Medication Sig  . acetaminophen (TYLENOL) 325 MG tablet Take 650 mg by mouth every 6 (six) hours as needed for moderate pain.  Marland Kitchen albuterol (PROVENTIL HFA;VENTOLIN HFA) 108 (90 Base) MCG/ACT inhaler Inhale 2 puffs into the lungs every 6 (six) hours as needed for wheezing or shortness of breath.  . Coenzyme Q10 (CO Q-10) 100 MG CAPS Take 100 mg by mouth daily.  . diclofenac (VOLTAREN) 75 MG EC tablet TAKE ONE TABLET (75MG  TOTAL) BY MOUTH TWO TIMES DAILY  . levothyroxine (SYNTHROID, LEVOTHROID) 50 MCG tablet Take 50 mcg by mouth daily before breakfast.   . losartan (COZAAR) 50 MG tablet Take 50 mg by mouth daily.  Marland Kitchen omeprazole (PRILOSEC) 40 MG capsule Take 40 mg by mouth daily as needed (heartburn).   Vladimir Faster Glycol-Propyl Glycol (SYSTANE OP) Apply 1 drop to eye 2 (two) times daily as needed (dry eyes).   . rosuvastatin (CRESTOR) 5 MG tablet Take 5 mg by mouth daily.   Marland Kitchen  venlafaxine XR (EFFEXOR-XR) 37.5 MG 24 hr capsule Take 37.5 mg by mouth daily with breakfast.     BP (!) 139/100   Pulse 75   Temp 97.8 F (36.6 C)   Ht 5\' 8"  (1.727 m)   Wt 180 lb (81.6 kg)   BMI 27.37 kg/m   Physical Exam Vitals signs and nursing note reviewed.  Constitutional:      Appearance: Normal appearance.  Neurological:     Mental Status: She is alert and oriented to person, place, and time.  Psychiatric:        Mood and Affect: Mood normal.     Ortho Exam  Short stride gait varus alignment  Right knee medial tenderness Skin is normal Flexion 125  small effusion Strength normal No instability Muscle tone normal  Left knee Medial tenderness Range of motion 125 Ligaments stable Muscle strength and tone normal Skin normal Small effusion  Bilateral lower extremity neurovascular exam intact  MEDICAL DECISION SECTION  Xrays were done at [facility] bilateral total knees  My independent reading of xrays:  See report  Both knees have varus alignment moderate to severe osteoarthritis medial compartment  Encounter Diagnosis  Name Primary?  . Bilateral chronic knee pain Yes    PLAN: (Rx., injectx, surgery, frx, mri/ct) Inject both knees Offer osteoarthritis braces Continue NSAIDs Follow-up 3 months   Procedure note left knee injection verbal consent was obtained to inject left knee joint  Timeout was completed to confirm the site of injection  The medications used were 40 mg of Depo-Medrol and 1% lidocaine 3 cc  Anesthesia was provided by ethyl chloride and the skin was prepped with alcohol.  After cleaning the skin with alcohol a 20-gauge needle was used to inject the left knee joint. There were no complications. A sterile bandage was applied.   Procedure note right knee injection verbal consent was obtained to inject right knee joint  Timeout was completed to confirm the site of injection  The medications used were 40 mg of Depo-Medrol and 1% lidocaine 3 cc  Anesthesia was provided by ethyl chloride and the skin was prepped with alcohol.  After cleaning the skin with alcohol a 20-gauge needle was used to inject the right knee joint. There were no complications. A sterile bandage was applied.    No orders of the defined types were placed in this encounter.   Arther Abbott, MD  04/04/2019 9:01 AM

## 2019-05-30 ENCOUNTER — Other Ambulatory Visit: Payer: Self-pay

## 2019-05-30 ENCOUNTER — Ambulatory Visit (HOSPITAL_COMMUNITY)
Admission: RE | Admit: 2019-05-30 | Discharge: 2019-05-30 | Disposition: A | Discharge status: Home / Self Care | Payer: Medicare Other | Source: Ambulatory Visit | Attending: Family Medicine | Admitting: Family Medicine

## 2019-05-30 ENCOUNTER — Other Ambulatory Visit: Payer: Self-pay | Admitting: Internal Medicine

## 2019-05-30 ENCOUNTER — Other Ambulatory Visit: Payer: Medicare Other

## 2019-05-30 DIAGNOSIS — E2839 Other primary ovarian failure: Secondary | ICD-10-CM | POA: Diagnosis not present

## 2019-05-30 DIAGNOSIS — Z20822 Contact with and (suspected) exposure to covid-19: Secondary | ICD-10-CM

## 2019-05-30 DIAGNOSIS — Z1239 Encounter for other screening for malignant neoplasm of breast: Secondary | ICD-10-CM

## 2019-05-30 DIAGNOSIS — Z780 Asymptomatic menopausal state: Secondary | ICD-10-CM | POA: Diagnosis not present

## 2019-05-30 DIAGNOSIS — Z1231 Encounter for screening mammogram for malignant neoplasm of breast: Secondary | ICD-10-CM | POA: Diagnosis not present

## 2019-05-30 DIAGNOSIS — R6889 Other general symptoms and signs: Secondary | ICD-10-CM | POA: Diagnosis not present

## 2019-05-30 DIAGNOSIS — M8589 Other specified disorders of bone density and structure, multiple sites: Secondary | ICD-10-CM | POA: Diagnosis not present

## 2019-06-05 LAB — NOVEL CORONAVIRUS, NAA: SARS-CoV-2, NAA: NOT DETECTED

## 2019-06-27 ENCOUNTER — Other Ambulatory Visit: Payer: Self-pay | Admitting: Orthopedic Surgery

## 2019-07-07 ENCOUNTER — Ambulatory Visit: Payer: Medicare Other

## 2019-07-07 ENCOUNTER — Encounter: Payer: Self-pay | Admitting: Orthopedic Surgery

## 2019-07-07 ENCOUNTER — Ambulatory Visit (INDEPENDENT_AMBULATORY_CARE_PROVIDER_SITE_OTHER): Payer: Medicare Other | Admitting: Orthopedic Surgery

## 2019-07-07 ENCOUNTER — Other Ambulatory Visit: Payer: Self-pay

## 2019-07-07 VITALS — BP 140/120 | HR 77 | Temp 98.3°F | Ht 68.0 in | Wt 175.0 lb

## 2019-07-07 DIAGNOSIS — M25512 Pain in left shoulder: Secondary | ICD-10-CM | POA: Diagnosis not present

## 2019-07-07 DIAGNOSIS — G8929 Other chronic pain: Secondary | ICD-10-CM

## 2019-07-07 DIAGNOSIS — M7552 Bursitis of left shoulder: Secondary | ICD-10-CM | POA: Diagnosis not present

## 2019-07-07 NOTE — Progress Notes (Signed)
Diana Skinner  07/07/2019  HISTORY SECTION :  Chief Complaint  Patient presents with  . Knee Pain    bilateral / feels better  . Shoulder Pain    left for several months painful ROM    68 year old female came in for evaluation of both knees as a recheck.  Patient is able to perform all of her activities this summer including mowing the grass.  She says they are doing fairly well but she has a new problem with her left shoulder.  75 mg diclofenac 1 twice daily  Pain left posterior shoulder It is always bothered her but worse in the last month Dull aching posterior shoulder posterior upper arm Moderate in severity Associated with decreased range of motion    Review of Systems  Respiratory: Negative for shortness of breath.   Cardiovascular: Negative for chest pain.  Musculoskeletal: Negative for neck pain.  Neurological: Negative for tingling.     Past Medical History:  Diagnosis Date  . Anxiety   . Arthritis   . High cholesterol   . HTN (hypertension)   . Migraines     Past Surgical History:  Procedure Laterality Date  . CATARACT EXTRACTION W/PHACO Right 01/27/2019   Procedure: CATARACT EXTRACTION PHACO AND INTRAOCULAR LENS PLACEMENT (IOC);  Surgeon: Baruch Goldmann, MD;  Location: [facility];  Service: Ophthalmology;  Laterality: Right;  CDE: 8.33  . CATARACT EXTRACTION W/PHACO Left 02/10/2019   Procedure: CATARACT EXTRACTION PHACO AND INTRAOCULAR LENS PLACEMENT (IOC);  Surgeon: Baruch Goldmann, MD;  Location: [facility];  Service: Ophthalmology;  Laterality: Left;  CDE: 5.49  . CESAREAN SECTION    . COLONOSCOPY  06/19/2012   Procedure: COLONOSCOPY;  Surgeon: Rogene Houston, MD;  [facility];  Service: Endoscopy;  Laterality: N/A;  1030  . ORIF RADIAL FRACTURE Right 08/27/2016   Procedure: OPEN REDUCTION INTERNAL FIXATION (ORIF) RADIAL FRACTURE/DISTAL;  Surgeon: Dayna Barker, MD;  Location: [facility];  Service: Plastics;  Laterality: Right;  . TONSILLECTOMY AND  ADENOIDECTOMY       Allergies  Allergen Reactions  . Sulfonamide Derivatives Swelling     Current Outpatient Medications:  .  acetaminophen (TYLENOL) 325 MG tablet, Take 650 mg by mouth every 6 (six) hours as needed for moderate pain., Disp: , Rfl:  .  albuterol (PROVENTIL HFA;VENTOLIN HFA) 108 (90 Base) MCG/ACT inhaler, Inhale 2 puffs into the lungs every 6 (six) hours as needed for wheezing or shortness of breath., Disp: , Rfl:  .  Coenzyme Q10 (CO Q-10) 100 MG CAPS, Take 100 mg by mouth daily., Disp: , Rfl:  .  diclofenac (VOLTAREN) 75 MG EC tablet, TAKE ONE TABLET (75MG  TOTAL) BY MOUTH TWO TIMES DAILY, Disp: 60 tablet, Rfl: 2 .  levothyroxine (SYNTHROID, LEVOTHROID) 50 MCG tablet, Take 50 mcg by mouth daily before breakfast. , Disp: , Rfl: 2 .  losartan (COZAAR) 50 MG tablet, Take 50 mg by mouth daily., Disp: , Rfl:  .  omeprazole (PRILOSEC) 40 MG capsule, Take 40 mg by mouth daily as needed (heartburn). , Disp: , Rfl:  .  Polyethyl Glycol-Propyl Glycol (SYSTANE OP), Apply 1 drop to eye 2 (two) times daily as needed (dry eyes). , Disp: , Rfl:  .  rosuvastatin (CRESTOR) 5 MG tablet, Take 5 mg by mouth daily. , Disp: , Rfl:  .  venlafaxine XR (EFFEXOR-XR) 37.5 MG 24 hr capsule, Take 37.5 mg by mouth daily with breakfast. , Disp: , Rfl: 10   PHYSICAL EXAM SECTION: 1) BP Marland Kitchen)  140/120   Pulse 77   Temp 98.3 F (36.8 C)   Ht 5\' 8"  (1.727 m)   Wt 175 lb (79.4 kg)   BMI 26.61 kg/m   Body mass index is 26.61 kg/m. General appearance: Well-developed well-nourished no gross deformities  2) Cardiovascular normal pulse and perfusion in all 4 extremities normal color without edema  3) Neurologically deep tendon reflexes are equal and normal, no sensation loss or deficits no pathologic reflexes  4) Psychological: Awake alert and oriented x3 mood and affect normal  5) Skin no lacerations or ulcerations no nodularity no palpable masses, no erythema or nodularity  6) Musculoskeletal:    Right shoulder full range of motion no instability normal strength negative impingement sign normal cuff strength  Left shoulder I could not reproduce the pain but the pain was reproduced with reaching behind her back and reaching overhead her cuff was intact to manual muscle testing no instability in abduction external rotation there was no swelling  MEDICAL DECISION SECTION:  Encounter Diagnoses  Name Primary?  . Chronic left shoulder pain Yes  . Bursitis of left shoulder     Imaging Shoulder x-ray normal shoulder some tuberosity sclerosis near the greater tuberosity but otherwise flat acromion  Plan:  (Rx., Inj., surg., Frx, MRI/CT, XR:2) Procedure note the subacromial injection shoulder left   Verbal consent was obtained to inject the  Left   Shoulder  Timeout was completed to confirm the injection site is a subacromial space of the  left  shoulder  Medication used Depo-Medrol 40 mg and lidocaine 1% 3 cc  Anesthesia was provided by ethyl chloride  The injection was performed in the left  posterior subacromial space. After pinning the skin with alcohol and anesthetized the skin with ethyl chloride the subacromial space was injected using a 20-gauge needle. There were no complications  Sterile dressing was applied.  Exercise program follow-up 6 weeks    3:31 PM Arther Abbott, MD  07/07/2019

## 2019-07-07 NOTE — Patient Instructions (Signed)
Shoulder Pain Many things can cause shoulder pain, including:  An injury to the shoulder.  Overuse of the shoulder.  Arthritis. The source of the pain can be:  Inflammation.  An injury to the shoulder joint.  An injury to a tendon, ligament, or bone. Follow these instructions at home: Pay attention to changes in your symptoms. Let your health care provider know about them. Follow these instructions to relieve your pain. If you have a sling:  Wear the sling as told by your health care provider. Remove it only as told by your health care provider.  Loosen the sling if your fingers tingle, become numb, or turn cold and blue.  Keep the sling clean.  If the sling is not waterproof: ? Do not let it get wet. Remove it to shower or bathe.  Move your arm as little as possible, but keep your hand moving to prevent swelling. Managing pain, stiffness, and swelling   If directed, put ice on the painful area: ? Put ice in a plastic bag. ? Place a towel between your skin and the bag. ? Leave the ice on for 20 minutes, 2-3 times per day. Stop applying ice if it does not help with the pain.  Squeeze a soft ball or a foam pad as much as possible. This helps to keep the shoulder from swelling. It also helps to strengthen the arm. General instructions  Take over-the-counter and prescription medicines only as told by your health care provider.  Keep all follow-up visits as told by your health care provider. This is important. Contact a health care provider if:  Your pain gets worse.  Your pain is not relieved with medicines.  New pain develops in your arm, hand, or fingers. Get help right away if:  Your arm, hand, or fingers: ? Tingle. ? Become numb. ? Become swollen. ? Become painful. ? Turn white or blue. Summary  Shoulder pain can be caused by an injury, overuse, or arthritis.  Pay attention to changes in your symptoms. Let your health care provider know about them.   This condition may be treated with a sling, ice, and pain medicines.  Contact your health care provider if the pain gets worse or new pain develops. Get help right away if your arm, hand, or fingers tingle or become numb, swollen, or painful.  Keep all follow-up visits as told by your health care provider. This is important. This information is not intended to replace advice given to you by your health care provider. Make sure you discuss any questions you have with your health care provider. Document Released: 08/30/2005 Document Revised: 06/04/2018 Document Reviewed: 06/04/2018 Elsevier Patient Education  2020 [employer]  

## 2019-08-18 ENCOUNTER — Ambulatory Visit: Payer: Medicare Other | Admitting: Orthopedic Surgery

## 2019-10-01 DIAGNOSIS — E039 Hypothyroidism, unspecified: Secondary | ICD-10-CM | POA: Diagnosis not present

## 2019-10-01 DIAGNOSIS — Z1389 Encounter for screening for other disorder: Secondary | ICD-10-CM | POA: Diagnosis not present

## 2019-10-01 DIAGNOSIS — Z23 Encounter for immunization: Secondary | ICD-10-CM | POA: Diagnosis not present

## 2019-10-01 DIAGNOSIS — I10 Essential (primary) hypertension: Secondary | ICD-10-CM | POA: Diagnosis not present

## 2019-10-01 DIAGNOSIS — E7849 Other hyperlipidemia: Secondary | ICD-10-CM | POA: Diagnosis not present

## 2019-10-21 ENCOUNTER — Other Ambulatory Visit: Payer: Self-pay | Admitting: Orthopedic Surgery

## 2020-01-30 DIAGNOSIS — Z0000 Encounter for general adult medical examination without abnormal findings: Secondary | ICD-10-CM | POA: Diagnosis not present

## 2020-01-30 DIAGNOSIS — Z1389 Encounter for screening for other disorder: Secondary | ICD-10-CM | POA: Diagnosis not present

## 2020-01-30 DIAGNOSIS — I10 Essential (primary) hypertension: Secondary | ICD-10-CM | POA: Diagnosis not present

## 2020-01-30 DIAGNOSIS — E039 Hypothyroidism, unspecified: Secondary | ICD-10-CM | POA: Diagnosis not present

## 2020-01-30 DIAGNOSIS — E7849 Other hyperlipidemia: Secondary | ICD-10-CM | POA: Diagnosis not present

## 2020-02-09 ENCOUNTER — Other Ambulatory Visit: Payer: Self-pay | Admitting: Orthopedic Surgery

## 2020-02-23 ENCOUNTER — Encounter: Payer: Self-pay | Admitting: Orthopedic Surgery

## 2020-02-23 ENCOUNTER — Other Ambulatory Visit: Payer: Self-pay

## 2020-02-23 ENCOUNTER — Ambulatory Visit: Payer: Medicare Other | Admitting: Orthopedic Surgery

## 2020-02-23 VITALS — BP 132/85 | HR 72 | Ht 67.0 in | Wt 167.0 lb

## 2020-02-23 DIAGNOSIS — M25561 Pain in right knee: Secondary | ICD-10-CM | POA: Diagnosis not present

## 2020-02-23 DIAGNOSIS — M25512 Pain in left shoulder: Secondary | ICD-10-CM

## 2020-02-23 DIAGNOSIS — M25562 Pain in left knee: Secondary | ICD-10-CM

## 2020-02-23 DIAGNOSIS — G8929 Other chronic pain: Secondary | ICD-10-CM

## 2020-02-23 NOTE — Progress Notes (Signed)
Chief Complaint  Patient presents with  . Knee Pain    bilateral    LEFT SHOULDER PAIN   First we will talk about the knees currently the knees are doing well occasional increase in pain but usually resolves.  No catching locking giving way has trouble going down the steps  The left shoulder is bothering her again she cannot reach behind her back to do anything and hurts at night hurts to lie on the left side although her flexion and abduction seem to be okay  Review of systems no back pain no numbness or tingling in the left hand or left leg  Past Medical History:  Diagnosis Date  . Anxiety   . Arthritis   . High cholesterol   . HTN (hypertension)   . Migraines    Physical Exam Vitals and nursing note reviewed.  Constitutional:      Appearance: Normal appearance.  Neurological:     Mental Status: She is alert and oriented to person, place, and time.  Psychiatric:        Mood and Affect: Mood normal.    Right shoulder normal skin normal range of motion normal strength normal stability normal motor function  Left shoulder normal skin mild tenderness anterolateral acromion normal range of motion except for abduction internal rotation and internal rotation to the spine we see a 3 to level difference right to left with the left being less, strength is normal  Mild impingement  Medial joint line tenderness is mild in the right and left knee skin normal bilaterally strength and muscle tone normal in each leg no effusion in either knee  Encounter Diagnoses  Name Primary?  . Chronic left shoulder pain Yes  . Chronic pain of right knee   . Chronic pain of left knee     Recommend x-rays in a year regarding the knees we injected the shoulder and stressed stretching of the internal rotation contracture  Procedure note the subacromial injection shoulder left   Verbal consent was obtained to inject the  Left   Shoulder  Timeout was completed to confirm the injection site is a  subacromial space of the  left  shoulder  Medication used Depo-Medrol 40 mg and lidocaine 1% 3 cc  Anesthesia was provided by ethyl chloride  The injection was performed in the left  posterior subacromial space. After pinning the skin with alcohol and anesthetized the skin with ethyl chloride the subacromial space was injected using a 20-gauge needle. There were no complications  Sterile dressing was applied.  Encounter Diagnoses  Name Primary?  . Chronic left shoulder pain Yes  . Chronic pain of right knee   . Chronic pain of left knee    Follow-up in a year x-rays both knees

## 2020-02-23 NOTE — Patient Instructions (Signed)
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 15% of patients will have increased pain within the 24 hours postinjection.   This is transient and will go away.   We recommend that you use ice packs on the injection site for 20 minutes every 2 hours and extra strength Tylenol 2 tablets every 8 as needed until the pain resolves.  If you continue to have pain after taking the Tylenol and using the ice please call the office for further instructions.

## 2020-05-24 ENCOUNTER — Other Ambulatory Visit: Payer: Self-pay | Admitting: Orthopedic Surgery

## 2020-07-16 DIAGNOSIS — I10 Essential (primary) hypertension: Secondary | ICD-10-CM | POA: Diagnosis not present

## 2020-07-16 DIAGNOSIS — E063 Autoimmune thyroiditis: Secondary | ICD-10-CM | POA: Diagnosis not present

## 2020-09-08 ENCOUNTER — Other Ambulatory Visit: Payer: Self-pay | Admitting: Orthopedic Surgery

## 2020-09-10 NOTE — Telephone Encounter (Signed)
Rx refill request

## 2020-12-16 ENCOUNTER — Other Ambulatory Visit: Payer: Self-pay | Admitting: Orthopedic Surgery

## 2021-01-19 DIAGNOSIS — D225 Melanocytic nevi of trunk: Secondary | ICD-10-CM | POA: Diagnosis not present

## 2021-01-19 DIAGNOSIS — Z1283 Encounter for screening for malignant neoplasm of skin: Secondary | ICD-10-CM | POA: Diagnosis not present

## 2021-02-10 DIAGNOSIS — I10 Essential (primary) hypertension: Secondary | ICD-10-CM | POA: Diagnosis not present

## 2021-02-10 DIAGNOSIS — E039 Hypothyroidism, unspecified: Secondary | ICD-10-CM | POA: Diagnosis not present

## 2021-02-10 DIAGNOSIS — E7849 Other hyperlipidemia: Secondary | ICD-10-CM | POA: Diagnosis not present

## 2021-02-10 DIAGNOSIS — Z1389 Encounter for screening for other disorder: Secondary | ICD-10-CM | POA: Diagnosis not present

## 2021-02-10 DIAGNOSIS — Z23 Encounter for immunization: Secondary | ICD-10-CM | POA: Diagnosis not present

## 2021-02-10 DIAGNOSIS — Z0001 Encounter for general adult medical examination with abnormal findings: Secondary | ICD-10-CM | POA: Diagnosis not present

## 2021-02-23 ENCOUNTER — Ambulatory Visit: Payer: Medicare Other | Admitting: Orthopedic Surgery

## 2021-03-07 ENCOUNTER — Ambulatory Visit: Payer: Medicare Other

## 2021-03-07 ENCOUNTER — Other Ambulatory Visit: Payer: Self-pay

## 2021-03-07 ENCOUNTER — Encounter: Payer: Self-pay | Admitting: Orthopedic Surgery

## 2021-03-07 ENCOUNTER — Ambulatory Visit: Payer: Medicare Other | Admitting: Orthopedic Surgery

## 2021-03-07 VITALS — BP 154/95 | HR 80 | Ht 67.0 in | Wt 173.0 lb

## 2021-03-07 DIAGNOSIS — M170 Bilateral primary osteoarthritis of knee: Secondary | ICD-10-CM | POA: Diagnosis not present

## 2021-03-07 DIAGNOSIS — G8929 Other chronic pain: Secondary | ICD-10-CM

## 2021-03-07 DIAGNOSIS — M25561 Pain in right knee: Secondary | ICD-10-CM

## 2021-03-07 DIAGNOSIS — M25562 Pain in left knee: Secondary | ICD-10-CM

## 2021-03-07 NOTE — Progress Notes (Signed)
Chief Complaint  Patient presents with  . Knee Pain    Bilateral/doing pretty good except for the real cold days.    Encounter Diagnoses  Name Primary?  . Chronic pain of right knee Yes  . Chronic pain of left knee     70 year old female with chronic arthritis in both knees has been doing very well lately walking 1 to 2 miles for exercise no swelling no catching no locking and only pain on cold days  Physical Exam Constitutional:      General: She is not in acute distress.    Appearance: She is well-developed.     Comments: Well developed, well nourished Normal grooming and hygiene     Cardiovascular:     Comments: No peripheral edema Musculoskeletal:     Right knee: Crepitus present. No swelling. Normal range of motion. No tenderness.     Instability Tests: Anterior drawer test negative. Posterior drawer test negative.     Left knee: No swelling or crepitus. Normal range of motion. No tenderness.     Instability Tests: Anterior drawer test negative. Posterior drawer test negative.     Comments: Mild varus B/L  Skin:    General: Skin is warm and dry.  Neurological:     Mental Status: She is alert and oriented to person, place, and time.     Sensory: No sensory deficit.     Coordination: Coordination normal.     Gait: Gait normal.     Deep Tendon Reflexes: Reflexes are normal and symmetric.  Psychiatric:        Mood and Affect: Mood normal.        Behavior: Behavior normal.        Thought Content: Thought content normal.        Judgment: Judgment normal.     Comments: Affect normal     Left knee x-ray moderate arthritis medial compartment mild varus  Right knee x-ray moderate arthritis medial compartment with mild varus  Encounter Diagnoses  Name Primary?  . Chronic pain of right knee Yes  . Chronic pain of left knee     Assessment and plan stable knees on x-ray and functionally she is doing very well  X-ray again in a year  Coding to chronic illnesses  stable right knee arthritis and chronic pain x-ray both knees low risk of morbidity at present

## 2021-04-02 ENCOUNTER — Other Ambulatory Visit: Payer: Self-pay | Admitting: Orthopedic Surgery

## 2021-06-28 DIAGNOSIS — U071 COVID-19: Secondary | ICD-10-CM | POA: Diagnosis not present

## 2021-08-04 ENCOUNTER — Other Ambulatory Visit: Payer: Self-pay | Admitting: Orthopedic Surgery

## 2021-09-29 ENCOUNTER — Other Ambulatory Visit: Payer: Self-pay | Admitting: Family Medicine

## 2021-09-29 DIAGNOSIS — Z139 Encounter for screening, unspecified: Secondary | ICD-10-CM

## 2021-10-03 ENCOUNTER — Other Ambulatory Visit: Payer: Self-pay

## 2021-10-03 ENCOUNTER — Ambulatory Visit
Admission: RE | Admit: 2021-10-03 | Discharge: 2021-10-03 | Disposition: A | Discharge status: Home / Self Care | Payer: Medicare Other | Source: Ambulatory Visit | Attending: Family Medicine | Admitting: Family Medicine

## 2021-10-03 DIAGNOSIS — Z139 Encounter for screening, unspecified: Secondary | ICD-10-CM

## 2021-10-03 DIAGNOSIS — Z1231 Encounter for screening mammogram for malignant neoplasm of breast: Secondary | ICD-10-CM | POA: Diagnosis not present

## 2021-11-10 ENCOUNTER — Other Ambulatory Visit: Payer: Self-pay | Admitting: Orthopedic Surgery

## 2022-01-18 ENCOUNTER — Other Ambulatory Visit (HOSPITAL_COMMUNITY): Payer: Self-pay | Admitting: Physician Assistant

## 2022-01-18 DIAGNOSIS — E2839 Other primary ovarian failure: Secondary | ICD-10-CM

## 2022-02-23 DIAGNOSIS — E782 Mixed hyperlipidemia: Secondary | ICD-10-CM | POA: Diagnosis not present

## 2022-02-23 DIAGNOSIS — R7309 Other abnormal glucose: Secondary | ICD-10-CM | POA: Diagnosis not present

## 2022-02-23 DIAGNOSIS — E039 Hypothyroidism, unspecified: Secondary | ICD-10-CM | POA: Diagnosis not present

## 2022-02-23 DIAGNOSIS — I10 Essential (primary) hypertension: Secondary | ICD-10-CM | POA: Diagnosis not present

## 2022-02-23 DIAGNOSIS — E7849 Other hyperlipidemia: Secondary | ICD-10-CM | POA: Diagnosis not present

## 2022-02-23 DIAGNOSIS — Z0001 Encounter for general adult medical examination with abnormal findings: Secondary | ICD-10-CM | POA: Diagnosis not present

## 2022-02-23 DIAGNOSIS — G43909 Migraine, unspecified, not intractable, without status migrainosus: Secondary | ICD-10-CM | POA: Diagnosis not present

## 2022-03-09 ENCOUNTER — Ambulatory Visit: Payer: Medicare Other | Admitting: Orthopedic Surgery

## 2022-03-09 ENCOUNTER — Other Ambulatory Visit: Payer: Self-pay | Admitting: Orthopedic Surgery

## 2022-03-13 ENCOUNTER — Telehealth: Payer: Self-pay

## 2022-03-13 NOTE — Telephone Encounter (Signed)
Returned patients call. No answer, no vm. She needs an appt. Last seen in April 2022. That is why the refill was denied.  ?

## 2022-03-13 NOTE — Telephone Encounter (Signed)
Diclofenac Sodium 75 MG  Qty 60 Tablets ? ?TAKE ONE TABLET (75 MG) BY MOUTH TWO TIMES DAILY ? ?PATIENT USES [facility] ?

## 2022-03-14 NOTE — Telephone Encounter (Signed)
Patient has rescheduled her appointment she missed on 4/6 to 4/17. I told her that she hasn't been in since last year and that is why her medication was denied. ?

## 2022-03-14 NOTE — Telephone Encounter (Signed)
Called patient to discuss refill and why it was denied. No answer, no vm. Call x 2 ?

## 2022-03-14 NOTE — Telephone Encounter (Signed)
Noted  

## 2022-03-20 ENCOUNTER — Ambulatory Visit: Payer: Medicare Other | Admitting: Orthopedic Surgery

## 2022-03-20 ENCOUNTER — Ambulatory Visit: Payer: Medicare Other

## 2022-03-20 ENCOUNTER — Ambulatory Visit (INDEPENDENT_AMBULATORY_CARE_PROVIDER_SITE_OTHER): Payer: Medicare Other

## 2022-03-20 DIAGNOSIS — G8929 Other chronic pain: Secondary | ICD-10-CM

## 2022-03-20 DIAGNOSIS — M25562 Pain in left knee: Secondary | ICD-10-CM

## 2022-03-20 DIAGNOSIS — M170 Bilateral primary osteoarthritis of knee: Secondary | ICD-10-CM

## 2022-03-20 DIAGNOSIS — M25561 Pain in right knee: Secondary | ICD-10-CM

## 2022-03-20 NOTE — Progress Notes (Signed)
FOLLOW UP  ? ?Encounter Diagnoses  ?Name Primary?  ? Chronic pain of right knee Yes  ? Chronic pain of left knee   ? Primary osteoarthritis of both knees   ? ? ? ?Chief Complaint  ?Patient presents with  ? Knee Pain  ?  1 yr follow up  ?Has been painful lately because she has been out of medicine  ? ? ? ?1 year follow-up bilateral knee pain osteoarthritis ? ?Patient tells me she push mows her lawn she can walk exercise the left knee hurts more than the right ? ?The left knee does not bend as good as the right ? ?Her exam shows that she ambulates without assistive device she has better flexion extension on the right than the left but both knees flex past 115 degrees.  She walks without a limp ? ?Her x-rays today show more arthritis on the medial side of the left knee than the medial side of the right knee but both are in varus is less than 10 degrees.  The joint space narrowing is more severe on the left and the sclerosis in the subchondral bone is worse as well she does have some peripheral osteophytes ? ?71 year old female stable arthritis both knees recommend she continue current activity level Voltaren 75 mg and see me in a year for x-rays both knees ?

## 2022-03-21 ENCOUNTER — Other Ambulatory Visit: Payer: Self-pay | Admitting: Orthopedic Surgery

## 2022-03-21 ENCOUNTER — Telehealth: Payer: Self-pay | Admitting: Radiology

## 2022-03-21 MED ORDER — DICLOFENAC SODIUM 75 MG PO TBEC: DELAYED_RELEASE_TABLET | ORAL | 2 refills | Status: DC

## 2022-03-21 NOTE — Telephone Encounter (Signed)
Patient called, Voltaren '75mg'$  prescription is not at pharmacy, please send in?  Thanks. ?

## 2022-03-21 NOTE — Progress Notes (Signed)
Meds ordered this encounter  ?Medications  ? diclofenac (VOLTAREN) 75 MG EC tablet  ?  Sig: TAKE ONE TABLET ('75MG'$  TOTAL) BY MOUTH TWO TIMES DAILY  ?  Dispense:  60 tablet  ?  Refill:  2  ? ? ?

## 2022-05-24 ENCOUNTER — Encounter (INDEPENDENT_AMBULATORY_CARE_PROVIDER_SITE_OTHER): Payer: Self-pay | Admitting: *Deleted

## 2022-07-17 ENCOUNTER — Other Ambulatory Visit: Payer: Self-pay | Admitting: Orthopedic Surgery

## 2022-10-13 ENCOUNTER — Other Ambulatory Visit: Payer: Self-pay | Admitting: Family Medicine

## 2022-10-13 DIAGNOSIS — Z1231 Encounter for screening mammogram for malignant neoplasm of breast: Secondary | ICD-10-CM

## 2022-10-20 ENCOUNTER — Ambulatory Visit
Admission: RE | Admit: 2022-10-20 | Discharge: 2022-10-20 | Disposition: A | Discharge status: Home / Self Care | Payer: Medicare Other | Source: Ambulatory Visit | Attending: Family Medicine | Admitting: Family Medicine

## 2022-10-20 DIAGNOSIS — Z1231 Encounter for screening mammogram for malignant neoplasm of breast: Secondary | ICD-10-CM | POA: Diagnosis not present

## 2022-11-17 ENCOUNTER — Other Ambulatory Visit: Payer: Self-pay | Admitting: Orthopedic Surgery

## 2023-02-01 ENCOUNTER — Encounter: Payer: Self-pay | Admitting: Radiology

## 2023-02-27 DIAGNOSIS — E7849 Other hyperlipidemia: Secondary | ICD-10-CM | POA: Diagnosis not present

## 2023-02-27 DIAGNOSIS — E782 Mixed hyperlipidemia: Secondary | ICD-10-CM | POA: Diagnosis not present

## 2023-02-27 DIAGNOSIS — I10 Essential (primary) hypertension: Secondary | ICD-10-CM | POA: Diagnosis not present

## 2023-02-27 DIAGNOSIS — E039 Hypothyroidism, unspecified: Secondary | ICD-10-CM | POA: Diagnosis not present

## 2023-02-27 DIAGNOSIS — Z0001 Encounter for general adult medical examination with abnormal findings: Secondary | ICD-10-CM | POA: Diagnosis not present

## 2023-02-28 ENCOUNTER — Encounter (INDEPENDENT_AMBULATORY_CARE_PROVIDER_SITE_OTHER): Payer: Self-pay | Admitting: *Deleted

## 2023-03-06 ENCOUNTER — Encounter (INDEPENDENT_AMBULATORY_CARE_PROVIDER_SITE_OTHER): Payer: Self-pay

## 2023-03-08 ENCOUNTER — Ambulatory Visit (INDEPENDENT_AMBULATORY_CARE_PROVIDER_SITE_OTHER): Payer: Medicare Other | Admitting: Gastroenterology

## 2023-03-14 ENCOUNTER — Other Ambulatory Visit: Payer: Self-pay | Admitting: Orthopedic Surgery

## 2023-03-22 ENCOUNTER — Other Ambulatory Visit: Payer: Self-pay

## 2023-03-22 ENCOUNTER — Ambulatory Visit: Payer: Medicare Other | Admitting: Orthopedic Surgery

## 2023-03-22 ENCOUNTER — Encounter: Payer: Self-pay | Admitting: Orthopedic Surgery

## 2023-03-22 ENCOUNTER — Other Ambulatory Visit (INDEPENDENT_AMBULATORY_CARE_PROVIDER_SITE_OTHER): Payer: Medicare Other

## 2023-03-22 VITALS — BP 93/70 | HR 80 | Ht 67.0 in | Wt 160.0 lb

## 2023-03-22 DIAGNOSIS — M170 Bilateral primary osteoarthritis of knee: Secondary | ICD-10-CM

## 2023-03-22 DIAGNOSIS — G8929 Other chronic pain: Secondary | ICD-10-CM

## 2023-03-22 NOTE — Progress Notes (Signed)
Chief Complaint  Patient presents with   Knee Pain    Bilateral     Encounter Diagnoses  Name Primary?   Chronic pain of right knee Yes   Chronic pain of left knee    Primary osteoarthritis of both knees     72 year old female with chronic knee pain from osteoarthritis  However she is functioning well.  She mows the lawn with a push mower she is taking care of her grandson she does her own housework she only had some discomfort during the winter months when it was cold or damp otherwise she is not taking any major medication for this knee pain  She is ambulatory without a limp she does have varus thrust in both knees from the varus osteoarthritis but her knee range of motion remains excellent her strength is good her x-rays show very slight narrowing of the medial side on the left compared to last year but nothing worse on the right  Plan is to x-ray again in a year.  She is functioning well so there is no need to intervene at this time

## 2023-06-18 ENCOUNTER — Other Ambulatory Visit: Payer: Self-pay | Admitting: Orthopedic Surgery

## 2023-08-09 ENCOUNTER — Encounter (INDEPENDENT_AMBULATORY_CARE_PROVIDER_SITE_OTHER): Payer: Self-pay | Admitting: *Deleted

## 2023-10-11 ENCOUNTER — Other Ambulatory Visit: Payer: Self-pay | Admitting: Orthopedic Surgery

## 2024-02-02 ENCOUNTER — Other Ambulatory Visit: Payer: Self-pay | Admitting: Orthopedic Surgery

## 2024-02-21 ENCOUNTER — Ambulatory Visit: Admitting: Orthopedic Surgery

## 2024-02-28 DIAGNOSIS — E7849 Other hyperlipidemia: Secondary | ICD-10-CM | POA: Diagnosis not present

## 2024-02-28 DIAGNOSIS — Z0001 Encounter for general adult medical examination with abnormal findings: Secondary | ICD-10-CM | POA: Diagnosis not present

## 2024-02-28 DIAGNOSIS — Z01818 Encounter for other preprocedural examination: Secondary | ICD-10-CM | POA: Diagnosis not present

## 2024-02-28 DIAGNOSIS — G43909 Migraine, unspecified, not intractable, without status migrainosus: Secondary | ICD-10-CM | POA: Diagnosis not present

## 2024-02-28 DIAGNOSIS — E039 Hypothyroidism, unspecified: Secondary | ICD-10-CM | POA: Diagnosis not present

## 2024-02-28 DIAGNOSIS — I10 Essential (primary) hypertension: Secondary | ICD-10-CM | POA: Diagnosis not present

## 2024-02-28 DIAGNOSIS — Z96659 Presence of unspecified artificial knee joint: Secondary | ICD-10-CM | POA: Diagnosis not present

## 2024-03-03 ENCOUNTER — Ambulatory Visit: Admitting: Orthopedic Surgery

## 2024-03-03 ENCOUNTER — Other Ambulatory Visit (INDEPENDENT_AMBULATORY_CARE_PROVIDER_SITE_OTHER)

## 2024-03-03 VITALS — Wt 165.0 lb

## 2024-03-03 DIAGNOSIS — M170 Bilateral primary osteoarthritis of knee: Secondary | ICD-10-CM

## 2024-03-03 DIAGNOSIS — Z01818 Encounter for other preprocedural examination: Secondary | ICD-10-CM | POA: Diagnosis not present

## 2024-03-03 DIAGNOSIS — M1712 Unilateral primary osteoarthritis, left knee: Secondary | ICD-10-CM

## 2024-03-03 NOTE — Progress Notes (Signed)
 Chief Complaint  Patient presents with   Follow-up    Recheck on bilateral knee pain    Oce[place] 73 years old she has bilateral knee pain bilateral knee arthritis left worse than right  She has been followed for almost 7 years with osteoarthritis and she is thinking it is time to do the left knee.  She had laboratory studies done with primary care Dr. Phillips Odor which were normal  And she is ready for surgery  Past Medical History:  Diagnosis Date   Anxiety    Arthritis    High cholesterol    HTN (hypertension)    Migraines    Past Surgical History:  Procedure Laterality Date   CATARACT EXTRACTION W/PHACO Right 01/27/2019   Procedure: CATARACT EXTRACTION PHACO AND INTRAOCULAR LENS PLACEMENT (IOC);  Surgeon: Fabio Pierce, MD;  Location: [facility];  Service: Ophthalmology;  Laterality: Right;  CDE: 8.33   CATARACT EXTRACTION W/PHACO Left 02/10/2019   Procedure: CATARACT EXTRACTION PHACO AND INTRAOCULAR LENS PLACEMENT (IOC);  Surgeon: Fabio Pierce, MD;  Location: [facility];  Service: Ophthalmology;  Laterality: Left;  CDE: 5.49   CESAREAN SECTION     COLONOSCOPY  06/19/2012   Procedure: COLONOSCOPY;  Surgeon: Malissa Hippo, MD;  Location: [facility];  Service: Endoscopy;  Laterality: N/A;  1030   ORIF RADIAL FRACTURE Right 08/27/2016   Procedure: OPEN REDUCTION INTERNAL FIXATION (ORIF) RADIAL FRACTURE/DISTAL;  Surgeon: Knute Neu, MD;  Location: [facility];  Service: Plastics;  Laterality: Right;   TONSILLECTOMY AND ADENOIDECTOMY     Family History  Problem Relation Age of Onset   Dementia Mother    Heart attack Brother    Heart attack Brother        x2   Heart disease Other    Breast cancer Neg Hx    Allergies  Allergen Reactions   Sulfonamide Derivatives Swelling    Current Outpatient Medications  Medication Instructions   acetaminophen (TYLENOL) 650 mg, Oral, Every 6 hours PRN   albuterol (PROVENTIL HFA;VENTOLIN HFA) 108 (90 Base) MCG/ACT inhaler 2 puffs, Inhalation, Every 6  hours PRN   diclofenac (VOLTAREN) 75 MG EC tablet TAKE ONE TABLET (75MG  TOTAL) BY MOUTH TWO TIMES DAILY   levothyroxine (SYNTHROID) 50 mcg, Oral, Daily before breakfast   losartan (COZAAR) 50 mg, Daily   Omega-3 Fatty Acids (FISH OIL) 1000 MG CAPS Oral   omeprazole (PRILOSEC) 40 mg, Daily PRN   Polyethyl Glycol-Propyl Glycol (SYSTANE OP) 1 drop, Ophthalmic, 2 times daily PRN   rosuvastatin (CRESTOR) 5 mg, Oral, Daily   venlafaxine XR (EFFEXOR-XR) 37.5 mg, Oral, Daily with breakfast   Wt 165 lb (74.8 kg)   BMI 25.84 kg/m   Physical Exam Vitals and nursing note reviewed.  Constitutional:      Appearance: Normal appearance.  HENT:     Head: Normocephalic and atraumatic.  Eyes:     General: No scleral icterus.       Right eye: No discharge.        Left eye: No discharge.     Extraocular Movements: Extraocular movements intact.     Conjunctiva/sclera: Conjunctivae normal.     Pupils: Pupils are equal, round, and reactive to light.  Cardiovascular:     Rate and Rhythm: Normal rate.     Pulses: Normal pulses.  Musculoskeletal:     Comments: Left knee  Skin is amenable to surgery Tenderness medial joint line swelling none joint effusion none Alignment is in varus Flexion arc is 125 degrees Stability  tests no evidence of instability  Skin:    General: Skin is warm and dry.     Capillary Refill: Capillary refill takes less than 2 seconds.  Neurological:     General: No focal deficit present.     Mental Status: She is alert and oriented to person, place, and time.     Gait: Gait abnormal.  Psychiatric:        Mood and Affect: Mood normal.        Behavior: Behavior normal.        Thought Content: Thought content normal.        Judgment: Judgment normal.    See imaging report but she has severe disease..  Encounter Diagnosis  Name Primary?   Primary osteoarthritis of both knees Yes    The procedure has been fully reviewed with the patient; The risks and benefits of  surgery have been discussed and explained and understood. Alternative treatment has also been reviewed, questions were encouraged and answered. The postoperative plan is also been reviewed.  Plan is for left total knee arthroplasty with cement  Overnight stay Schedule as close to May 22 as possible  One-story home with 5 steps to get in and out of the house

## 2024-03-03 NOTE — Progress Notes (Signed)
   Wt 165 lb (74.8 kg)   BMI 25.84 kg/m   Body mass index is 25.84 kg/m.  Chief Complaint  Patient presents with   Follow-up    Recheck on bilateral knee pain    Encounter Diagnosis  Name Primary?   Primary osteoarthritis of both knees Yes

## 2024-03-07 ENCOUNTER — Telehealth: Payer: Self-pay | Admitting: Radiology

## 2024-03-07 MED ORDER — BUPIVACAINE-MELOXICAM ER 400-12 MG/14ML IJ SOLN: 400.0000 mg | Freq: Once | INTRAMUSCULAR | Status: DC

## 2024-03-07 NOTE — Telephone Encounter (Signed)
 No he did not but its in her note I called her. She wants Diana Skinner now, so I have scheduled her and discussed with her.   She has [name] Advised her of the PT home and OP and to expect call from [facility] pre op

## 2024-03-07 NOTE — Telephone Encounter (Signed)
-----   Message from Nurse Tammy L sent at 03/05/2024  3:39 PM EDT ----- Diana Skinner just wanted to double check that DR. Romeo Apple sent you a staff message about scheduling her for surgery? Close to May 22 nd?

## 2024-03-07 NOTE — Addendum Note (Signed)
 Addended byCaffie Damme on: 03/07/2024 09:58 AM   Modules accepted: Orders

## 2024-03-14 NOTE — Patient Instructions (Addendum)
 Diana Skinner  03/14/2024     @PREFPERIOPPHARMACY @   Your procedure is scheduled on 03/25/2024.   Report to [facility] at  0600  A.M.    Call this number if you have problems the morning of surgery:  [phone]  If you experience any cold or flu symptoms such as cough, fever, chills, shortness of breath, etc. between now and your scheduled surgery, please notify us at the above number.   Remember:  Do not eat after midnight.   You may drink clear liquids until  0330 am on 03/25/2024.    Clear liquids allowed are:                    Water, Juice (No red color; non-citric and without pulp; diabetics please choose diet or no sugar options), Carbonated beverages (diabetics please choose diet or no sugar options), Clear Tea (No creamer, milk, or cream, including half & half and powdered creamer), Black Coffee Only (No creamer, milk or cream, including half & half and powdered creamer), and Clear Sports drink (No red color; diabetics please choose diet or no sugar options)    Take these medicines the morning of surgery with A SIP OF WATER                                               levothyroxine, venlafaxine.     Do not wear jewelry, make-up or nail polish, including gel polish,  artificial nails, or any other type of covering on natural nails (fingers and  toes).  Do not wear lotions, powders, or perfumes, or deodorant.  Do not shave 48 hours prior to surgery.  Men may shave face and neck.  Do not bring valuables to the hospital.  [facility] is not responsible for any belongings or valuables.  Contacts, dentures or bridgework may not be worn into surgery.  Leave your suitcase in the car.  After surgery it may be brought to your room.  For patients admitted to the hospital, discharge time will be determined by your treatment team.  Patients discharged the day of surgery will not be allowed to drive home.    Special instructions:  DO NOT smoke tobacco or vape for  24 hours before your procedure.  Please read over the following fact sheets that you were given. Pain Booklet, Coughing and Deep Breathing, Blood Transfusion Information, Total Joint Packet, MRSA Information, Surgical Site Infection Prevention, Anesthesia Post-op Instructions, and Care and Recovery After Surgery        Total Knee Replacement Surgery: What to Know After After a total knee replacement, it's common to have: Redness, pain, and swelling at the cut from surgery area. This is also called the incision area. Stiffness. Discomfort. A small amount of blood or clear fluid coming from your cut. Follow these instructions at home: Medicines Take your medicines only as told. You may need to take steps to help treat or prevent trouble pooping (constipation), such as: Taking medicines to help you poop. Eating foods high in fiber, like beans, whole grains, and fresh fruits and vegetables. Drinking more fluids as told. Ask your health care provider if it's safe to drive or use machines while taking your medicine. Bathing Do not take baths, swim, or use a hot tub until you're told it's OK. Ask if you  can shower. If you bandage isn't waterproof: Do not let it get wet. Cover it when you take a bath or shower. Use a cover that doesn't let any water in. Incision care Take care of your cut from surgery as told. Make sure you: Wash your hands with soap and water for at least 20 seconds before and after you change your bandage. If you can't use soap and water, use hand sanitizer. Change your bandage. Leave stitches or skin glue alone. Leave tape strips alone unless you're told to take them off. You may trim the edges of the tape strips if they curl up. Check the area around your cut every day for signs of infection. Check for: More redness, swelling, or pain. More fluid or blood. Warmth. Pus or a bad smell.  Activity Rest as told. Get up to take short walks at least every 2 hours  during the day. This helps you breathe better and keeps your blood flowing. Ask for help if you feel weak or unsteady. Follow instructions from your provider about using a walker, crutches, or a cane. You may use your legs to support your body weight as told by your provider. Follow instructions about how much weight you may safely support on your affected leg. You may be shown how to get out of a bed and chair and how to go up and down stairs. You will first do this with a walker, crutches, or a cane. Once you are able to walk without a limp, you may stop using a walker, crutches, or a cane, as told by your provider. Do exercises as told by your provider to help restore your strength and knee movement. Exercises will start as soon as possible after your surgery. You'll work with a physical therapist for up to a few months after your surgery. Avoid high-impact activities. Avoid running, jumping rope, and doing jumping jacks. Do not play contact sports until your provider approves. Ask what things are safe for you to do at home. Ask when you can go back to work or school. Managing pain, stiffness, and swelling  Use ice or an ice pack as told. Put ice in a plastic bag or use the cold flow pad or cold therapy unit that you were given. Place a towel between your skin and the bag or device. Leave the ice on for 20 minutes, 2-3 times a day. If your skin turns red, take off the ice right away to prevent skin damage. The risk of damage is higher if you can't feel pain, heat, or cold. Move your toes often to reduce stiffness and swelling. Raise your leg above the level of your heart while you are sitting or lying down. Use several pillows to keep your leg straight. Do not put a pillow just under the knee. If the knee is bent for a long time, this may lead to stiffness. Wear an elastic knee support as told by your provider. Safety To help prevent falls, keep floors clear. Put things that you may need  within easy reach. Wear an apron or tool belt with pockets for carrying things. This leaves your hands free to help with your balance. Ask your provider when it is safe to drive. General instructions Wear compression stockings to reduce swelling and prevent blood clots in your legs. Keep doing breathing exercises as told. This helps prevent lung infection. Do not smoke, vape, or use nicotine or tobacco. Do not have dental work or cleanings for at least 3  months. Ask your provider if you need to take antibiotics before you have dental work or have your teeth cleaned. Tell your dentist about your new joint. Keep all follow-up visits. Your provider will need to check how your knee is healing. Contact a health care provider if: You have a fever or chills. You have a cough or feel short of breath. You have very bad pain and medicine is not helping your pain. You have any signs of infection. You fall. You have increased swelling, redness, and calf pain. Your cut from surgery breaks open after your stitches or staples are taken out. Get help right away if: You have trouble breathing. You have a fast and irregular heartbeat. You have chest pain. These symptoms may be an emergency. Call 911 right away. Do not wait to see if the symptoms will go away. Do not drive yourself to the hospital. This information is not intended to replace advice given to you by your health care provider. Make sure you discuss any questions you have with your health care provider. Document Revised: 09/20/2023 Document Reviewed: 04/26/2023 Elsevier Patient Education  2024 [employer]General Anesthesia, Adult, Care After The following information offers guidance on how to care for yourself after your procedure. Your health care provider may also give you more specific instructions. If you have problems or questions, contact your health care provider. What can I expect after the procedure? After the procedure, it is  common for people to: Have pain or discomfort at the IV site. Have nausea or vomiting. Have a sore throat or hoarseness. Have trouble concentrating. Feel cold or chills. Feel weak, sleepy, or tired (fatigue). Have soreness and body aches. These can affect parts of the body that were not involved in surgery. Follow these instructions at home: For the time period you were told by your health care provider:  Rest. Do not participate in activities where you could fall or become injured. Do not drive or use machinery. Do not drink alcohol. Do not take sleeping pills or medicines that cause drowsiness. Do not make important decisions or sign legal documents. Do not take care of children on your own. General instructions Drink enough fluid to keep your urine pale yellow. If you have sleep apnea, surgery and certain medicines can increase your risk for breathing problems. Follow instructions from your health care provider about wearing your sleep device: Anytime you are sleeping, including during daytime naps. While taking prescription pain medicines, sleeping medicines, or medicines that make you drowsy. Return to your normal activities as told by your health care provider. Ask your health care provider what activities are safe for you. Take over-the-counter and prescription medicines only as told by your health care provider. Do not use any products that contain nicotine or tobacco. These products include cigarettes, chewing tobacco, and vaping devices, such as e-cigarettes. These can delay incision healing after surgery. If you need help quitting, ask your health care provider. Contact a health care provider if: You have nausea or vomiting that does not get better with medicine. You vomit every time you eat or drink. You have pain that does not get better with medicine. You cannot urinate or have bloody urine. You develop a skin rash. You have a fever. Get help right away if: You have  trouble breathing. You have chest pain. You vomit blood. These symptoms may be an emergency. Get help right away. Call 911. Do not wait to see if the symptoms will go away. Do not drive  yourself to the hospital. Summary After the procedure, it is common to have a sore throat, hoarseness, nausea, vomiting, or to feel weak, sleepy, or fatigue. For the time period you were told by your health care provider, do not drive or use machinery. Get help right away if you have difficulty breathing, have chest pain, or vomit blood. These symptoms may be an emergency. This information is not intended to replace advice given to you by your health care provider. Make sure you discuss any questions you have with your health care provider. Document Revised: 02/17/2022 Document Reviewed: 02/17/2022 Elsevier Patient Education  2024 [employer].How to Use an Incentive Spirometer An incentive spirometer is a tool that measures how well you are filling your lungs with each breath. Learning to take long, deep breaths using this tool can help you keep your lungs clear and active. This may help to reverse or lessen your chance of developing breathing (pulmonary) problems, especially infection. You may be asked to use a spirometer: After a surgery. If you have a lung problem or a history of smoking. After a long period of time when you have been unable to move or be active. If the spirometer includes an indicator to show the highest number that you have reached, your health care provider or respiratory therapist will help you set a goal. Keep a log of your progress as told by your health care provider. What are the risks? Breathing too quickly may cause dizziness or cause you to pass out. Take your time so you do not get dizzy or light-headed. If you are in pain, you may need to take pain medicine before doing incentive spirometry. It is harder to take a deep breath if you are having pain. How to use your incentive  spirometer  Sit up on the edge of your bed or on a chair. Hold the incentive spirometer so that it is in an upright position. Before you use the spirometer, breathe out normally. Place the mouthpiece in your mouth. Make sure your lips are closed tightly around it. Breathe in slowly and as deeply as you can through your mouth, causing the piston or the ball to rise toward the top of the chamber. Hold your breath for 3-5 seconds, or for as long as possible. If the spirometer includes a coach indicator, use this to guide you in breathing. Slow down your breathing if the indicator goes above the marked areas. Remove the mouthpiece from your mouth and breathe out normally. The piston or ball will return to the bottom of the chamber. Rest for a few seconds, then repeat the steps 10 or more times. Take your time and take a few normal breaths between deep breaths so that you do not get dizzy or light-headed. Do this every 1-2 hours when you are awake. If the spirometer includes a goal marker to show the highest number you have reached (best effort), use this as a goal to work toward during each repetition. After each set of 10 deep breaths, cough a few times. This will help to make sure that your lungs are clear. If you have an incision on your chest or abdomen from surgery, place a pillow or a rolled-up towel firmly against the incision when you cough. This can help to reduce pain while taking deep breaths and coughing. General tips When you are able to get out of bed: Walk around often. Continue to take deep breaths and cough in order to clear your lungs. Keep using  the incentive spirometer until your health care provider says it is okay to stop using it. If you have been in the hospital, you may be told to keep using the spirometer at home. Contact a health care provider if: You are having difficulty using the spirometer. You have trouble using the spirometer as often as instructed. Your pain  medicine is not giving enough relief for you to use the spirometer as told. You have a fever. Get help right away if: You develop shortness of breath. You develop a cough with bloody mucus from the lungs. You have fluid or blood coming from an incision site after you cough. Summary An incentive spirometer is a tool that can help you learn to take long, deep breaths to keep your lungs clear and active. You may be asked to use a spirometer after a surgery, if you have a lung problem or a history of smoking, or if you have been inactive for a long period of time. Use your incentive spirometer as instructed every 1-2 hours while you are awake. If you have an incision on your chest or abdomen, place a pillow or a rolled-up towel firmly against your incision when you cough. This will help to reduce pain. Get help right away if you have shortness of breath, you cough up bloody mucus, or blood comes from your incision when you cough. This information is not intended to replace advice given to you by your health care provider. Make sure you discuss any questions you have with your health care provider. Document Revised: 09/28/2023 Document Reviewed: 09/28/2023 Elsevier Patient Education  2024 [employer].

## 2024-03-18 ENCOUNTER — Encounter (HOSPITAL_COMMUNITY): Payer: Self-pay

## 2024-03-18 ENCOUNTER — Encounter (HOSPITAL_COMMUNITY)
Admission: RE | Admit: 2024-03-18 | Discharge: 2024-03-18 | Disposition: A | Discharge status: Home / Self Care | Source: Ambulatory Visit | Attending: Orthopedic Surgery | Admitting: Orthopedic Surgery

## 2024-03-18 VITALS — BP 124/84 | HR 67 | Temp 98.0°F | Ht 67.0 in | Wt 165.0 lb

## 2024-03-18 DIAGNOSIS — I10 Essential (primary) hypertension: Secondary | ICD-10-CM | POA: Diagnosis not present

## 2024-03-18 DIAGNOSIS — M1712 Unilateral primary osteoarthritis, left knee: Secondary | ICD-10-CM | POA: Diagnosis not present

## 2024-03-18 DIAGNOSIS — Z01818 Encounter for other preprocedural examination: Secondary | ICD-10-CM | POA: Diagnosis not present

## 2024-03-18 LAB — BASIC METABOLIC PANEL WITH GFR
Anion gap: 9 (ref 5–15)
BUN: 22 mg/dL (ref 8–23)
CO2: 25 mmol/L (ref 22–32)
Calcium: 9 mg/dL (ref 8.9–10.3)
Chloride: 103 mmol/L (ref 98–111)
Creatinine, Ser: 0.83 mg/dL (ref 0.44–1.00)
GFR, Estimated: >60 mL/min (ref >60)
Glucose, Bld: 107 mg/dL — ABNORMAL HIGH (ref 70–99)
Potassium: 4 mmol/L (ref 3.5–5.1)
Sodium: 137 mmol/L (ref 135–145)

## 2024-03-18 LAB — CBC WITH DIFFERENTIAL/PLATELET
Abs Immature Granulocytes: 0.01 10*3/uL (ref 0.00–0.07)
Basophils Absolute: 0 10*3/uL (ref 0.0–0.1)
Basophils Relative: 1 %
Eosinophils Absolute: 0.3 10*3/uL (ref 0.0–0.5)
Eosinophils Relative: 5 %
HCT: 39 % (ref 36.0–46.0)
Hemoglobin: 12.3 g/dL (ref 12.0–15.0)
Immature Granulocytes: 0 %
Lymphocytes Relative: 25 %
Lymphs Abs: 1.5 10*3/uL (ref 0.7–4.0)
MCH: 31.2 pg (ref 26.0–34.0)
MCHC: 31.5 g/dL (ref 30.0–36.0)
MCV: 99 fL (ref 80.0–100.0)
Monocytes Absolute: 0.5 10*3/uL (ref 0.1–1.0)
Monocytes Relative: 8 %
Neutro Abs: 3.7 10*3/uL (ref 1.7–7.7)
Neutrophils Relative %: 61 %
Platelets: 220 10*3/uL (ref 150–400)
RBC: 3.94 MIL/uL (ref 3.87–5.11)
RDW: 13.2 % (ref 11.5–15.5)
WBC: 6 10*3/uL (ref 4.0–10.5)
nRBC: 0 % (ref 0.0–0.2)

## 2024-03-18 LAB — SURGICAL PCR SCREEN
MRSA, PCR: NEGATIVE
Staphylococcus aureus: NEGATIVE

## 2024-03-18 LAB — PREPARE RBC (CROSSMATCH)

## 2024-03-21 ENCOUNTER — Ambulatory Visit: Payer: Medicare Other | Admitting: Orthopedic Surgery

## 2024-03-24 NOTE — H&P (Signed)
 TOTAL KNEE ADMISSION H&P  Patient is being admitted for left total knee arthroplasty.  Subjective:  Chief Complaint:left knee pain.  HPI: Diana Skinner, 73 y.o. female, has a history of pain and functional disability in the left knee due to arthritis and has failed non-surgical conservative treatments for greater than 12 weeks to includeNSAID's and/or analgesics, corticosteriod injections, flexibility and strengthening excercises, weight reduction as appropriate, and activity modification.  Onset of symptoms was gradual, starting >10 years ago with gradually worsening course since that time. The patient noted no past surgery on the left knee(s).  Patient currently rates pain in the left knee(s) severe with activity. Patient has worsening of pain with activity and weight bearing, pain that interferes with activities of daily living, pain with passive range of motion, and crepitus.  Patient has evidence of subchondral cysts, subchondral sclerosis, joint subluxation, and joint space narrowing by imaging studies.    Patient Active Problem List   Diagnosis Date Noted   Fracture of right wrist 08/27/2016   Bursitis of knee 05/30/2011   KNEE PAIN 01/10/2010   CLOSED FRACTURE OF UPPER END OF TIBIA 01/10/2010   DERANGEMENT MENISCUS 01/03/2010   Past Medical History:  Diagnosis Date   Anxiety    Arthritis    High cholesterol    HTN (hypertension)    Migraines     Past Surgical History:  Procedure Laterality Date   CATARACT EXTRACTION W/PHACO Right 01/27/2019   Procedure: CATARACT EXTRACTION PHACO AND INTRAOCULAR LENS PLACEMENT (IOC);  Surgeon: Tarri Farm, MD;  Location: [facility];  Service: Ophthalmology;  Laterality: Right;  CDE: 8.33   CATARACT EXTRACTION W/PHACO Left 02/10/2019   Procedure: CATARACT EXTRACTION PHACO AND INTRAOCULAR LENS PLACEMENT (IOC);  Surgeon: Tarri Farm, MD;  Location: [facility];  Service: Ophthalmology;  Laterality: Left;  CDE: 5.49   CESAREAN SECTION     COLONOSCOPY   06/19/2012   Procedure: COLONOSCOPY;  Surgeon: Ruby Corporal, MD;  Location: [facility] SUITE;  Service: Endoscopy;  Laterality: N/A;  1030   ORIF RADIAL FRACTURE Right 08/27/2016   Procedure: OPEN REDUCTION INTERNAL FIXATION (ORIF) RADIAL FRACTURE/DISTAL;  Surgeon: Mauricia South, MD;  Location: [facility] OR;  Service: Plastics;  Laterality: Right;   TONSILLECTOMY AND ADENOIDECTOMY      Current Facility-Administered Medications  Medication Dose Route Frequency Provider Last Rate Last Admin   bupivacaine -meloxicam  ER (ZYNRELEF ) injection 400 mg  400 mg Infiltration Once Darrin Emerald, MD       Current Outpatient Medications  Medication Sig Dispense Refill Last Dose/Taking   diclofenac  (VOLTAREN ) 75 MG EC tablet TAKE ONE TABLET (75MG  TOTAL) BY MOUTH TWO TIMES DAILY 60 tablet 2 Taking   levothyroxine  (SYNTHROID , LEVOTHROID) 50 MCG tablet Take 50 mcg by mouth daily before breakfast.   2 Taking   loratadine  (CLARITIN ) 10 MG tablet Take 10 mg by mouth daily.   Taking   losartan  (COZAAR ) 100 MG tablet Take 100 mg by mouth daily.   Taking   Omega-3 Fatty Acids (FISH OIL) 1200 MG CAPS Take 1,200 mg by mouth daily.   Taking   Polyethyl Glycol-Propyl Glycol (SYSTANE OP) Apply 1 drop to eye 2 (two) times daily as needed (dry eyes).    Taking As Needed   rosuvastatin  (CRESTOR ) 20 MG tablet Take 20 mg by mouth daily.   Taking   venlafaxine  XR (EFFEXOR -XR) 37.5 MG 24 hr capsule Take 37.5 mg by mouth daily with breakfast.   10 Taking   Allergies  Allergen Reactions  Sulfonamide Derivatives Swelling    Social History   Tobacco Use   Smoking status: Never   Smokeless tobacco: Never  Substance Use Topics   Alcohol  use: Yes    Comment: occasional - 6 beers per week    Family History  Problem Relation Age of Onset   Dementia Mother    Heart attack Brother    Heart attack Brother        x2   Heart disease Other    Breast cancer Neg Hx      Review of Systems  Constitutional:  Negative for fever  and unexpected weight change.  Gastrointestinal: Negative.   Genitourinary: Negative.   Neurological:  Negative for numbness.    Objective:  Physical Exam Vitals and nursing note reviewed.  Constitutional:      General: She is not in acute distress.    Appearance: Normal appearance. She is normal weight. She is not ill-appearing, toxic-appearing or diaphoretic.  HENT:     Head: Normocephalic and atraumatic.     Right Ear: External ear normal.     Left Ear: External ear normal.     Nose: Nose normal. No congestion or rhinorrhea.     Mouth/Throat:     Pharynx: Oropharynx is clear. No oropharyngeal exudate.  Eyes:     General: No scleral icterus.       Right eye: No discharge.        Left eye: No discharge.     Extraocular Movements: Extraocular movements intact.     Conjunctiva/sclera: Conjunctivae normal.     Pupils: Pupils are equal, round, and reactive to light.  Cardiovascular:     Rate and Rhythm: Normal rate.     Pulses: Normal pulses.  Pulmonary:     Effort: Pulmonary effort is normal.     Breath sounds: No stridor. No wheezing or rhonchi.  Abdominal:     General: Abdomen is flat. There is no distension.  Musculoskeletal:     Cervical back: Neck supple.  Skin:    General: Skin is warm and dry.     Capillary Refill: Capillary refill takes less than 2 seconds.  Neurological:     General: No focal deficit present.     Mental Status: She is alert and oriented to person, place, and time. Mental status is at baseline.     Cranial Nerves: No cranial nerve deficit.     Sensory: No sensory deficit.     Motor: No weakness.     Coordination: Coordination normal.     Gait: Gait normal.     Deep Tendon Reflexes: Reflexes normal.  Psychiatric:        Mood and Affect: Mood normal.        Behavior: Behavior normal.        Thought Content: Thought content normal.        Judgment: Judgment normal.    Left knee - Skin is warm dry and intact no erythema no prior  incisions. - Tenderness is primarily along the medial joint line - Crepitance is noted on range of motion - Minimal flexion contracture range of motion 120 degrees overall flexion - All ligaments intact stable - Extensor mechanism normal in terms of strength  Vital signs in last 24 hours:    Labs:     Latest Ref Rng & Units 03/18/2024    8:15 AM 08/27/2016    9:03 AM  CBC  WBC 4.0 - 10.5 K/uL 6.0  8.0   Hemoglobin 12.0 -  15.0 g/dL 16.1  09.6   Hematocrit 36.0 - 46.0 % 39.0  43.7   Platelets 150 - 400 K/uL 220  301        Latest Ref Rng & Units 03/18/2024    8:15 AM 08/27/2016    9:03 AM  BMP  Glucose 70 - 99 mg/dL 045  409   BUN 8 - 23 mg/dL 22  13   Creatinine 8.11 - 1.00 mg/dL 9.14  7.82   Sodium 956 - 145 mmol/L 137  135   Potassium 3.5 - 5.1 mmol/L 4.0  4.2   Chloride 98 - 111 mmol/L 103  105   CO2 22 - 32 mmol/L 25  25   Calcium  8.9 - 10.3 mg/dL 9.0  8.7       Estimated body mass index is 25.84 kg/m as calculated from the following:   Height as of 03/18/24: 5\' 7"  (1.702 m).   Weight as of 03/18/24: 74.8 kg.   Imaging Review Plain radiographs demonstrate severe degenerative joint disease of the left knee(s). The overall alignment issignificant varus. The bone quality appears to be good for age and reported activity level.   Assessment/Plan:  End stage arthritis, left knee   The patient history, physical examination, clinical judgment of the provider and imaging studies are consistent with end stage degenerative joint disease of the left knee(s) and total knee arthroplasty is deemed medically necessary. The treatment options including medical management, injection therapy arthroscopy and arthroplasty were discussed at length. The risks and benefits of total knee arthroplasty were presented and reviewed. The risks due to aseptic loosening, infection, stiffness, patella tracking problems, thromboembolic complications and other imponderables were discussed. The patient  acknowledged the explanation, agreed to proceed with the plan and consent was signed. Patient is being admitted for inpatient treatment for surgery, pain control, PT, OT, prophylactic antibiotics, VTE prophylaxis, progressive ambulation and ADL's and discharge planning. The patient is planning to be discharged home with home health services

## 2024-03-25 ENCOUNTER — Ambulatory Visit (HOSPITAL_BASED_OUTPATIENT_CLINIC_OR_DEPARTMENT_OTHER): Admitting: Certified Registered"

## 2024-03-25 ENCOUNTER — Observation Stay (HOSPITAL_COMMUNITY)

## 2024-03-25 ENCOUNTER — Other Ambulatory Visit: Payer: Self-pay

## 2024-03-25 ENCOUNTER — Ambulatory Visit (HOSPITAL_COMMUNITY): Admitting: Certified Registered"

## 2024-03-25 ENCOUNTER — Encounter (HOSPITAL_COMMUNITY): Payer: Self-pay | Admitting: Orthopedic Surgery

## 2024-03-25 ENCOUNTER — Encounter (HOSPITAL_COMMUNITY)
Admission: RE | Disposition: A | Discharge status: Home / Self Care | Payer: Self-pay | Source: Home / Self Care | Attending: Orthopedic Surgery

## 2024-03-25 ENCOUNTER — Observation Stay (HOSPITAL_COMMUNITY)
Admission: RE | Admit: 2024-03-25 | Discharge: 2024-03-26 | Disposition: A | Discharge status: Home / Self Care | Attending: Orthopedic Surgery | Admitting: Orthopedic Surgery

## 2024-03-25 DIAGNOSIS — M1712 Unilateral primary osteoarthritis, left knee: Secondary | ICD-10-CM | POA: Diagnosis not present

## 2024-03-25 DIAGNOSIS — M7052 Other bursitis of knee, left knee: Secondary | ICD-10-CM

## 2024-03-25 DIAGNOSIS — I10 Essential (primary) hypertension: Secondary | ICD-10-CM | POA: Insufficient documentation

## 2024-03-25 DIAGNOSIS — R609 Edema, unspecified: Secondary | ICD-10-CM | POA: Diagnosis not present

## 2024-03-25 DIAGNOSIS — Z79899 Other long term (current) drug therapy: Secondary | ICD-10-CM | POA: Diagnosis not present

## 2024-03-25 DIAGNOSIS — Z471 Aftercare following joint replacement surgery: Secondary | ICD-10-CM | POA: Diagnosis not present

## 2024-03-25 DIAGNOSIS — Z96652 Presence of left artificial knee joint: Secondary | ICD-10-CM | POA: Diagnosis not present

## 2024-03-25 DIAGNOSIS — M25562 Pain in left knee: Secondary | ICD-10-CM | POA: Diagnosis not present

## 2024-03-25 HISTORY — PX: TOTAL KNEE ARTHROPLASTY: SHX125

## 2024-03-25 LAB — CBC
HCT: 41.1 % (ref 36.0–46.0)
Hemoglobin: 12.6 g/dL (ref 12.0–15.0)
MCH: 32.1 pg (ref 26.0–34.0)
MCHC: 30.7 g/dL (ref 30.0–36.0)
MCV: 104.6 fL — ABNORMAL HIGH (ref 80.0–100.0)
Platelets: 219 10*3/uL (ref 150–400)
RBC: 3.93 MIL/uL (ref 3.87–5.11)
RDW: 13.2 % (ref 11.5–15.5)
WBC: 8.7 10*3/uL (ref 4.0–10.5)
nRBC: 0 % (ref 0.0–0.2)

## 2024-03-25 LAB — CREATININE, SERUM
Creatinine, Ser: 0.89 mg/dL (ref 0.44–1.00)
GFR, Estimated: >60 mL/min (ref >60)

## 2024-03-25 LAB — ABO/RH: ABO/RH(D): O POS

## 2024-03-25 SURGERY — ARTHROPLASTY, KNEE, TOTAL
Anesthesia: Spinal | Site: Knee | Laterality: Left

## 2024-03-25 MED ORDER — ALUM & MAG HYDROXIDE-SIMETH 200-200-20 MG/5ML PO SUSP: 30.0000 mL | ORAL | Status: DC | PRN

## 2024-03-25 MED ORDER — OXYCODONE HCL 5 MG PO TABS
5.0000 mg | ORAL_TABLET | Freq: Once | ORAL | Status: AC
  Administered 2024-03-25: 5 mg via ORAL
  Filled 2024-03-25: qty 1

## 2024-03-25 MED ORDER — DEXAMETHASONE SODIUM PHOSPHATE 10 MG/ML IJ SOLN
INTRAMUSCULAR | Status: DC | PRN
  Administered 2024-03-25: 8 mg via INTRAVENOUS

## 2024-03-25 MED ORDER — HYDROCODONE-ACETAMINOPHEN 5-325 MG PO TABS: 1.0000 | ORAL_TABLET | ORAL | Status: DC | PRN

## 2024-03-25 MED ORDER — CHLORHEXIDINE GLUCONATE 0.12 % MT SOLN
OROMUCOSAL | Status: AC
Start: 2024-03-25 — End: 2024-03-25
  Filled 2024-03-25: qty 15

## 2024-03-25 MED ORDER — POLYVINYL ALCOHOL 1.4 % OP SOLN: 1.0000 [drp] | OPHTHALMIC | Status: DC | PRN

## 2024-03-25 MED ORDER — METOCLOPRAMIDE HCL 5 MG/ML IJ SOLN: 5.0000 mg | Freq: Three times a day (TID) | INTRAMUSCULAR | Status: DC | PRN

## 2024-03-25 MED ORDER — 0.9 % SODIUM CHLORIDE (POUR BTL) OPTIME
TOPICAL | Status: DC | PRN
  Administered 2024-03-25: 1000 mL

## 2024-03-25 MED ORDER — DOCUSATE SODIUM 100 MG PO CAPS
100.0000 mg | ORAL_CAPSULE | Freq: Two times a day (BID) | ORAL | Status: DC
  Administered 2024-03-25 – 2024-03-26 (×2): 100 mg via ORAL
  Filled 2024-03-25 (×2): qty 1

## 2024-03-25 MED ORDER — TRANEXAMIC ACID-NACL 1000-0.7 MG/100ML-% IV SOLN
1000.0000 mg | Freq: Once | INTRAVENOUS | Status: AC
  Administered 2024-03-25: 1000 mg via INTRAVENOUS
  Filled 2024-03-25: qty 100

## 2024-03-25 MED ORDER — SODIUM CHLORIDE 0.9 % IR SOLN
Status: DC | PRN
  Administered 2024-03-25: 3000 mL

## 2024-03-25 MED ORDER — PHENYLEPHRINE 80 MCG/ML (10ML) SYRINGE FOR IV PUSH (FOR BLOOD PRESSURE SUPPORT)
PREFILLED_SYRINGE | INTRAVENOUS | Status: DC | PRN
  Administered 2024-03-25: 80 ug via INTRAVENOUS

## 2024-03-25 MED ORDER — PROPOFOL 500 MG/50ML IV EMUL
INTRAVENOUS | Status: DC | PRN
  Administered 2024-03-25: 100 ug/kg/min via INTRAVENOUS

## 2024-03-25 MED ORDER — PHENYLEPHRINE HCL (PRESSORS) 10 MG/ML IV SOLN
INTRAVENOUS | Status: AC
  Filled 2024-03-25: qty 1

## 2024-03-25 MED ORDER — METHOCARBAMOL 1000 MG/10ML IJ SOLN: 500.0000 mg | Freq: Four times a day (QID) | INTRAMUSCULAR | Status: DC | PRN

## 2024-03-25 MED ORDER — TRANEXAMIC ACID-NACL 1000-0.7 MG/100ML-% IV SOLN
1000.0000 mg | INTRAVENOUS | Status: AC
  Administered 2024-03-25: 1000 mg via INTRAVENOUS
  Filled 2024-03-25: qty 100

## 2024-03-25 MED ORDER — ENOXAPARIN SODIUM 30 MG/0.3ML IJ SOSY
30.0000 mg | PREFILLED_SYRINGE | Freq: Two times a day (BID) | INTRAMUSCULAR | Status: DC
  Administered 2024-03-26: 30 mg via SUBCUTANEOUS
  Filled 2024-03-25: qty 0.3

## 2024-03-25 MED ORDER — OXYCODONE HCL 5 MG PO TABS: 5.0000 mg | ORAL_TABLET | Freq: Once | ORAL | Status: DC | PRN

## 2024-03-25 MED ORDER — LEVOTHYROXINE SODIUM 50 MCG PO TABS
50.0000 ug | ORAL_TABLET | Freq: Every day | ORAL | Status: DC
Start: 2024-03-26 — End: 2024-03-26
  Administered 2024-03-26: 50 ug via ORAL
  Filled 2024-03-25: qty 1

## 2024-03-25 MED ORDER — CEFAZOLIN SODIUM-DEXTROSE 2-4 GM/100ML-% IV SOLN
2.0000 g | Freq: Four times a day (QID) | INTRAVENOUS | Status: AC
  Administered 2024-03-25 (×2): 2 g via INTRAVENOUS
  Filled 2024-03-25 (×2): qty 100

## 2024-03-25 MED ORDER — ONDANSETRON HCL 4 MG/2ML IJ SOLN: 4.0000 mg | Freq: Four times a day (QID) | INTRAMUSCULAR | Status: DC | PRN

## 2024-03-25 MED ORDER — BUPIVACAINE IN DEXTROSE 0.75-8.25 % IT SOLN
INTRATHECAL | Status: DC | PRN
Start: 2024-03-25 — End: 2024-03-25
  Administered 2024-03-25: 1.8 mL via INTRATHECAL

## 2024-03-25 MED ORDER — PHENOL 1.4 % MT LIQD: 1.0000 | OROMUCOSAL | Status: DC | PRN

## 2024-03-25 MED ORDER — PANTOPRAZOLE SODIUM 40 MG PO TBEC
40.0000 mg | DELAYED_RELEASE_TABLET | Freq: Every day | ORAL | Status: DC
  Administered 2024-03-25 – 2024-03-26 (×2): 40 mg via ORAL
  Filled 2024-03-25 (×2): qty 1

## 2024-03-25 MED ORDER — FENTANYL CITRATE (PF) 100 MCG/2ML IJ SOLN
INTRAMUSCULAR | Status: DC | PRN
  Administered 2024-03-25 (×4): 25 ug via INTRAVENOUS

## 2024-03-25 MED ORDER — ACETAMINOPHEN 500 MG PO TABS
500.0000 mg | ORAL_TABLET | Freq: Four times a day (QID) | ORAL | Status: AC
  Administered 2024-03-25 – 2024-03-26 (×4): 500 mg via ORAL
  Filled 2024-03-25 (×4): qty 1

## 2024-03-25 MED ORDER — ACETAMINOPHEN 500 MG PO TABS
1000.0000 mg | ORAL_TABLET | Freq: Once | ORAL | Status: AC
  Administered 2024-03-25: 1000 mg via ORAL
  Filled 2024-03-25: qty 2

## 2024-03-25 MED ORDER — MIDAZOLAM HCL 2 MG/2ML IJ SOLN
INTRAMUSCULAR | Status: AC
Start: 2024-03-25 — End: ?
  Filled 2024-03-25: qty 2

## 2024-03-25 MED ORDER — ORAL CARE MOUTH RINSE: 15.0000 mL | OROMUCOSAL | Status: DC | PRN

## 2024-03-25 MED ORDER — PROPOFOL 10 MG/ML IV BOLUS
INTRAVENOUS | Status: AC
  Filled 2024-03-25: qty 20

## 2024-03-25 MED ORDER — METOCLOPRAMIDE HCL 5 MG PO TABS: 5.0000 mg | ORAL_TABLET | Freq: Three times a day (TID) | ORAL | Status: DC | PRN

## 2024-03-25 MED ORDER — POLYETHYLENE GLYCOL 3350 17 G PO PACK: 17.0000 g | PACK | Freq: Every day | ORAL | Status: DC | PRN

## 2024-03-25 MED ORDER — LACTATED RINGERS IV SOLN: INTRAVENOUS | Status: DC | PRN

## 2024-03-25 MED ORDER — ONDANSETRON HCL 4 MG PO TABS: 4.0000 mg | ORAL_TABLET | Freq: Four times a day (QID) | ORAL | Status: DC | PRN

## 2024-03-25 MED ORDER — POVIDONE-IODINE 10 % EX SWAB
2.0000 | Freq: Once | CUTANEOUS | Status: DC
Start: 2024-03-25 — End: 2024-03-25

## 2024-03-25 MED ORDER — MORPHINE SULFATE (PF) 2 MG/ML IV SOLN: 0.5000 mg | INTRAVENOUS | Status: DC | PRN

## 2024-03-25 MED ORDER — DEXAMETHASONE SODIUM PHOSPHATE 10 MG/ML IJ SOLN
10.0000 mg | Freq: Once | INTRAMUSCULAR | Status: AC
  Administered 2024-03-26: 10 mg via INTRAVENOUS
  Filled 2024-03-25: qty 1

## 2024-03-25 MED ORDER — NOREPINEPHRINE 4 MG/250ML-% IV SOLN
INTRAVENOUS | Status: AC
  Filled 2024-03-25: qty 250

## 2024-03-25 MED ORDER — FENTANYL CITRATE (PF) 100 MCG/2ML IJ SOLN
INTRAMUSCULAR | Status: AC
  Filled 2024-03-25: qty 2

## 2024-03-25 MED ORDER — MENTHOL 3 MG MT LOZG: 1.0000 | LOZENGE | OROMUCOSAL | Status: DC | PRN

## 2024-03-25 MED ORDER — BUPIVACAINE-MELOXICAM ER 200-6 MG/7ML IJ SOLN
INTRAMUSCULAR | Status: AC
  Filled 2024-03-25: qty 2

## 2024-03-25 MED ORDER — ONDANSETRON HCL 4 MG/2ML IJ SOLN: 4.0000 mg | Freq: Once | INTRAMUSCULAR | Status: DC | PRN

## 2024-03-25 MED ORDER — LOSARTAN POTASSIUM 50 MG PO TABS
100.0000 mg | ORAL_TABLET | Freq: Every day | ORAL | Status: DC
  Administered 2024-03-25 – 2024-03-26 (×2): 100 mg via ORAL
  Filled 2024-03-25 (×2): qty 2

## 2024-03-25 MED ORDER — ROPIVACAINE HCL 5 MG/ML IJ SOLN
INTRAMUSCULAR | Status: AC
Start: 2024-03-25 — End: ?
  Filled 2024-03-25: qty 30

## 2024-03-25 MED ORDER — LIDOCAINE HCL (CARDIAC) PF 100 MG/5ML IV SOSY
PREFILLED_SYRINGE | INTRAVENOUS | Status: DC | PRN
  Administered 2024-03-25: 80 mg via INTRATRACHEAL

## 2024-03-25 MED ORDER — VENLAFAXINE HCL ER 37.5 MG PO CP24
37.5000 mg | ORAL_CAPSULE | Freq: Every day | ORAL | Status: DC
  Administered 2024-03-26: 37.5 mg via ORAL
  Filled 2024-03-25 (×2): qty 1

## 2024-03-25 MED ORDER — POLYETHYL GLYCOL-PROPYL GLYCOL 0.4-0.3 % OP GEL: Freq: Two times a day (BID) | OPHTHALMIC | Status: DC | PRN

## 2024-03-25 MED ORDER — HYDROCODONE-ACETAMINOPHEN 7.5-325 MG PO TABS: 1.0000 | ORAL_TABLET | ORAL | Status: DC | PRN

## 2024-03-25 MED ORDER — ACETAMINOPHEN 325 MG PO TABS: 325.0000 mg | ORAL_TABLET | Freq: Four times a day (QID) | ORAL | Status: DC | PRN

## 2024-03-25 MED ORDER — ONDANSETRON HCL 4 MG/2ML IJ SOLN
INTRAMUSCULAR | Status: AC
  Filled 2024-03-25: qty 2

## 2024-03-25 MED ORDER — PROPOFOL 10 MG/ML IV BOLUS
INTRAVENOUS | Status: DC | PRN
  Administered 2024-03-25: 20 mg via INTRAVENOUS

## 2024-03-25 MED ORDER — ORAL CARE MOUTH RINSE: 15.0000 mL | Freq: Once | OROMUCOSAL | Status: DC

## 2024-03-25 MED ORDER — PHENYLEPHRINE HCL-NACL 20-0.9 MG/250ML-% IV SOLN
INTRAVENOUS | Status: DC | PRN
  Administered 2024-03-25: 25 ug/min via INTRAVENOUS

## 2024-03-25 MED ORDER — TRAMADOL HCL 50 MG PO TABS
50.0000 mg | ORAL_TABLET | Freq: Four times a day (QID) | ORAL | Status: DC
  Administered 2024-03-25 – 2024-03-26 (×4): 50 mg via ORAL
  Filled 2024-03-25 (×4): qty 1

## 2024-03-25 MED ORDER — KETOROLAC TROMETHAMINE 15 MG/ML IJ SOLN
7.5000 mg | Freq: Four times a day (QID) | INTRAMUSCULAR | Status: DC
  Administered 2024-03-26 (×2): 7.5 mg via INTRAVENOUS
  Filled 2024-03-25 (×4): qty 1

## 2024-03-25 MED ORDER — DEXAMETHASONE SODIUM PHOSPHATE 10 MG/ML IJ SOLN
INTRAMUSCULAR | Status: AC
  Filled 2024-03-25: qty 1

## 2024-03-25 MED ORDER — METHOCARBAMOL 500 MG PO TABS: 500.0000 mg | ORAL_TABLET | Freq: Four times a day (QID) | ORAL | Status: DC | PRN

## 2024-03-25 MED ORDER — KETOROLAC TROMETHAMINE 30 MG/ML IJ SOLN
15.0000 mg | Freq: Once | INTRAMUSCULAR | Status: AC
  Administered 2024-03-25: 15 mg via INTRAVENOUS
  Filled 2024-03-25: qty 1

## 2024-03-25 MED ORDER — ONDANSETRON HCL 4 MG/2ML IJ SOLN
INTRAMUSCULAR | Status: DC | PRN
Start: 2024-03-25 — End: 2024-03-25
  Administered 2024-03-25: 4 mg via INTRAVENOUS

## 2024-03-25 MED ORDER — CEFAZOLIN SODIUM-DEXTROSE 2-4 GM/100ML-% IV SOLN
2.0000 g | INTRAVENOUS | Status: AC
  Administered 2024-03-25: 2 g via INTRAVENOUS
  Filled 2024-03-25: qty 100

## 2024-03-25 MED ORDER — SODIUM CHLORIDE 0.9 % IV SOLN: INTRAVENOUS | Status: AC

## 2024-03-25 MED ORDER — BUPIVACAINE-MELOXICAM ER 200-6 MG/7ML IJ SOLN
INTRAMUSCULAR | Status: DC | PRN
  Administered 2024-03-25: 400 mg

## 2024-03-25 MED ORDER — LACTATED RINGERS IV SOLN: INTRAVENOUS | Status: DC

## 2024-03-25 MED ORDER — LORATADINE 10 MG PO TABS
10.0000 mg | ORAL_TABLET | Freq: Every day | ORAL | Status: DC
  Administered 2024-03-26: 10 mg via ORAL
  Filled 2024-03-25: qty 1

## 2024-03-25 MED ORDER — OXYCODONE HCL 5 MG/5ML PO SOLN: 5.0000 mg | Freq: Once | ORAL | Status: DC | PRN

## 2024-03-25 MED ORDER — ROSUVASTATIN CALCIUM 20 MG PO TABS
20.0000 mg | ORAL_TABLET | Freq: Every day | ORAL | Status: DC
  Administered 2024-03-25 – 2024-03-26 (×2): 20 mg via ORAL
  Filled 2024-03-25 (×2): qty 1

## 2024-03-25 MED ORDER — CHLORHEXIDINE GLUCONATE 0.12 % MT SOLN: 15.0000 mL | Freq: Once | OROMUCOSAL | Status: DC

## 2024-03-25 MED ORDER — FENTANYL CITRATE PF 50 MCG/ML IJ SOSY: 25.0000 ug | PREFILLED_SYRINGE | INTRAMUSCULAR | Status: DC | PRN

## 2024-03-25 MED ORDER — OMEGA-3-ACID ETHYL ESTERS 1 G PO CAPS
1.0000 g | ORAL_CAPSULE | Freq: Every day | ORAL | Status: DC
  Administered 2024-03-26: 1 g via ORAL
  Filled 2024-03-25: qty 1

## 2024-03-25 SURGICAL SUPPLY — 59 items
ATTUNE PS FEM LT SZ 4 CEM KNEE (Femur) IMPLANT
BANDAGE ESMARK 6X9 LF (GAUZE/BANDAGES/DRESSINGS) ×1 IMPLANT
BASEPLATE TIB CMT FB PCKT SZ5 (Knees) IMPLANT
BLADE SAGITTAL 25.0X1.27X90 (BLADE) ×1 IMPLANT
BLADE SAW SGTL 11.0X1.19X90.0M (BLADE) ×1 IMPLANT
CATH FOLEY 2WAY SLVR 5CC 12FR (CATHETERS) IMPLANT
CATH FOLEY 2WAY SLVR 5CC 16FR (CATHETERS) IMPLANT
CEMENT HV SMART SET (Cement) ×2 IMPLANT
CLOTH BEACON ORANGE TIMEOUT ST (SAFETY) ×1 IMPLANT
COOLER ICEMAN CLASSIC (MISCELLANEOUS) ×1 IMPLANT
COUNTER NDL MAGNETIC 40 RED (SET/KITS/TRAYS/PACK) ×1 IMPLANT
COUNTER NEEDLE MAGNETIC 40 RED (SET/KITS/TRAYS/PACK) ×1 IMPLANT
COVER LIGHT HANDLE STERIS (MISCELLANEOUS) ×2 IMPLANT
CUFF TRNQT CYL 34X4.125X (TOURNIQUET CUFF) ×1 IMPLANT
DRAPE BACK TABLE (DRAPES) ×1 IMPLANT
DRAPE EXTREMITY T 121X128X90 (DISPOSABLE) ×1 IMPLANT
DRESSING AQUACEL AG ADV 3.5X12 (MISCELLANEOUS) ×1 IMPLANT
DURAPREP 26ML APPLICATOR (WOUND CARE) ×2 IMPLANT
ELECTRODE REM PT RTRN 9FT ADLT (ELECTROSURGICAL) ×1 IMPLANT
GLOVE BIO SURGEON STRL SZ7 (GLOVE) IMPLANT
GLOVE BIOGEL PI IND STRL 7.0 (GLOVE) ×6 IMPLANT
GLOVE BIOGEL PI IND STRL 7.5 (GLOVE) IMPLANT
GLOVE BIOGEL PI IND STRL 8.5 (GLOVE) ×1 IMPLANT
GLOVE ECLIPSE 7.0 STRL STRAW (GLOVE) IMPLANT
GLOVE SKINSENSE STRL SZ8.0 LF (GLOVE) ×1 IMPLANT
GLOVE SURG SS PI 7.5 STRL IVOR (GLOVE) IMPLANT
GOWN STRL REUS W/TWL LRG LVL3 (GOWN DISPOSABLE) ×3 IMPLANT
GOWN STRL REUS W/TWL XL LVL3 (GOWN DISPOSABLE) ×1 IMPLANT
HOOD PEEL AWAY T7 (MISCELLANEOUS) ×4 IMPLANT
INSERT TIBIAL ATTUNE POST 10MM (Knees) IMPLANT
INST SET MAJOR BONE (KITS) ×1 IMPLANT
KIT TURNOVER KIT A (KITS) ×1 IMPLANT
MANIFOLD NEPTUNE II (INSTRUMENTS) ×1 IMPLANT
MARKER SKIN DUAL TIP RULER LAB (MISCELLANEOUS) ×1 IMPLANT
NS IRRIG 1000ML POUR BTL (IV SOLUTION) ×1 IMPLANT
PACK TOTAL JOINT (CUSTOM PROCEDURE TRAY) ×1 IMPLANT
PAD ARMBOARD POSITIONER FOAM (MISCELLANEOUS) ×1 IMPLANT
PAD COLD SHLDR SM WRAP-ON (PAD) ×1 IMPLANT
PATELLA MEDIAL ATTUN 35MM KNEE (Knees) IMPLANT
PENCIL SMOKE EVACUATOR (MISCELLANEOUS) IMPLANT
PILLOW KNEE EXTENSION 0 DEG (MISCELLANEOUS) ×1 IMPLANT
PIN STEINMAN FIXATION KNEE (PIN) IMPLANT
POSITIONER HEAD 8X9X4 ADT (SOFTGOODS) ×1 IMPLANT
SAW OSC TIP CART 19.5X105X1.3 (SAW) ×1 IMPLANT
SET BASIN LINEN APH (SET/KITS/TRAYS/PACK) ×1 IMPLANT
SET HNDPC FAN SPRY TIP SCT (DISPOSABLE) ×1 IMPLANT
SOL .9 NS 3000ML IRR UROMATIC (IV SOLUTION) ×1 IMPLANT
SOLUTION IRRIG SURGIPHOR (IV SOLUTION) ×1 IMPLANT
STAPLER SKIN PROX WIDE 3.9 (STAPLE) ×1 IMPLANT
SUT BRALON NAB BRD #1 30IN (SUTURE) ×1 IMPLANT
SUT MNCRL 0 VIOLET CTX 36 (SUTURE) ×1 IMPLANT
SUT MON AB 0 CT1 (SUTURE) ×1 IMPLANT
SYR BULB IRRIG 60ML STRL (SYRINGE) ×1 IMPLANT
TOWEL OR 17X26 4PK STRL BLUE (TOWEL DISPOSABLE) ×1 IMPLANT
TOWER CARTRIDGE SMART MIX (DISPOSABLE) ×1 IMPLANT
TRAY FOLEY MTR SLVR 16FR STAT (SET/KITS/TRAYS/PACK) ×1 IMPLANT
TUBE CONNECTING 12X1/4 (SUCTIONS) IMPLANT
WATER STERILE IRR 1000ML POUR (IV SOLUTION) ×2 IMPLANT
YANKAUER SUCT 12FT TUBE ARGYLE (SUCTIONS) ×1 IMPLANT

## 2024-03-25 NOTE — Anesthesia Preprocedure Evaluation (Signed)
 Anesthesia Evaluation  Patient identified by MRN, date of birth, ID band Patient awake    Reviewed: Allergy & Precautions, H&P , NPO status , Patient's Chart, lab work & pertinent test results, reviewed documented beta blocker date and time   Airway Mallampati: II  TM Distance: >3 FB Neck ROM: full    Dental no notable dental hx.    Pulmonary neg pulmonary ROS   Pulmonary exam normal breath sounds clear to auscultation       Cardiovascular Exercise Tolerance: Good hypertension, negative cardio ROS  Rhythm:regular Rate:Normal     Neuro/Psych  Headaches  Anxiety     negative neurological ROS  negative psych ROS   GI/Hepatic negative GI ROS, Neg liver ROS,,,  Endo/Other  negative endocrine ROS    Renal/GU negative Renal ROS  negative genitourinary   Musculoskeletal   Abdominal   Peds  Hematology negative hematology ROS (+)   Anesthesia Other Findings   Reproductive/Obstetrics negative OB ROS                             Anesthesia Physical Anesthesia Plan  ASA: 2  Anesthesia Plan: Spinal   Post-op Pain Management: Regional block*   Induction:   PONV Risk Score and Plan: Propofol  infusion  Airway Management Planned:   Additional Equipment:   Intra-op Plan:   Post-operative Plan:   Informed Consent: I have reviewed the patients History and Physical, chart, labs and discussed the procedure including the risks, benefits and alternatives for the proposed anesthesia with the patient or authorized representative who has indicated his/her understanding and acceptance.     Dental Advisory Given  Plan Discussed with: CRNA  Anesthesia Plan Comments:        Anesthesia Quick Evaluation

## 2024-03-25 NOTE — Transfer of Care (Signed)
 Immediate Anesthesia Transfer of Care Note  Patient: Diana Skinner  Procedure(s) Performed: ARTHROPLASTY, KNEE, TOTAL (Left: Knee)  Patient Location: PACU  Anesthesia Type:MAC combined with regional for post-op pain  Level of Consciousness: awake and alert   Airway & Oxygen Therapy: Patient Spontanous Breathing and Patient connected to nasal cannula oxygen  Post-op Assessment: Report given to RN and Post -op Vital signs reviewed and stable  Post vital signs: Reviewed and stable  Last Vitals:  Vitals Value Taken Time  BP 109/69 03/25/24 1000  Temp    Pulse 82 03/25/24 1002  Resp 15 03/25/24 1002  SpO2 93 % 03/25/24 1002  Vitals shown include unfiled device data.  Last Pain:  Vitals:   03/25/24 0637  TempSrc:   PainSc: 0-No pain         Complications: No notable events documented.

## 2024-03-25 NOTE — TOC CM/SW Note (Signed)
 Transition of Care [facility]) - Inpatient Brief Assessment   Patient Details  Name: Diana Skinner MRN: [id number] Date of Birth: 08-Apr-1951  Transition of Care [facility]) CM/SW Contact:    Cyndie Dredge, LCSWA Phone Number: 03/25/2024, 1:24 PM   Clinical Narrative:  Thurston Flow from [facility] sent a message yesterday to writer about following patient and providing Home Health service after surgery. Dr. Phyllis Breeze office will arrange [facility] orders and any recommended DME after surgery. TOC signing off.    Transition of Care Asessment: Insurance and Status: Insurance coverage has been reviewed Patient has primary care physician: Yes Home environment has been reviewed: Single Family Home with spouse Prior level of function:: Independent Prior/Current Home Services: No current home services Social Drivers of Health Review: SDOH reviewed no interventions necessary Readmission risk has been reviewed: Yes Transition of care needs: no transition of care needs at this time

## 2024-03-25 NOTE — Anesthesia Procedure Notes (Signed)
 Spinal  Patient location during procedure: OR Start time: 03/25/2024 8:00 AM Reason for block: surgical anesthesia Staffing Resident/CRNA: Hunter Maha, CRNA Performed by: Hunter Maha, CRNA Authorized by: Coretha Dew, MD   Preanesthetic Checklist Completed: patient identified, IV checked, site marked, risks and benefits discussed, surgical consent, monitors and equipment checked, pre-op evaluation and timeout performed Spinal Block Patient position: sitting Prep: DuraPrep Patient monitoring: heart rate, cardiac monitor, continuous pulse ox and blood pressure Approach: midline Location: L4-5 Injection technique: single-shot Needle Needle type: Sprotte and Pencan  Needle gauge: 24 G Needle length: 9 cm Assessment Sensory level: T10 Events: CSF return

## 2024-03-25 NOTE — Plan of Care (Signed)

## 2024-03-25 NOTE — Brief Op Note (Signed)
 03/25/2024  10:05 AM  PATIENT:  Jens Molder  73 y.o. female  PRE-OPERATIVE DIAGNOSIS:  left total knee arthroplasty  POST-OPERATIVE DIAGNOSIS:  left total knee arthroplasty  PROCEDURE:  Procedure(s): ARTHROPLASTY, KNEE, TOTAL (Left)  SURGEON:  Surgeons and Role:    Darrin Emerald, MD - Primary  PHYSICIAN ASSISTANT:   ASSISTANTS: sunny smith and harley cox    ANESTHESIA:   none  EBL:  50 mL   BLOOD ADMINISTERED:none  DRAINS: none   LOCAL MEDICATIONS USED:  OTHER zinrelef  SPECIMEN:  No Specimen  DISPOSITION OF SPECIMEN:  N/A  COUNTS:  YES  TOURNIQUET:   Total Tourniquet Time Documented: Thigh (Left) - 86 minutes Total: Thigh (Left) - 86 minutes   DICTATION: .Dotti Gear Dictation  PLAN OF CARE: Admit for overnight observation  PATIENT DISPOSITION:  PACU - hemodynamically stable.   Delay start of Pharmacological VTE agent (>24hrs) due to surgical blood loss or risk of bleeding: yes

## 2024-03-25 NOTE — Op Note (Signed)
 Dictation for total knee replacement  Orthopaedic Surgery Operative Note (CSN: [id number])  Diana Skinner  [birth year] Date of Surgery: 03/25/2024    03/25/2024  TXA USED - YES   10:05 AM  PATIENT:  Diana Skinner  73 y.o. female  PRE-OPERATIVE DIAGNOSIS:  left total knee arthroplasty  POST-OPERATIVE DIAGNOSIS:  left total knee arthroplasty  PROCEDURE:  Procedure(s): ARTHROPLASTY, KNEE, TOTAL (Left)  SURGEON:  Surgeons and Role:    Darrin Emerald, MD - Primary  PHYSICIAN ASSISTANT:   ASSISTANTS: sunny smith and harley cox    ANESTHESIA:   none  EBL:  50 mL   BLOOD ADMINISTERED:none  DRAINS: none   LOCAL MEDICATIONS USED:  OTHER zinrelef  SPECIMEN:  No Specimen  DISPOSITION OF SPECIMEN:  N/A  COUNTS:  YES  TOURNIQUET:   Total Tourniquet Time Documented: Thigh (Left) - 86 minutes Total: Thigh (Left) - 86 minutes   DICTATION: .Dotti Gear Dictation  PLAN OF CARE: Admit for overnight observation  PATIENT DISPOSITION:  PACU - hemodynamically stable.   Delay start of Pharmacological VTE agent (>24hrs) due to surgical blood loss or risk of bleeding: yes    Implants: Implant Name Type Inv. Item Serial No. Manufacturer Lot No. LRB No. Used Action  CEMENT HV SMART SET - [id number] Cement CEMENT HV SMART SET  [employer] [id number] Left 2 Implanted  INSERT TIBIAL ATTUNE POST - [id number] Knees INSERT TIBIAL ATTUNE POST  [employer] [id number] Left 1 Implanted  PATELLA MEDIAL ATTUN KNEE - [id number] Knees PATELLA MEDIAL ATTUN KNEE  [employer]PU[employer] [id number] Left 1 Implanted  ATTUNE PS FEM LT SZ 4 CEM KNEE - [id number] Femur ATTUNE PS FEM LT SZ 4 CEM KNEE  [employer]PU[employer] [id number] Left 1 Implanted  BASEPLATE TIB CMT FB PCKT SZ5 - [id number] Knees BASEPLATE TIB CMT FB PCKT SZ5  [employer] [id number] Left 1 Implanted     Indications for Surgery:   Disabling pain of the  LEFT  knee which did not improve despite  nonoperative measures. The benefits and risks of operative and nonoperative management were discussed prior to surgery with patient/guardian(s) and informed consent form was completed.  While all risks cannot be anticipated, specific risks including infection, need for additional surgery, stiffness, postop pain, infection, implant removal, loosening, infection requiring amputation, deep vein thrombosis, pulmonary embolus were discussed.  Diana Skinner was seen in the preop area and cleared for surgery.  Chart review was completed and consent was signed.  Images were reviewed  The patient was taken to the operating room   A spinal anesthetic was completed   a Foley catheter was inserted with some difficulty.  Patient's anatomy was altered and the urethra was hard to find initially.  A tourniquet was placed on the left proximal thigh and the left leg was prepped and draped sterilely from groin to toes  The limb was exsanguinated with a 6 inch Esmarch the tourniquet was elevated to 280 mmHg  We used a midline incision and a median parapatellar approach  The medial soft tissue sleeve was elevated to the mid coronal plane including release of the deep medial collateral ligament.  The medial meniscus was intact and was removed  The patella fat pad was excised the lateral meniscus was removed  The ACL and PCL were resected  Osteophytes were removed from the notch and the lateral femoral condyle and the patella  The tibia was subluxated forward and the posterior horns of the menisci were excised as  well.  There was excessive wear of the medial compartment.  There was no cartilage left on the medial femoral condyle or tibial plateau.  The bone had a marble appearance.  The lateral compartment was relatively preserved the patella had grade 2 chondral changes consistent with arthritis  3 things drill bit was used into the femoral canal which was then suctioned and irrigated to remove fat contents.  The  femoral guide was set for 5 degree left knee with 9 mm resection.  9 mm of bone was resected and this was measured.  The external tibial alignment guide was then placed.  Reference points were the ACL insertion medial third of the tibial tubercle midline of the tibial plateau  We removed cartilage from the lateral plateau and then set the stylus for 9 mm resection from the lateral side which was the higher side.  The measured resection was 12 mm  Once this was completed the tibia was measured to a 5 baseplate.  Extension gap was then checked settling on a 10 mm block.  This gave full extension with good collateral ligament balance.  Femoral sizing was performed with a 3 degree external rotation built into match the epicondyles using Whitesides line and the transepicondylar axis.  The femur was sized to a size 4.  The 4 distal femoral cuts were performed.  This was performed with the femoral block moved down 1-1/2 mm and prior to cutting the use and angle waiting to check for notching.  Once this was completed the extension and flexion gaps were then rechecked in balance with a 10 mm block.  We used the external rotation test with the knee at 90 degrees and the anterior drawer test to check for stability.  We then cut the notch for the femur and did a trial reduction with a 4 femur 5 tibia and a 10 polyethylene insert.  The patient was able to obtain full extensio and 130 degrees of flexion with no condylar lift off of the tibia.  The tibia was then punched and several drill holes were made in the proximal tibia to assist with cement interdigitation  The patella measured 25 mm in thickness we used the patellar cutting gun with a 9.5 mm resection leaving 15.5 mm of patella left we used a 35 button  The bone was irrigated and dried in preparation for cement.  2 bags of cement were mixed on the back table and brought forward and the implants were covered with cement and cemented in place excess cement  was removed  A 10 mm insert was placed and a final range of motion check was performed This was satisfactory and the wound was then irrigated with surgIPHOR followed by saline irrigation   The extensor mechanism was closed with #1 Bralon suture.  A small opening was left open to in circles and relief and then closure with #1 Braylon, followed by subcutaneous tissue closure using 0 Monocryl suture   Skin approximation was performed using staples  A sterile dressing was applied, TED hose were placed on the operative extremity followed by Cryo/Cuff.  The patient was taken recovery room in stable condition  Postop plan: Weightbearing as tolerated CPM machine Immediate physical therapy Discharge tomorrow if stable

## 2024-03-25 NOTE — Interval H&P Note (Signed)
 History and Physical Interval Note:  03/25/2024 7:19 AM  Diana Skinner  has presented today for surgery, with the diagnosis of left total knee arthroplasty.  The various methods of treatment have been discussed with the patient and family. After consideration of risks, benefits and other options for treatment, the patient has consented to  Procedure(s): ARTHROPLASTY, KNEE, TOTAL (Left) as a surgical intervention.  The patient's history has been reviewed, patient examined, no change in status, stable for surgery.  I have reviewed the patient's chart and labs.  Questions were answered to the patient's satisfaction.     Elsa Halls

## 2024-03-25 NOTE — Plan of Care (Signed)
  Problem: Acute Rehab PT Goals(only PT should resolve) Goal: Pt Will Go Supine/Side To Sit Outcome: Progressing Flowsheets (Taken 03/25/2024 1545) Pt will go Supine/Side to Sit:  Independently  with modified independence Goal: Patient Will Transfer Sit To/From Stand Outcome: Progressing Flowsheets (Taken 03/25/2024 1545) Patient will transfer sit to/from stand:  with modified independence  Independently Goal: Pt Will Transfer Bed To Chair/Chair To Bed Outcome: Progressing Flowsheets (Taken 03/25/2024 1545) Pt will Transfer Bed to Chair/Chair to Bed:  with modified independence  Independently Goal: Pt Will Ambulate Outcome: Progressing Flowsheets (Taken 03/25/2024 1545) Pt will Ambulate:  > 125 feet  with modified independence  with rolling walker   3:45 PM, 03/25/24 Walton Guppy, MPT Physical Therapist with [facility] [phone] office [phone] mobile phone

## 2024-03-25 NOTE — Care Management Obs Status (Signed)
 MEDICARE OBSERVATION STATUS NOTIFICATION   Patient Details  Name: Diana Skinner MRN: [id number] Date of Birth: 1951-04-07   Medicare Observation Status Notification Given:  Yes    Geraldina Klinefelter, RN 03/25/2024, 7:33 PM

## 2024-03-25 NOTE — Evaluation (Signed)
 Physical Therapy Evaluation Patient Details Name: Diana Skinner MRN: [id number] DOB: 06-Sep-1951 Today's Date: 03/25/2024   LEFT KNEE ROM: 0 - 105 degrees AMBULATION DISTANCE: 85 feet using RW with Modified Independence/Supervision   History of Present Illness  Diana Skinner is 73 y/o female, s/p Left TKA on 03/25/24, with the diagnosis of left total knee arthroplasty.  Clinical Impression  Patient demonstrates good return for completing left knee post op exercises, heel to toe stepping during gait training without loss of balance and tolerated up to 105 degrees left knee flexion self stretching seated at bedside. Patient tolerated sitting up in chair after therapy with left knee dangling. Patient will benefit from continued skilled physical therapy in hospital and recommended venue below to increase strength, balance, endurance for safe ADLs and gait.          If plan is discharge home, recommend the following: A little help with walking and/or transfers;A little help with bathing/dressing/bathroom;Help with stairs or ramp for entrance;Assistance with cooking/housework   Can travel by private vehicle        Equipment Recommendations None recommended by PT  Recommendations for Other Services       Functional Status Assessment Patient has had a recent decline in their functional status and demonstrates the ability to make significant improvements in function in a reasonable and predictable amount of time.     Precautions / Restrictions Precautions Precautions: Fall Restrictions Weight Bearing Restrictions Per Provider Order: Yes LLE Weight Bearing Per Provider Order: Weight bearing as tolerated      Mobility  Bed Mobility Overal bed mobility: Modified Independent                  Transfers Overall transfer level: Needs assistance Equipment used: Rolling walker (2 wheels) Transfers: Sit to/from Stand, Bed to chair/wheelchair/BSC Sit to Stand: Modified  independent (Device/Increase time), Supervision   Step pivot transfers: Supervision, Modified independent (Device/Increase time)       General transfer comment: demonstrates good return for sit to stands, transfers    Ambulation/Gait Ambulation/Gait assistance: Supervision, Modified independent (Device/Increase time) Gait Distance (Feet): 85 Feet Assistive device: Rolling walker (2 wheels) Gait Pattern/deviations: Decreased step length - right, Decreased step length - left, Decreased stride length, Antalgic Gait velocity: decreased     General Gait Details: slow slightly labored movement with fair/good return for left heel to toe stepping without loss of balance  Stairs            Wheelchair Mobility     Tilt Bed    Modified Rankin (Stroke Patients Only)       Balance Overall balance assessment: Needs assistance Sitting-balance support: Feet supported, No upper extremity supported Sitting balance-Leahy Scale: Good Sitting balance - Comments: seated at EOB   Standing balance support: During functional activity, Bilateral upper extremity supported Standing balance-Leahy Scale: Fair Standing balance comment: fair/good using RW                             Pertinent Vitals/Pain Pain Assessment Pain Assessment: 0-10 Pain Score: 1  Pain Location: left knee Pain Descriptors / Indicators: Discomfort Pain Intervention(s): Limited activity within patient's tolerance, Monitored during session, Repositioned    Home Living Family/patient expects to be discharged to:: Private residence Living Arrangements: Spouse/significant other Available Help at Discharge: Family;Available 24 hours/day Type of Home: House Home Access: Stairs to enter Entrance Stairs-Rails: Right;Left (to wide to reach both) Entrance Stairs-Number of Steps:  5   Home Layout: One level Home Equipment: Agricultural consultant (2 wheels);Cane - single point;Grab bars - tub/shower;BSC/3in1;Shower  seat      Prior Function Prior Level of Function : Independent/Modified Independent;Driving             Mobility Comments: Community ambulation without AD, drives, shops ADLs Comments: Independent     Extremity/Trunk Assessment   Upper Extremity Assessment Upper Extremity Assessment: Overall WFL for tasks assessed    Lower Extremity Assessment Lower Extremity Assessment: Overall WFL for tasks assessed;LLE deficits/detail LLE Deficits / Details: grossly -4/5 LLE: Unable to fully assess due to pain LLE Sensation: WNL LLE Coordination: WNL    Cervical / Trunk Assessment Cervical / Trunk Assessment: Normal  Communication   Communication Communication: No apparent difficulties    Cognition   Behavior During Therapy: WFL for tasks assessed/performed   PT - Cognitive impairments: No apparent impairments                         Following commands: Intact       Cueing Cueing Techniques: Verbal cues     General Comments      Exercises Total Joint Exercises Ankle Circles/Pumps: Supine, 10 reps, Left, Strengthening, AROM Quad Sets: AROM, Strengthening, Left, 10 reps, Supine Short Arc Quad: AROM, Strengthening, Left, 10 reps, Supine Heel Slides: AROM, Strengthening, Left, 10 reps, Supine Goniometric ROM: Left knee: 0 - 105 degrees   Assessment/Plan    PT Assessment Patient needs continued PT services  PT Problem List Decreased strength;Decreased range of motion;Decreased activity tolerance;Decreased balance;Decreased mobility       PT Treatment Interventions DME instruction;Gait training;Stair training;Functional mobility training;Therapeutic activities;Therapeutic exercise;Balance training;Patient/family education    PT Goals (Current goals can be found in the Care Plan section)  Acute Rehab PT Goals Patient Stated Goal: return home with family to assist PT Goal Formulation: With patient Time For Goal Achievement: 03/27/24 Potential to Achieve  Goals: Good    Frequency BID     Co-evaluation               AM-PAC PT "6 Clicks" Mobility  Outcome Measure Help needed turning from your back to your side while in a flat bed without using bedrails?: None Help needed moving from lying on your back to sitting on the side of a flat bed without using bedrails?: None Help needed moving to and from a bed to a chair (including a wheelchair)?: A Little Help needed standing up from a chair using your arms (e.g., wheelchair or bedside chair)?: None Help needed to walk in hospital room?: A Little Help needed climbing 3-5 steps with a railing? : A Little 6 Click Score: 21    End of Session   Activity Tolerance: Patient tolerated treatment well;Patient limited by fatigue Patient left: in chair;with call bell/phone within reach Nurse Communication: Mobility status PT Visit Diagnosis: Unsteadiness on feet (R26.81);Other abnormalities of gait and mobility (R26.89);Muscle weakness (generalized) (M62.81)    Time: 1610-9604 PT Time Calculation (min) (ACUTE ONLY): 33 min   Charges:   PT Evaluation $PT Eval Moderate Complexity: 1 Mod PT Treatments $Therapeutic Activity: 23-37 mins PT General Charges $$ ACUTE PT VISIT: 1 Visit         3:43 PM, 03/25/24 Walton Guppy, MPT Physical Therapist with [facility] [phone] office [phone] mobile phone

## 2024-03-25 NOTE — Interval H&P Note (Signed)
 History and Physical Interval Note:  03/25/2024 7:23 AM  Jens Molder  has presented today for surgery, with the diagnosis of left total knee arthroplasty.  The various methods of treatment have been discussed with the patient and family. After consideration of risks, benefits and other options for treatment, the patient has consented to  Procedure(s): ARTHROPLASTY, KNEE, TOTAL (Left) as a surgical intervention.  The patient's history has been reviewed, patient examined, no change in status, stable for surgery.  I have reviewed the patient's chart and labs.  Questions were answered to the patient's satisfaction.     Elsa Halls

## 2024-03-26 ENCOUNTER — Encounter (HOSPITAL_COMMUNITY): Payer: Self-pay | Admitting: Orthopedic Surgery

## 2024-03-26 DIAGNOSIS — I10 Essential (primary) hypertension: Secondary | ICD-10-CM | POA: Diagnosis not present

## 2024-03-26 DIAGNOSIS — Z79899 Other long term (current) drug therapy: Secondary | ICD-10-CM | POA: Diagnosis not present

## 2024-03-26 DIAGNOSIS — M1712 Unilateral primary osteoarthritis, left knee: Secondary | ICD-10-CM | POA: Diagnosis not present

## 2024-03-26 LAB — CBC
HCT: 30.3 % — ABNORMAL LOW (ref 36.0–46.0)
Hemoglobin: 9.8 g/dL — ABNORMAL LOW (ref 12.0–15.0)
MCH: 32 pg (ref 26.0–34.0)
MCHC: 32.3 g/dL (ref 30.0–36.0)
MCV: 99 fL (ref 80.0–100.0)
Platelets: 205 10*3/uL (ref 150–400)
RBC: 3.06 MIL/uL — ABNORMAL LOW (ref 3.87–5.11)
RDW: 13.4 % (ref 11.5–15.5)
WBC: 11 10*3/uL — ABNORMAL HIGH (ref 4.0–10.5)
nRBC: 0 % (ref 0.0–0.2)

## 2024-03-26 LAB — BASIC METABOLIC PANEL WITH GFR
Anion gap: 7 (ref 5–15)
BUN: 20 mg/dL (ref 8–23)
CO2: 25 mmol/L (ref 22–32)
Calcium: 8.7 mg/dL — ABNORMAL LOW (ref 8.9–10.3)
Chloride: 106 mmol/L (ref 98–111)
Creatinine, Ser: 0.98 mg/dL (ref 0.44–1.00)
GFR, Estimated: >60 mL/min (ref >60)
Glucose, Bld: 137 mg/dL — ABNORMAL HIGH (ref 70–99)
Potassium: 4.2 mmol/L (ref 3.5–5.1)
Sodium: 138 mmol/L (ref 135–145)

## 2024-03-26 MED ORDER — METHOCARBAMOL 500 MG PO TABS: 500.0000 mg | ORAL_TABLET | Freq: Four times a day (QID) | ORAL | 2 refills | Status: AC | PRN

## 2024-03-26 MED ORDER — ASPIRIN 325 MG PO TBEC: 325.0000 mg | DELAYED_RELEASE_TABLET | Freq: Every day | ORAL | 3 refills | Status: AC

## 2024-03-26 MED ORDER — HYDROCODONE-ACETAMINOPHEN 10-325 MG PO TABS: 1.0000 | ORAL_TABLET | ORAL | 0 refills | Status: AC | PRN

## 2024-03-26 MED ORDER — DOCUSATE SODIUM 100 MG PO CAPS: 100.0000 mg | ORAL_CAPSULE | Freq: Two times a day (BID) | ORAL | 0 refills | Status: AC

## 2024-03-26 MED ORDER — POLYETHYLENE GLYCOL 3350 17 G PO PACK
17.0000 g | PACK | Freq: Every day | ORAL | 0 refills | Status: AC | PRN
Start: 2024-03-26 — End: ?

## 2024-03-26 MED ORDER — TRAMADOL HCL 50 MG PO TABS: 50.0000 mg | ORAL_TABLET | Freq: Four times a day (QID) | ORAL | 0 refills | Status: DC

## 2024-03-26 NOTE — Progress Notes (Signed)
 Patient has discharge orders, discharge instructions given and no further questions at this time.

## 2024-03-26 NOTE — Progress Notes (Signed)
 Physical Therapy Treatment Patient Details Name: Diana Skinner MRN: [id number] DOB: 05/04/1951 Today's Date: 03/26/2024   LEFT KNEE ROM: 0 - 115 degrees AMBULATION DISTANCE: 150 feet using RW with Modified Independence   History of Present Illness Diana Skinner is 73 y/o female, s/p Left TKA on 03/25/24, with the diagnosis of left total knee arthroplasty.    PT Comments  Patient demonstrates good return for going up/down steps in stairwell using bilateral rails without loss of balance and understanding acknowledged, good return for completing post op knee exercises and increased endurance/distance for gait training without loss of balance. Patient tolerated sitting up in chair after therapy with LLE dangling. Patient will benefit from continued skilled physical therapy in hospital and recommended venue below to increase strength, balance, endurance for safe ADLs and gait.     If plan is discharge home, recommend the following: A little help with walking and/or transfers;A little help with bathing/dressing/bathroom;Help with stairs or ramp for entrance;Assistance with cooking/housework   Can travel by private vehicle        Equipment Recommendations  None recommended by PT    Recommendations for Other Services       Precautions / Restrictions Precautions Precautions: Fall Restrictions Weight Bearing Restrictions Per Provider Order: Yes LLE Weight Bearing Per Provider Order: Weight bearing as tolerated     Mobility  Bed Mobility Overal bed mobility: Independent, Modified Independent                  Transfers Overall transfer level: Modified independent                 General transfer comment: good return for transferring from bed to chair without problem.    Ambulation/Gait Ambulation/Gait assistance: Modified independent (Device/Increase time) Gait Distance (Feet): 150 Feet Assistive device: Rolling walker (2 wheels) Gait Pattern/deviations:  Decreased step length - right, Decreased step length - left, Decreased stride length, Antalgic Gait velocity: decreased     General Gait Details: increased endurance/distance for gait training with good return for left heel to toe stepping without loss of balance   Stairs Stairs: Yes Stairs assistance: Modified independent (Device/Increase time), Supervision Stair Management: Two rails, Step to pattern Number of Stairs: 5 General stair comments: Patient demonstrates good return for going up/down steps using bilateral side rails without loss of balance and understanding acknowledged   Wheelchair Mobility     Tilt Bed    Modified Rankin (Stroke Patients Only)       Balance Overall balance assessment: Needs assistance Sitting-balance support: Feet supported, No upper extremity supported Sitting balance-Leahy Scale: Good Sitting balance - Comments: seated at EOB   Standing balance support: During functional activity, Bilateral upper extremity supported Standing balance-Leahy Scale: Good Standing balance comment: using RW                            Communication Communication Communication: No apparent difficulties  Cognition Arousal: Alert Behavior During Therapy: WFL for tasks assessed/performed   PT - Cognitive impairments: No apparent impairments                         Following commands: Intact      Cueing Cueing Techniques: Verbal cues  Exercises Total Joint Exercises Ankle Circles/Pumps: Supine, 10 reps, Left, Strengthening, AROM Quad Sets: AROM, Strengthening, Left, 10 reps, Supine Short Arc Quad: AROM, Strengthening, Left, 10 reps, Supine Heel Slides: AROM, Strengthening, Left,  10 reps, Supine Goniometric ROM: Left knee: 0 - 115 degrees    General Comments        Pertinent Vitals/Pain Pain Assessment Pain Assessment: No/denies pain    Home Living                          Prior Function            PT Goals  (current goals can now be found in the care plan section) Acute Rehab PT Goals Patient Stated Goal: return home with family to assist PT Goal Formulation: With patient Time For Goal Achievement: 03/27/24 Potential to Achieve Goals: Good Progress towards PT goals: Progressing toward goals    Frequency    BID      PT Plan      Co-evaluation              AM-PAC PT "6 Clicks" Mobility   Outcome Measure  Help needed turning from your back to your side while in a flat bed without using bedrails?: None Help needed moving from lying on your back to sitting on the side of a flat bed without using bedrails?: None Help needed moving to and from a bed to a chair (including a wheelchair)?: None Help needed standing up from a chair using your arms (e.g., wheelchair or bedside chair)?: None Help needed to walk in hospital room?: A Little Help needed climbing 3-5 steps with a railing? : A Little 6 Click Score: 22    End of Session   Activity Tolerance: Patient tolerated treatment well Patient left: in chair;with call bell/phone within reach Nurse Communication: Mobility status PT Visit Diagnosis: Unsteadiness on feet (R26.81);Other abnormalities of gait and mobility (R26.89);Muscle weakness (generalized) (M62.81)     Time: 8295-6213 PT Time Calculation (min) (ACUTE ONLY): 25 min  Charges:    $Gait Training: 8-22 mins $Therapeutic Exercise: 8-22 mins PT General Charges $$ ACUTE PT VISIT: 1 Visit                     10:09 AM, 03/26/24 Walton Guppy, MPT Physical Therapist with [facility] [phone] office [phone] mobile phone

## 2024-03-26 NOTE — Progress Notes (Signed)
 Subjective: 1 Day Post-Op Procedure(s) (LRB): ARTHROPLASTY, KNEE, TOTAL (Left) Patient reports pain as 0 on 0-10 scale.    Objective: Vital signs in last 24 hours: Temp:  [97.5 F (36.4 C)-98 F (36.7 C)] 97.6 F (36.4 C) (04/23 0406) Pulse Rate:  [61-84] 66 (04/23 0406) Resp:  [11-18] 18 (04/23 0406) BP: (102-134)/(69-114) 102/74 (04/23 0406) SpO2:  [91 %-100 %] 96 % (04/23 0406)  Intake/Output from previous day: 04/22 0701 - 04/23 0700 In: 1657.8 [P.O.:240; I.V.:1088.2; IV Piggyback:329.6] Out: 1075 [Urine:1025; Blood:50] Intake/Output this shift: No intake/output data recorded.  Recent Labs    03/25/24 1141 03/26/24 0449  HGB 12.6 9.8*   Recent Labs    03/25/24 1141 03/26/24 0449  WBC 8.7 11.0*  RBC 3.93 3.06*  HCT 41.1 30.3*  PLT 219 205   Recent Labs    03/25/24 1141 03/26/24 0449  NA  --  138  K  --  4.2  CL  --  106  CO2  --  25  BUN  --  20  CREATININE 0.89 0.98  GLUCOSE  --  137*  CALCIUM   --  8.7*   No results for input(s): "LABPT", "INR" in the last 72 hours.  Neurologically intact Neurovascular intact Sensation intact distally Intact pulses distally Dorsiflexion/Plantar flexion intact Incision: moderate drainage   Assessment/Plan: 1 Day Post-Op Procedure(s) (LRB): ARTHROPLASTY, KNEE, TOTAL (Left) Advance diet Up with therapy D/C IV fluids Discharge home with home health      Diana Skinner 03/26/2024, 8:52 AM

## 2024-03-26 NOTE — Discharge Summary (Signed)
 Physician Discharge Summary  Patient ID: Diana Skinner MRN: [id number] DOB/AGE: 71/05/1951 71 y.o.  Admit date: 03/25/2024 Discharge date: 03/26/2024  Admission Diagnoses: Left knee osteoarthritis  Discharge Diagnoses: Left knee osteoarthritis  Discharged Condition: Stable  Procedure: Left knee arthroplasty  Hospital Course:  Hospital day 1 uncomplicated total knee arthroplasty left knee -Participated in physical therapy was able to ambulate 100 feet FT with 105 range of motion  Hospital day 2 the patient continued to do well remained afebrile with stable vital signs -Advance in physical therapy tolerating the stairs with good pain control  Implant Name Type Inv. Item Serial No. Manufacturer Lot No. LRB No. Used Action  CEMENT HV SMART SET - [id number] Cement CEMENT HV SMART SET  [employer] [id number] Left 2 Implanted  INSERT TIBIAL ATTUNE POST - [id number] Knees INSERT TIBIAL ATTUNE POST  [employer] [id number] Left 1 Implanted  PATELLA MEDIAL ATTUN KNEE - [id number] Knees PATELLA MEDIAL ATTUN KNEE  [employer] [id number] Left 1 Implanted  ATTUNE PS FEM LT SZ 4 CEM KNEE - [id number] Femur ATTUNE PS FEM LT SZ 4 CEM KNEE  [employer] [id number] Left 1 Implanted  BASEPLATE TIB CMT FB PCKT SZ5 - [id number] Knees BASEPLATE TIB CMT FB PCKT SZ5  [employer] [id number] Left 1 Implanted    Lab reports:    Latest Ref Rng & Units 03/26/2024    4:49 AM 03/25/2024   11:41 AM 03/18/2024    8:15 AM  CBC  WBC 4.0 - 10.5 K/uL 11.0  8.7  6.0   Hemoglobin 12.0 - 15.0 g/dL 9.8  02.5  42.7   Hematocrit 36.0 - 46.0 % 30.3  41.1  39.0   Platelets 150 - 400 K/uL 205  219  220       Latest Ref Rng & Units 03/26/2024    4:49 AM 03/25/2024   11:41 AM 03/18/2024    8:15 AM  BMP  Glucose 70 - 99 mg/dL 062   376   BUN 8 - 23 mg/dL 20   22   Creatinine 2.83 - 1.00 mg/dL 1.51  7.61  6.07   Sodium 135 - 145 mmol/L 138   137   Potassium 3.5 - 5.1 mmol/L  4.2   4.0   Chloride 98 - 111 mmol/L 106   103   CO2 22 - 32 mmol/L 25   25   Calcium  8.9 - 10.3 mg/dL 8.7   9.0       Discharge Exam: BP 102/74   Pulse 66   Temp 97.6 F (36.4 C) (Oral)   Resp 18   Ht 5\' 7"  (1.702 m)   Wt 74.8 kg   SpO2 96%   BMI 25.84 kg/m  Physical Exam Vitals and nursing note reviewed.  Constitutional:      Appearance: Normal appearance.  HENT:     Head: Normocephalic and atraumatic.  Eyes:     General: No scleral icterus.       Right eye: No discharge.        Left eye: No discharge.     Extraocular Movements: Extraocular movements intact.     Conjunctiva/sclera: Conjunctivae normal.     Pupils: Pupils are equal, round, and reactive to light.  Cardiovascular:     Rate and Rhythm: Normal rate.     Pulses: Normal pulses.  Musculoskeletal:     Comments: Proximal two thirds of the dressing is dry distal one third is noted to have drainage  Dorsiflexion plantarflexion normal sensation normal  Calf soft supple    Skin:    General: Skin is warm and dry.     Capillary Refill: Capillary refill takes less than 2 seconds.  Neurological:     General: No focal deficit present.     Mental Status: She is alert and oriented to person, place, and time.     Gait: Gait abnormal.  Psychiatric:        Mood and Affect: Mood normal.        Behavior: Behavior normal.        Thought Content: Thought content normal.        Judgment: Judgment normal.       Disposition: Discharge disposition: 01-Home or Self Care       Discharge Instructions     Call MD / Call 911   Complete by: As directed    If you experience chest pain or shortness of breath, CALL 911 and be transported to the hospital emergency room.  If you develope a fever above 101 F, pus (white drainage) or increased drainage or redness at the wound, or calf pain, call your surgeon's office.   Constipation Prevention   Complete by: As directed    Drink plenty of fluids.  Prune juice may be  helpful.  You may use a stool softener, such as Colace (over the counter) 100 mg twice a day.  Use MiraLax  (over the counter) for constipation as needed.   Diet - low sodium heart healthy   Complete by: As directed    Discharge instructions   Complete by: As directed    CPM 6 hours a day start 0-90 continue for 2 weeks  Blue foam 30 minutes 3 times a day  Home exercises daily  Wound care.  Your bandage is waterproof.  It will turn black.  If the entire dressing turns black call the office and we will get it changed for you  TED stockings White stockings for 6 weeks   Increase activity slowly as tolerated   Complete by: As directed    Post-operative opioid taper instructions:   Complete by: As directed    POST-OPERATIVE OPIOID TAPER INSTRUCTIONS: It is important to wean off of your opioid medication as soon as possible. If you do not need pain medication after your surgery it is ok to stop day one. Opioids include: Codeine, Hydrocodone (Norco, Vicodin), Oxycodone (Percocet, oxycontin ) and hydromorphone  amongst others.  Long term and even short term use of opiods can cause: Increased pain response Dependence Constipation Depression Respiratory depression And more.  Withdrawal symptoms can include Flu like symptoms Nausea, vomiting And more Techniques to manage these symptoms Hydrate well Eat regular healthy meals Stay active Use relaxation techniques(deep breathing, meditating, yoga) Do Not substitute Alcohol  to help with tapering If you have been on opioids for less than two weeks and do not have pain than it is ok to stop all together.  Plan to wean off of opioids This plan should start within one week post op of your joint replacement. Maintain the same interval or time between taking each dose and first decrease the dose.  Cut the total daily intake of opioids by one tablet each day Next start to increase the time between doses. The last dose that should be eliminated is  the evening dose.         Allergies as of 03/26/2024       Reactions   Sulfonamide Derivatives Swelling  Medication List     TAKE these medications    aspirin  EC 325 MG tablet Take 1 tablet (325 mg total) by mouth daily.   diclofenac  75 MG EC tablet Commonly known as: VOLTAREN  TAKE ONE TABLET (75MG  TOTAL) BY MOUTH TWO TIMES DAILY   docusate sodium  100 MG capsule Commonly known as: COLACE Take 1 capsule (100 mg total) by mouth 2 (two) times daily.   Fish Oil 1200 MG Caps Take 1,200 mg by mouth daily.   HYDROcodone -acetaminophen  10-325 MG tablet Commonly known as: NORCO Take 1 tablet by mouth every 4 (four) hours as needed for up to 7 days.   levothyroxine  50 MCG tablet Commonly known as: SYNTHROID  Take 50 mcg by mouth daily before breakfast.   loratadine  10 MG tablet Commonly known as: CLARITIN  Take 10 mg by mouth daily.   losartan  100 MG tablet Commonly known as: COZAAR  Take 100 mg by mouth daily.   methocarbamol  500 MG tablet Commonly known as: ROBAXIN  Take 1 tablet (500 mg total) by mouth every 6 (six) hours as needed for muscle spasms.   polyethylene glycol 17 g packet Commonly known as: MIRALAX  / GLYCOLAX  Take 17 g by mouth daily as needed for mild constipation.   rosuvastatin  20 MG tablet Commonly known as: CRESTOR  Take 20 mg by mouth daily.   SYSTANE OP Apply 1 drop to eye 2 (two) times daily as needed (dry eyes).   traMADol  50 MG tablet Commonly known as: ULTRAM  Take 1 tablet (50 mg total) by mouth every 6 (six) hours.   venlafaxine  XR 37.5 MG 24 hr capsule Commonly known as: EFFEXOR -XR Take 37.5 mg by mouth daily with breakfast.         Signed: Elsa Halls 03/26/2024, 8:59 AM

## 2024-03-27 DIAGNOSIS — Z7982 Long term (current) use of aspirin: Secondary | ICD-10-CM | POA: Diagnosis not present

## 2024-03-27 DIAGNOSIS — M1990 Unspecified osteoarthritis, unspecified site: Secondary | ICD-10-CM | POA: Diagnosis not present

## 2024-03-27 DIAGNOSIS — G43909 Migraine, unspecified, not intractable, without status migrainosus: Secondary | ICD-10-CM | POA: Diagnosis not present

## 2024-03-27 DIAGNOSIS — Z471 Aftercare following joint replacement surgery: Secondary | ICD-10-CM | POA: Diagnosis not present

## 2024-03-27 DIAGNOSIS — Z96652 Presence of left artificial knee joint: Secondary | ICD-10-CM | POA: Diagnosis not present

## 2024-03-27 DIAGNOSIS — I10 Essential (primary) hypertension: Secondary | ICD-10-CM | POA: Diagnosis not present

## 2024-03-27 DIAGNOSIS — E7800 Pure hypercholesterolemia, unspecified: Secondary | ICD-10-CM | POA: Diagnosis not present

## 2024-03-27 DIAGNOSIS — Z791 Long term (current) use of non-steroidal anti-inflammatories (NSAID): Secondary | ICD-10-CM | POA: Diagnosis not present

## 2024-03-28 NOTE — Anesthesia Postprocedure Evaluation (Signed)
 Anesthesia Post Note  Patient: Diana Skinner  Procedure(s) Performed: ARTHROPLASTY, KNEE, TOTAL (Left: Knee)  Patient location during evaluation: Phase II Anesthesia Type: Spinal Level of consciousness: awake Pain management: pain level controlled Vital Signs Assessment: post-procedure vital signs reviewed and stable Respiratory status: spontaneous breathing and respiratory function stable Cardiovascular status: blood pressure returned to baseline and stable Postop Assessment: no headache and no apparent nausea or vomiting Anesthetic complications: no Comments: Late entry   No notable events documented.   Last Vitals:  Vitals:   03/25/24 2116 03/26/24 0406  BP: 115/82 102/74  Pulse: 70 66  Resp: 18 18  Temp: 36.7 C 36.4 C  SpO2: 91% 96%    Last Pain:  Vitals:   03/26/24 1000  TempSrc:   PainSc: 5                  Coretha Dew

## 2024-03-29 DIAGNOSIS — Z471 Aftercare following joint replacement surgery: Secondary | ICD-10-CM | POA: Diagnosis not present

## 2024-03-29 DIAGNOSIS — G43909 Migraine, unspecified, not intractable, without status migrainosus: Secondary | ICD-10-CM | POA: Diagnosis not present

## 2024-03-29 DIAGNOSIS — Z96652 Presence of left artificial knee joint: Secondary | ICD-10-CM | POA: Diagnosis not present

## 2024-03-29 DIAGNOSIS — Z791 Long term (current) use of non-steroidal anti-inflammatories (NSAID): Secondary | ICD-10-CM | POA: Diagnosis not present

## 2024-03-29 DIAGNOSIS — Z7982 Long term (current) use of aspirin: Secondary | ICD-10-CM | POA: Diagnosis not present

## 2024-03-29 DIAGNOSIS — I10 Essential (primary) hypertension: Secondary | ICD-10-CM | POA: Diagnosis not present

## 2024-03-29 DIAGNOSIS — E7800 Pure hypercholesterolemia, unspecified: Secondary | ICD-10-CM | POA: Diagnosis not present

## 2024-03-29 DIAGNOSIS — M1990 Unspecified osteoarthritis, unspecified site: Secondary | ICD-10-CM | POA: Diagnosis not present

## 2024-03-31 DIAGNOSIS — E7800 Pure hypercholesterolemia, unspecified: Secondary | ICD-10-CM | POA: Diagnosis not present

## 2024-03-31 DIAGNOSIS — Z96652 Presence of left artificial knee joint: Secondary | ICD-10-CM | POA: Diagnosis not present

## 2024-03-31 DIAGNOSIS — Z7982 Long term (current) use of aspirin: Secondary | ICD-10-CM | POA: Diagnosis not present

## 2024-03-31 DIAGNOSIS — G43909 Migraine, unspecified, not intractable, without status migrainosus: Secondary | ICD-10-CM | POA: Diagnosis not present

## 2024-03-31 DIAGNOSIS — M1990 Unspecified osteoarthritis, unspecified site: Secondary | ICD-10-CM | POA: Diagnosis not present

## 2024-03-31 DIAGNOSIS — Z791 Long term (current) use of non-steroidal anti-inflammatories (NSAID): Secondary | ICD-10-CM | POA: Diagnosis not present

## 2024-03-31 DIAGNOSIS — Z471 Aftercare following joint replacement surgery: Secondary | ICD-10-CM | POA: Diagnosis not present

## 2024-03-31 DIAGNOSIS — I10 Essential (primary) hypertension: Secondary | ICD-10-CM | POA: Diagnosis not present

## 2024-04-01 ENCOUNTER — Telehealth: Payer: Self-pay | Admitting: Orthopedic Surgery

## 2024-04-01 NOTE — Telephone Encounter (Signed)
 I called her  We need to see her Thursday if its bleeding through, it is normal to see blood on the bandage, but not if its leaking out  Her mailbox is full

## 2024-04-01 NOTE — Telephone Encounter (Signed)
 Dr. Delfino Fellers pt - spoke w/the pt, she had surgery 4/22 on her left knee.  She stated that the last couple of nights she has had some swelling and she notice more blood inside the pocket.  She wants to know if this is normal.  Also, I need to schedule her a postop appt 14 days out, that falls on Tuesday, 5/06, better to bring in a day early or 2 days late?

## 2024-04-01 NOTE — Telephone Encounter (Signed)
 Not soaked through, if it does she is aware to call and I can work her in for a bandage change with Dr Phyllis Breeze

## 2024-04-02 DIAGNOSIS — Z791 Long term (current) use of non-steroidal anti-inflammatories (NSAID): Secondary | ICD-10-CM | POA: Diagnosis not present

## 2024-04-02 DIAGNOSIS — E7800 Pure hypercholesterolemia, unspecified: Secondary | ICD-10-CM | POA: Diagnosis not present

## 2024-04-02 DIAGNOSIS — G43909 Migraine, unspecified, not intractable, without status migrainosus: Secondary | ICD-10-CM | POA: Diagnosis not present

## 2024-04-02 DIAGNOSIS — Z96652 Presence of left artificial knee joint: Secondary | ICD-10-CM | POA: Diagnosis not present

## 2024-04-02 DIAGNOSIS — I10 Essential (primary) hypertension: Secondary | ICD-10-CM | POA: Diagnosis not present

## 2024-04-02 DIAGNOSIS — M1990 Unspecified osteoarthritis, unspecified site: Secondary | ICD-10-CM | POA: Diagnosis not present

## 2024-04-02 DIAGNOSIS — Z471 Aftercare following joint replacement surgery: Secondary | ICD-10-CM | POA: Diagnosis not present

## 2024-04-02 DIAGNOSIS — Z7982 Long term (current) use of aspirin: Secondary | ICD-10-CM | POA: Diagnosis not present

## 2024-04-02 LAB — TYPE AND SCREEN
ABO/RH(D): O POS
Antibody Screen: NEGATIVE
Unit division: 0
Unit division: 0

## 2024-04-02 LAB — BPAM RBC
Blood Product Expiration Date: 202505062359
Blood Product Expiration Date: 202505062359
Unit Type and Rh: 5100
Unit Type and Rh: 5100

## 2024-04-03 DIAGNOSIS — Z96652 Presence of left artificial knee joint: Secondary | ICD-10-CM | POA: Diagnosis not present

## 2024-04-03 DIAGNOSIS — G43909 Migraine, unspecified, not intractable, without status migrainosus: Secondary | ICD-10-CM | POA: Diagnosis not present

## 2024-04-03 DIAGNOSIS — Z471 Aftercare following joint replacement surgery: Secondary | ICD-10-CM | POA: Diagnosis not present

## 2024-04-03 DIAGNOSIS — I10 Essential (primary) hypertension: Secondary | ICD-10-CM | POA: Diagnosis not present

## 2024-04-03 DIAGNOSIS — E7800 Pure hypercholesterolemia, unspecified: Secondary | ICD-10-CM | POA: Diagnosis not present

## 2024-04-03 DIAGNOSIS — Z791 Long term (current) use of non-steroidal anti-inflammatories (NSAID): Secondary | ICD-10-CM | POA: Diagnosis not present

## 2024-04-03 DIAGNOSIS — M1990 Unspecified osteoarthritis, unspecified site: Secondary | ICD-10-CM | POA: Diagnosis not present

## 2024-04-03 DIAGNOSIS — Z7982 Long term (current) use of aspirin: Secondary | ICD-10-CM | POA: Diagnosis not present

## 2024-04-04 ENCOUNTER — Ambulatory Visit (INDEPENDENT_AMBULATORY_CARE_PROVIDER_SITE_OTHER): Admitting: Orthopedic Surgery

## 2024-04-04 DIAGNOSIS — Z96652 Presence of left artificial knee joint: Secondary | ICD-10-CM

## 2024-04-04 DIAGNOSIS — M1712 Unilateral primary osteoarthritis, left knee: Secondary | ICD-10-CM

## 2024-04-04 DIAGNOSIS — Z471 Aftercare following joint replacement surgery: Secondary | ICD-10-CM

## 2024-04-04 NOTE — Progress Notes (Signed)
 Postop visit unscheduled  Concerned about dressing  Patient has bleeding on the dressing we removed it there was no visible bleeding  She is doing well with therapy walking independently  Knee is nice and straight flexion is about 95 on the CPM  Pain medicine tramadol   DVT prophylaxis aspirin   Recommend no CPM this weekend to cut down on the bleeding Continue aspirin  Knee-high TED hose Come in next week to get staples taken out

## 2024-04-04 NOTE — Progress Notes (Signed)
   There were no vitals taken for this visit.  There is no height or weight on file to calculate BMI.  Chief Complaint  Patient presents with   Follow-up    Recheck on left knee, DOS 03/25/24.    No diagnosis found.  DOI/DOS/ Date: 03/25/24  Improved

## 2024-04-07 ENCOUNTER — Encounter: Admitting: Orthopedic Surgery

## 2024-04-07 DIAGNOSIS — Z96652 Presence of left artificial knee joint: Secondary | ICD-10-CM | POA: Diagnosis not present

## 2024-04-07 DIAGNOSIS — I10 Essential (primary) hypertension: Secondary | ICD-10-CM | POA: Diagnosis not present

## 2024-04-07 DIAGNOSIS — G43909 Migraine, unspecified, not intractable, without status migrainosus: Secondary | ICD-10-CM | POA: Diagnosis not present

## 2024-04-07 DIAGNOSIS — M1990 Unspecified osteoarthritis, unspecified site: Secondary | ICD-10-CM | POA: Diagnosis not present

## 2024-04-07 DIAGNOSIS — Z7982 Long term (current) use of aspirin: Secondary | ICD-10-CM | POA: Diagnosis not present

## 2024-04-07 DIAGNOSIS — Z471 Aftercare following joint replacement surgery: Secondary | ICD-10-CM | POA: Diagnosis not present

## 2024-04-07 DIAGNOSIS — Z791 Long term (current) use of non-steroidal anti-inflammatories (NSAID): Secondary | ICD-10-CM | POA: Diagnosis not present

## 2024-04-07 DIAGNOSIS — E7800 Pure hypercholesterolemia, unspecified: Secondary | ICD-10-CM | POA: Diagnosis not present

## 2024-04-08 ENCOUNTER — Ambulatory Visit (HOSPITAL_COMMUNITY): Attending: Orthopedic Surgery

## 2024-04-08 ENCOUNTER — Other Ambulatory Visit: Payer: Self-pay

## 2024-04-08 ENCOUNTER — Encounter (HOSPITAL_COMMUNITY): Payer: Self-pay

## 2024-04-08 DIAGNOSIS — Z7409 Other reduced mobility: Secondary | ICD-10-CM | POA: Diagnosis not present

## 2024-04-08 DIAGNOSIS — R29898 Other symptoms and signs involving the musculoskeletal system: Secondary | ICD-10-CM | POA: Diagnosis not present

## 2024-04-08 DIAGNOSIS — Z01818 Encounter for other preprocedural examination: Secondary | ICD-10-CM | POA: Diagnosis not present

## 2024-04-08 DIAGNOSIS — M25662 Stiffness of left knee, not elsewhere classified: Secondary | ICD-10-CM | POA: Diagnosis not present

## 2024-04-08 DIAGNOSIS — M170 Bilateral primary osteoarthritis of knee: Secondary | ICD-10-CM | POA: Diagnosis not present

## 2024-04-08 DIAGNOSIS — Z96652 Presence of left artificial knee joint: Secondary | ICD-10-CM | POA: Insufficient documentation

## 2024-04-08 NOTE — Therapy (Signed)
 OUTPATIENT PHYSICAL THERAPY LOWER EXTREMITY EVALUATION   Patient Name: Diana Skinner MRN: [id number] DOB:[birth year], 73 y.o., female Today's Date: 04/08/2024  END OF SESSION:  PT End of Session - 04/08/24 1629     Visit Number 1    Date for PT Re-Evaluation 04/29/24    Authorization Type [employer] MEDICARE    Authorization Time Period seeking auth    Progress Note Due on Visit 10    PT Start Time 1301    PT Stop Time 1342    PT Time Calculation (min) 41 min    Activity Tolerance Patient tolerated treatment well;Patient limited by pain    Behavior During Therapy [facility] for tasks assessed/performed             Past Medical History:  Diagnosis Date   Anxiety    Arthritis    High cholesterol    HTN (hypertension)    Migraines    Past Surgical History:  Procedure Laterality Date   CATARACT EXTRACTION W/PHACO Right 01/27/2019   Procedure: CATARACT EXTRACTION PHACO AND INTRAOCULAR LENS PLACEMENT (IOC);  Surgeon: Tarri Farm, MD;  Location: [facility];  Service: Ophthalmology;  Laterality: Right;  CDE: 8.33   CATARACT EXTRACTION W/PHACO Left 02/10/2019   Procedure: CATARACT EXTRACTION PHACO AND INTRAOCULAR LENS PLACEMENT (IOC);  Surgeon: Tarri Farm, MD;  Location: [facility];  Service: Ophthalmology;  Laterality: Left;  CDE: 5.49   CESAREAN SECTION     COLONOSCOPY  06/19/2012   Procedure: COLONOSCOPY;  Surgeon: Ruby Corporal, MD;  Location: [facility];  Service: Endoscopy;  Laterality: N/A;  1030   ORIF RADIAL FRACTURE Right 08/27/2016   Procedure: OPEN REDUCTION INTERNAL FIXATION (ORIF) RADIAL FRACTURE/DISTAL;  Surgeon: Mauricia South, MD;  Location: [facility];  Service: Plastics;  Laterality: Right;   TONSILLECTOMY AND ADENOIDECTOMY     TOTAL KNEE ARTHROPLASTY Left 03/25/2024   Procedure: ARTHROPLASTY, KNEE, TOTAL;  Surgeon: Darrin Emerald, MD;  Location: [facility];  Service: Orthopedics;  Laterality: Left;   Patient Active Problem List   Diagnosis Date Noted   S/P total knee  arthroplasty, left 03/25/24 04/08/2024   Primary osteoarthritis of left knee 03/25/2024   Fracture of right wrist 08/27/2016   Bursitis of knee 05/30/2011   KNEE PAIN 01/10/2010   CLOSED FRACTURE OF UPPER END OF TIBIA 01/10/2010   DERANGEMENT MENISCUS 01/03/2010    PCP: Minus Amel, MD   REFERRING PROVIDER: Darrin Emerald, MD  REFERRING DIAG: M17.0 (ICD-10-CM) - Primary osteoarthritis of both knees Z01.818 (ICD-10-CM) - Pre-op exam  THERAPY DIAG:  Decreased ROM of left knee  Weakness of left lower extremity  Impaired functional mobility, balance, gait, and endurance  Rationale for Evaluation and Treatment: Rehabilitation  ONSET DATE: 03/25/24  SUBJECTIVE:   SUBJECTIVE STATEMENT: Pt is s/p L TKA on 03/25/24. Pt has not been using the RW since the first couple of days of being home, pt has only been using cane on uneven surfaces. Pt states that she is about to have the right knee replaced when left knee is recovered. Pt states she loves the CPM machine and that she has even fallen asleep with LLE in it. Pt states she had some blood come from surgical site, Dr. Phyllis Breeze evaluated. Staples to be taken out this Thursday.  PERTINENT HISTORY: -HTN, controlled PAIN:  Are you having pain? Yes: NPRS scale: 5/10 Pain location: left knee Pain description: stiffness Aggravating factors: tighter in the morning Relieving factors: moving helps loosening up, ice  PRECAUTIONS: None  RED FLAGS: None   WEIGHT BEARING RESTRICTIONS: No  FALLS:  Has patient fallen in last 6 months? No  LIVING ENVIRONMENT: Lives with: lives with their spouse Lives in: House/apartment Stairs: Yes: External: 5 steps; on right going up Has following equipment at home: None  OCCUPATION: retired  PLOF: Independent  PATIENT GOALS: get back to mowing yard, would like to feel like she has a real knee, get back to cleaning, and being able to walk local trail  NEXT MD VISIT: Thursday  OBJECTIVE:   Note: Objective measures were completed at Evaluation unless otherwise noted.  DIAGNOSTIC FINDINGS:  CLINICAL DATA:  Status post left total knee arthroplasty.   EXAM: PORTABLE LEFT KNEE - 1-2 VIEW   COMPARISON:  None Available.   FINDINGS: Left knee arthroplasty in expected alignment. No periprosthetic lucency or fracture. There has been patellar resurfacing. Recent postsurgical change includes air and edema in the soft tissues and joint space. Anterior skin staples in place.   IMPRESSION: Left knee arthroplasty without immediate postoperative complication.  PATIENT SURVEYS:  LEFS 49/80  COGNITION: Overall cognitive status: Within functional limits for tasks assessed     SENSATION: Light touch: Impaired  medial malleolus on right side, felt less  EDEMA:  Left swelling some  PALPATION: Tenderness to anterior medial left knee  LOWER EXTREMITY ROM:  Active ROM Right eval Left eval  Hip flexion    Hip extension    Hip abduction    Hip adduction    Hip internal rotation    Hip external rotation    Knee flexion  100, pain   Knee extension  -4, not quite neutral  Ankle dorsiflexion    Ankle plantarflexion    Ankle inversion    Ankle eversion     (Blank rows = not tested)  LOWER EXTREMITY MMT:  MMT Right eval Left eval  Hip flexion    Hip extension    Hip abduction    Hip adduction    Hip internal rotation    Hip external rotation    Knee flexion    Knee extension    Ankle dorsiflexion    Ankle plantarflexion    Ankle inversion    Ankle eversion     (Blank rows = not tested)   FUNCTIONAL TESTS:  5 times sit to stand: 21.5 seconds  GAIT: Distance walked: 50 feet Assistive device utilized: None Level of assistance: Complete Independence Comments: Pt presents with no AD. Slight antalgic gait pattern due to recent left knee surgery. Pt demonstrates decreased velocity and decreased right stance time.                                                                                                                                 TREATMENT DATE:  04/08/2024   Evaluation: -ROM measured, Strength assessed, HEP prescribed, pt educated on prognosis, findings, and importance of HEP compliance if given.       PATIENT  EDUCATION:  Education details: Pt was educated on findings of PT evaluation, prognosis, frequency of therapy visits and rationale, attendance policy, and HEP if given.   Person educated: Patient Education method: Explanation, Verbal cues, and Handouts Education comprehension: verbalized understanding, verbal cues required, and needs further education  HOME EXERCISE PROGRAM: Access Code: [email or web address] URL: [email or web address] Date: 04/08/2024 Prepared by: Armond Bertin  Exercises - Supine Heel Slide with Strap  - 1 x daily - 3 sets - 15-20 reps - 3 sec hold - Seated Long Arc Quad  - 1 x daily - 1 sets - 10 reps - 5s hold - Active Straight Leg Raise with Quad Set  - 1 x daily - 7 x weekly - 3 sets - 10 reps - Long Sitting Quad Set with Towel Roll Under Heel  - 1 x daily - 7 x weekly - 3 sets - 10 reps - Standing Hip Abduction with Counter Support  - 1 x daily - 7 x weekly - 3 sets - 10 reps  Patient Education - Total Knee Replacement Handout  ASSESSMENT:  CLINICAL IMPRESSION: Patient is a 73 y.o. female who was seen today for physical therapy evaluation and treatment for M17.0 (ICD-10-CM) - Primary osteoarthritis of both knees Z01.818 (ICD-10-CM) - Pre-op exam.   Patient demonstrates great progress to return to baseline following LTKA, decreased LE strength, abnormal sensation of LLE, and impaired balance. Patient also demonstrates difficulty with ambulation during today's session with decreased stride length and velocity noted. Patient also demonstrates decreased L knee ROM. Patient would benefit from skilled physical therapy for increased endurance with ambulation, increased LLE strength/ROM, and balance for  improved gait quality, return to higher level of function with ADLs, and progress towards therapy goals.   OBJECTIVE IMPAIRMENTS: Abnormal gait, decreased balance, decreased endurance, decreased mobility, difficulty walking, decreased ROM, decreased strength, and impaired sensation.   ACTIVITY LIMITATIONS: carrying, lifting, bending, squatting, sleeping, stairs, transfers, and caring for others  PARTICIPATION LIMITATIONS: meal prep, cleaning, laundry, shopping, community activity, occupation, and yard work  PERSONAL FACTORS: Age, Fitness, and Time since onset of injury/illness/exacerbation are also affecting patient's functional outcome.   REHAB POTENTIAL: Good  CLINICAL DECISION MAKING: Stable/uncomplicated  EVALUATION COMPLEXITY: Low   GOALS: Goals reviewed with patient? No  SHORT TERM GOALS: Target date: 04/29/24  Patient will demonstrate evidence of independence with individualized HEP and will report compliance for at least 3 days per week for optimized progression towards remaining therapy goals. Baseline:  Goal status: INITIAL  2.  Patient will report a decrease in pain level/tightness during community ambulation by at least 2 points for improved quality of life. Baseline: see objective Goal status: INITIAL     LONG TERM GOALS: Target date: 05/20/24  Pt will demonstrate a an increase of at least 9 points on the LEFS for improved performance of community ambulation and ADL. Baseline: see objective Goal status: INITIAL  2.  Pt will demonstrate ability to complete within age related norms for improved functional ambulatory capacity in community setting.  Baseline: not tested due to antalgic gait pattern at eval Goal status: INITIAL  3.  Pt will demonstrate WFL ROM (flexion and extension) in left knee, for increased mobility and maximal efficiency of gait cycle during ambulation. Baseline: see objective Goal status: INITIAL  4.  Pt will demonstrate at least 4+/5  MMT for right lower extremity for increased strength during ADL and community ambulation. Baseline: see objective Goal status: INITIAL  5.  Pt will improve 5TSTS by  at least 2.3 seconds in order to improve LE strength during functional activities. Baseline: see objective Goal status: INITIAL    PLAN:  PT FREQUENCY: 2x/week  PT DURATION: 6 weeks  PLANNED INTERVENTIONS: 97110-Therapeutic exercises, 97530- Therapeutic activity, 97112- Neuromuscular re-education, 97535- Self Care, 54098- Manual therapy, [id number]- Gait training, Patient/Family education, Balance training, Stair training, Joint mobilization, DME instructions, Cryotherapy, and Moist heat  PLAN FOR NEXT SESSION: Complete , bike for tolerable ROM, progress LE strengthening and ROM for L knee   Armond Bertin, PT, DPT [facility] Office: [phone] 4:34 PM, 04/08/24   [facility] Medicare Auth Request Information  Date of referral: 03/07/24 Referring provider: Darrin Emerald, MD Referring diagnosis (ICD 10)? M17.0 (ICD-10-CM) - Primary osteoarthritis of both knees Z01.818 (ICD-10-CM) - Pre-op exam Treatment diagnosis (ICD 10)? (if different than referring diagnosis) M25.662 ; R29.898 ; Z74.09   Functional Tool Score: LEFS 49/80  What was this (referring dx) caused by? Surgery (Type: L TKA)  Nature of Condition: Initial Onset (within last 3 months)   Laterality: Lt  Current Functional Measure Score: LEFS 49/80  Objective measurements identify impairments when they are compared to normal values, the uninvolved extremity, and prior level of function.  [x]  Yes  []  No  Objective assessment of functional ability: Moderate functional limitations   Briefly describe symptoms: see above  How did symptoms start: see above  Average pain intensity:  Last 24 hours: see above  Past week: see above  How often does the pt experience symptoms? Frequently  How much have the symptoms interfered with  usual daily activities? A little bit  How has condition changed since care began at this facility? NA - initial visit  In general, how is the patients overall health? Good   BACK PAIN (STarT Back Screening Tool) No

## 2024-04-10 ENCOUNTER — Ambulatory Visit (INDEPENDENT_AMBULATORY_CARE_PROVIDER_SITE_OTHER): Admitting: Orthopedic Surgery

## 2024-04-10 DIAGNOSIS — Z96652 Presence of left artificial knee joint: Secondary | ICD-10-CM

## 2024-04-10 NOTE — Progress Notes (Signed)
  Plan continue exercises to regain strength and endurance left knee  Follow-up 1 month   Chief Complaint  Patient presents with   Routine Post Op    L TKA DOS 03/25/24    Encounter Diagnosis  Name Primary?   S/P total knee arthroplasty, left 03/25/24 Yes    DOI/DOS/ Date: 03/25/24 Condition improved  Doing well.  Ambulating without a cane.  Excellent range of motion 0-1 15.  Staples removed  Patient will continue physical therapy return in a month

## 2024-04-10 NOTE — Progress Notes (Signed)
   There were no vitals taken for this visit.  There is no height or weight on file to calculate BMI.  Chief Complaint  Patient presents with   Routine Post Op    Encounter Diagnosis  Name Primary?   S/P total knee arthroplasty, left 03/25/24 Yes    DOI/DOS/ Date: 03/25/24  Improved

## 2024-04-11 ENCOUNTER — Encounter (HOSPITAL_COMMUNITY): Payer: Self-pay

## 2024-04-11 ENCOUNTER — Ambulatory Visit (HOSPITAL_COMMUNITY)

## 2024-04-11 DIAGNOSIS — Z01818 Encounter for other preprocedural examination: Secondary | ICD-10-CM | POA: Diagnosis not present

## 2024-04-11 DIAGNOSIS — R29898 Other symptoms and signs involving the musculoskeletal system: Secondary | ICD-10-CM | POA: Diagnosis not present

## 2024-04-11 DIAGNOSIS — M25662 Stiffness of left knee, not elsewhere classified: Secondary | ICD-10-CM

## 2024-04-11 DIAGNOSIS — M170 Bilateral primary osteoarthritis of knee: Secondary | ICD-10-CM | POA: Diagnosis not present

## 2024-04-11 DIAGNOSIS — Z7409 Other reduced mobility: Secondary | ICD-10-CM

## 2024-04-11 NOTE — Therapy (Signed)
 OUTPATIENT PHYSICAL THERAPY LOWER EXTREMITY EVALUATION   Patient Name: Diana Skinner MRN: [id number] DOB:Apr 29, 1951, 73 y.o., female Today's Date: 04/11/2024  END OF SESSION:  PT End of Session - 04/11/24 1335     Visit Number 2    Date for PT Re-Evaluation 04/29/24    Authorization Type [employer] MEDICARE    Authorization Time Period seeking auth    Progress Note Due on Visit 10    PT Start Time 1301    PT Stop Time 1342    PT Time Calculation (min) 41 min    Activity Tolerance Patient tolerated treatment well;Patient limited by pain    Behavior During Therapy [facility] for tasks assessed/performed              Past Medical History:  Diagnosis Date   Anxiety    Arthritis    High cholesterol    HTN (hypertension)    Migraines    Past Surgical History:  Procedure Laterality Date   CATARACT EXTRACTION W/PHACO Right 01/27/2019   Procedure: CATARACT EXTRACTION PHACO AND INTRAOCULAR LENS PLACEMENT (IOC);  Surgeon: Tarri Farm, MD;  Location: [facility];  Service: Ophthalmology;  Laterality: Right;  CDE: 8.33   CATARACT EXTRACTION W/PHACO Left 02/10/2019   Procedure: CATARACT EXTRACTION PHACO AND INTRAOCULAR LENS PLACEMENT (IOC);  Surgeon: Tarri Farm, MD;  Location: [facility];  Service: Ophthalmology;  Laterality: Left;  CDE: 5.49   CESAREAN SECTION     COLONOSCOPY  06/19/2012   Procedure: COLONOSCOPY;  Surgeon: Ruby Corporal, MD;  Location: [facility];  Service: Endoscopy;  Laterality: N/A;  1030   ORIF RADIAL FRACTURE Right 08/27/2016   Procedure: OPEN REDUCTION INTERNAL FIXATION (ORIF) RADIAL FRACTURE/DISTAL;  Surgeon: Mauricia South, MD;  Location: [facility];  Service: Plastics;  Laterality: Right;   TONSILLECTOMY AND ADENOIDECTOMY     TOTAL KNEE ARTHROPLASTY Left 03/25/2024   Procedure: ARTHROPLASTY, KNEE, TOTAL;  Surgeon: Darrin Emerald, MD;  Location: [facility];  Service: Orthopedics;  Laterality: Left;   Patient Active Problem List   Diagnosis Date Noted   S/P total knee  arthroplasty, left 03/25/24 04/08/2024   Primary osteoarthritis of left knee 03/25/2024   Fracture of right wrist 08/27/2016   Bursitis of knee 05/30/2011   KNEE PAIN 01/10/2010   CLOSED FRACTURE OF UPPER END OF TIBIA 01/10/2010   DERANGEMENT MENISCUS 01/03/2010    PCP: Minus Amel, MD   REFERRING PROVIDER: Darrin Emerald, MD  REFERRING DIAG: M17.0 (ICD-10-CM) - Primary osteoarthritis of both knees Z01.818 (ICD-10-CM) - Pre-op exam  THERAPY DIAG:  Decreased ROM of left knee  Weakness of left lower extremity  Impaired functional mobility, balance, gait, and endurance  Rationale for Evaluation and Treatment: Rehabilitation  ONSET DATE: 03/25/24  SUBJECTIVE:   SUBJECTIVE STATEMENT: Pt states she just got staples out yesterday. Slight tingling feeling during flexion based exercises today. Pt also reports lingering numbness of L ankle and medial foot. Pt reports no pain in L knee following session today.   Pt is s/p L TKA on 03/25/24. Pt has not been using the RW since the first couple of days of being home, pt has only been using cane on uneven surfaces. Pt states that she is about to have the right knee replaced when left knee is recovered. Pt states she loves the CPM machine and that she has even fallen asleep with LLE in it. Pt states she had some blood come from surgical site, Dr. Phyllis Breeze evaluated. Staples to be taken out this  Thursday.  PERTINENT HISTORY: -HTN, controlled PAIN:  Are you having pain? Yes: NPRS scale: 5/10 Pain location: left knee Pain description: stiffness Aggravating factors: tighter in the morning Relieving factors: moving helps loosening up, ice  PRECAUTIONS: None  RED FLAGS: None   WEIGHT BEARING RESTRICTIONS: No  FALLS:  Has patient fallen in last 6 months? No  LIVING ENVIRONMENT: Lives with: lives with their spouse Lives in: House/apartment Stairs: Yes: External: 5 steps; on right going up Has following equipment at home:  None  OCCUPATION: retired  PLOF: Independent  PATIENT GOALS: get back to mowing yard, would like to feel like she has a real knee, get back to cleaning, and being able to walk local trail  NEXT MD VISIT: Thursday  OBJECTIVE:  Note: Objective measures were completed at Evaluation unless otherwise noted.  DIAGNOSTIC FINDINGS:  CLINICAL DATA:  Status post left total knee arthroplasty.   EXAM: PORTABLE LEFT KNEE - 1-2 VIEW   COMPARISON:  None Available.   FINDINGS: Left knee arthroplasty in expected alignment. No periprosthetic lucency or fracture. There has been patellar resurfacing. Recent postsurgical change includes air and edema in the soft tissues and joint space. Anterior skin staples in place.   IMPRESSION: Left knee arthroplasty without immediate postoperative complication.  PATIENT SURVEYS:  LEFS 49/80  COGNITION: Overall cognitive status: Within functional limits for tasks assessed     SENSATION: Light touch: Impaired  medial malleolus on right side, felt less  EDEMA:  Left swelling some  PALPATION: Tenderness to anterior medial left knee  LOWER EXTREMITY ROM:  Active ROM Right eval Left eval  Hip flexion    Hip extension    Hip abduction    Hip adduction    Hip internal rotation    Hip external rotation    Knee flexion  100, pain   Knee extension  -4, not quite neutral  Ankle dorsiflexion    Ankle plantarflexion    Ankle inversion    Ankle eversion     (Blank rows = not tested)  LOWER EXTREMITY MMT:  MMT Right eval Left eval  Hip flexion    Hip extension    Hip abduction    Hip adduction    Hip internal rotation    Hip external rotation    Knee flexion    Knee extension    Ankle dorsiflexion    Ankle plantarflexion    Ankle inversion    Ankle eversion     (Blank rows = not tested)   FUNCTIONAL TESTS:  5 times sit to stand: 21.5 seconds  GAIT: Distance walked: 50 feet Assistive device utilized: None Level of assistance:  Complete Independence Comments: Pt presents with no AD. Slight antalgic gait pattern due to recent left knee surgery. Pt demonstrates decreased velocity and decreased right stance time.  TREATMENT DATE:  04/11/2024  Therapeutic Exercise: - -Bike, 5 min, seat 11, pt cued for not completing full rotation today -Heel slides with green strap for OP, 10 reps, 10 second holds -Supine bridges 2 sets of 10 reps, 3 second holds, symptomatic, pt cued for max hip extension -Standing 3 way hip 1 sets 7 reps, bilaterally, pt cued for upright trunk and maintaining of neutral spine -Aeromat walks tandem/lateral steps, 2 laps each variation, pr cued for decreased UE support and upright posture -TKE, 2 inch step, 2 sets of 10 reps, pt cued for decreased WB on RLE -Lateral stepping 3 laps 20 feet per lap, second 2 with RTB around ankles, pt cued for upright posture -Knee drive stretch, 10 second holds, 10 reps  Therapeutic Activity: -Sit to stands, 2 sets of 10 reps, pt cued for core activation, throughout session -Step up and overs, 1 sets of 6 reps 4 inch step -Lateral step up and overs, 2 sets of 6 reps, pt cued for no UE support the second set, 4 inch step   04/08/2024   Evaluation: -ROM measured, Strength assessed, HEP prescribed, pt educated on prognosis, findings, and importance of HEP compliance if given.       PATIENT EDUCATION:  Education details: Pt was educated on findings of PT evaluation, prognosis, frequency of therapy visits and rationale, attendance policy, and HEP if given.   Person educated: Patient Education method: Explanation, Verbal cues, and Handouts Education comprehension: verbalized understanding, verbal cues required, and needs further education  HOME EXERCISE PROGRAM: Access Code: [email or web address] URL: [email or web address] Date:  04/08/2024 Prepared by: Armond Bertin  Exercises - Supine Heel Slide with Strap  - 1 x daily - 3 sets - 15-20 reps - 3 sec hold - Seated Long Arc Quad  - 1 x daily - 1 sets - 10 reps - 5s hold - Active Straight Leg Raise with Quad Set  - 1 x daily - 7 x weekly - 3 sets - 10 reps - Long Sitting Quad Set with Towel Roll Under Heel  - 1 x daily - 7 x weekly - 3 sets - 10 reps - Standing Hip Abduction with Counter Support  - 1 x daily - 7 x weekly - 3 sets - 10 reps  Patient Education - Total Knee Replacement Handout  ASSESSMENT:  CLINICAL IMPRESSION: Patient continues to demonstrate great progress with recovery from LTKA. Patient demonstrates increased ROM, increased activity tolerance, improved strength and balance. Patient able to progress dynamic balance and core activation exercises today with good performance with verbal cueing. Pt obtained 105 degrees of knee flexion today during knee drive stretch. Patient would continue to benefit from skilled physical therapy for improved gait pattern, increased L knee ROM, increased LLE strength, and improved balance for improved quality of life, improved community ambulation and continued progress towards therapy goals.    Patient demonstrates great progress to return to baseline following LTKA, decreased LE strength, abnormal sensation of LLE, and impaired balance. Patient also demonstrates difficulty with ambulation during today's session with decreased stride length and velocity noted. Patient also demonstrates decreased L knee ROM. Patient would benefit from skilled physical therapy for increased endurance with ambulation, increased LLE strength/ROM, and balance for improved gait quality, return to higher level of function with ADLs, and progress towards therapy goals.   OBJECTIVE IMPAIRMENTS: Abnormal gait, decreased balance, decreased endurance, decreased mobility, difficulty walking, decreased ROM, decreased strength, and impaired sensation.    ACTIVITY LIMITATIONS: carrying, lifting, bending, squatting, sleeping, stairs,  transfers, and caring for others  PARTICIPATION LIMITATIONS: meal prep, cleaning, laundry, shopping, community activity, occupation, and yard work  PERSONAL FACTORS: Age, Fitness, and Time since onset of injury/illness/exacerbation are also affecting patient's functional outcome.   REHAB POTENTIAL: Good  CLINICAL DECISION MAKING: Stable/uncomplicated  EVALUATION COMPLEXITY: Low   GOALS: Goals reviewed with patient? No  SHORT TERM GOALS: Target date: 04/29/24  Patient will demonstrate evidence of independence with individualized HEP and will report compliance for at least 3 days per week for optimized progression towards remaining therapy goals. Baseline:  Goal status: INITIAL  2.  Patient will report a decrease in pain level/tightness during community ambulation by at least 2 points for improved quality of life. Baseline: see objective Goal status: INITIAL     LONG TERM GOALS: Target date: 05/20/24  Pt will demonstrate a an increase of at least 9 points on the LEFS for improved performance of community ambulation and ADL. Baseline: see objective Goal status: INITIAL  2.  Pt will demonstrate ability to complete within age related norms for improved functional ambulatory capacity in community setting.  Baseline: not tested due to antalgic gait pattern at eval Goal status: INITIAL  3.  Pt will demonstrate WFL ROM (flexion and extension) in left knee, for increased mobility and maximal efficiency of gait cycle during ambulation. Baseline: see objective Goal status: INITIAL  4.  Pt will demonstrate at least 4+/5 MMT for right lower extremity for increased strength during ADL and community ambulation. Baseline: see objective Goal status: INITIAL  5.  Pt will improve 5TSTS by at least 2.3 seconds in order to improve LE strength during functional activities. Baseline: see objective Goal  status: INITIAL    PLAN:  PT FREQUENCY: 2x/week  PT DURATION: 6 weeks  PLANNED INTERVENTIONS: 97110-Therapeutic exercises, 97530- Therapeutic activity, 97112- Neuromuscular re-education, 97535- Self Care, 16109- Manual therapy, [phone]- Gait training, Patient/Family education, Balance training, Stair training, Joint mobilization, DME instructions, Cryotherapy, and Moist heat  PLAN FOR NEXT SESSION: Complete , bike for tolerable ROM, progress LE strengthening and ROM for L knee   Armond Bertin, PT, DPT [facility] Office: [phone] 1:51 PM, 04/11/24

## 2024-04-15 ENCOUNTER — Ambulatory Visit (HOSPITAL_COMMUNITY)

## 2024-04-18 ENCOUNTER — Encounter (HOSPITAL_COMMUNITY): Payer: Self-pay

## 2024-04-18 ENCOUNTER — Ambulatory Visit (HOSPITAL_COMMUNITY)

## 2024-04-18 DIAGNOSIS — Z7409 Other reduced mobility: Secondary | ICD-10-CM | POA: Diagnosis not present

## 2024-04-18 DIAGNOSIS — R29898 Other symptoms and signs involving the musculoskeletal system: Secondary | ICD-10-CM

## 2024-04-18 DIAGNOSIS — Z01818 Encounter for other preprocedural examination: Secondary | ICD-10-CM | POA: Diagnosis not present

## 2024-04-18 DIAGNOSIS — M170 Bilateral primary osteoarthritis of knee: Secondary | ICD-10-CM | POA: Diagnosis not present

## 2024-04-18 DIAGNOSIS — M25662 Stiffness of left knee, not elsewhere classified: Secondary | ICD-10-CM

## 2024-04-18 NOTE — Therapy (Signed)
 OUTPATIENT PHYSICAL THERAPY LOWER EXTREMITY EVALUATION   Patient Name: Diana Skinner MRN: [id number] DOB:October 27, 1951, 73 y.o., female Today's Date: 04/18/2024  END OF SESSION:  PT End of Session - 04/18/24 1258     Visit Number 3    Date for PT Re-Evaluation 04/29/24    Authorization Type [employer] MEDICARE    Authorization Time Period seeking auth    Progress Note Due on Visit 10    PT Start Time 1259    PT Stop Time 1341    PT Time Calculation (min) 42 min    Activity Tolerance Patient tolerated treatment well;Patient limited by pain    Behavior During Therapy [facility] for tasks assessed/performed               Past Medical History:  Diagnosis Date   Anxiety    Arthritis    High cholesterol    HTN (hypertension)    Migraines    Past Surgical History:  Procedure Laterality Date   CATARACT EXTRACTION W/PHACO Right 01/27/2019   Procedure: CATARACT EXTRACTION PHACO AND INTRAOCULAR LENS PLACEMENT (IOC);  Surgeon: Tarri Farm, MD;  Location: [facility];  Service: Ophthalmology;  Laterality: Right;  CDE: 8.33   CATARACT EXTRACTION W/PHACO Left 02/10/2019   Procedure: CATARACT EXTRACTION PHACO AND INTRAOCULAR LENS PLACEMENT (IOC);  Surgeon: Tarri Farm, MD;  Location: [facility];  Service: Ophthalmology;  Laterality: Left;  CDE: 5.49   CESAREAN SECTION     COLONOSCOPY  06/19/2012   Procedure: COLONOSCOPY;  Surgeon: Ruby Corporal, MD;  Location: [facility];  Service: Endoscopy;  Laterality: N/A;  1030   ORIF RADIAL FRACTURE Right 08/27/2016   Procedure: OPEN REDUCTION INTERNAL FIXATION (ORIF) RADIAL FRACTURE/DISTAL;  Surgeon: Mauricia South, MD;  Location: [facility];  Service: Plastics;  Laterality: Right;   TONSILLECTOMY AND ADENOIDECTOMY     TOTAL KNEE ARTHROPLASTY Left 03/25/2024   Procedure: ARTHROPLASTY, KNEE, TOTAL;  Surgeon: Darrin Emerald, MD;  Location: [facility];  Service: Orthopedics;  Laterality: Left;   Patient Active Problem List   Diagnosis Date Noted   S/P total knee  arthroplasty, left 03/25/24 04/08/2024   Primary osteoarthritis of left knee 03/25/2024   Fracture of right wrist 08/27/2016   Bursitis of knee 05/30/2011   KNEE PAIN 01/10/2010   CLOSED FRACTURE OF UPPER END OF TIBIA 01/10/2010   DERANGEMENT MENISCUS 01/03/2010    PCP: Minus Amel, MD   REFERRING PROVIDER: Darrin Emerald, MD  REFERRING DIAG: M17.0 (ICD-10-CM) - Primary osteoarthritis of both knees Z01.818 (ICD-10-CM) - Pre-op exam  THERAPY DIAG:  Decreased ROM of left knee  Weakness of left lower extremity  Impaired functional mobility, balance, gait, and endurance  Rationale for Evaluation and Treatment: Rehabilitation  ONSET DATE: 03/25/24  SUBJECTIVE:   SUBJECTIVE STATEMENT: Pt states she was really sore Friday night and took pain meds. Pt reports compliance with HEP. Pt reports 0/10 ache long are still bothersome.   Pt is s/p L TKA on 03/25/24. Pt has not been using the RW since the first couple of days of being home, pt has only been using cane on uneven surfaces. Pt states that she is about to have the right knee replaced when left knee is recovered. Pt states she loves the CPM machine and that she has even fallen asleep with LLE in it. Pt states she had some blood come from surgical site, Dr. Phyllis Breeze evaluated. Staples to be taken out this Thursday.  PERTINENT HISTORY: -HTN, controlled PAIN:  Are you having  pain? Yes: NPRS scale: 5/10 Pain location: left knee Pain description: stiffness Aggravating factors: tighter in the morning Relieving factors: moving helps loosening up, ice  PRECAUTIONS: None  RED FLAGS: None   WEIGHT BEARING RESTRICTIONS: No  FALLS:  Has patient fallen in last 6 months? No  LIVING ENVIRONMENT: Lives with: lives with their spouse Lives in: House/apartment Stairs: Yes: External: 5 steps; on right going up Has following equipment at home: None  OCCUPATION: retired  PLOF: Independent  PATIENT GOALS: get back to mowing  yard, would like to feel like she has a real knee, get back to cleaning, and being able to walk local trail  NEXT MD VISIT: Thursday  OBJECTIVE:  Note: Objective measures were completed at Evaluation unless otherwise noted.  DIAGNOSTIC FINDINGS:  CLINICAL DATA:  Status post left total knee arthroplasty.   EXAM: PORTABLE LEFT KNEE - 1-2 VIEW   COMPARISON:  None Available.   FINDINGS: Left knee arthroplasty in expected alignment. No periprosthetic lucency or fracture. There has been patellar resurfacing. Recent postsurgical change includes air and edema in the soft tissues and joint space. Anterior skin staples in place.   IMPRESSION: Left knee arthroplasty without immediate postoperative complication.  PATIENT SURVEYS:  LEFS 49/80  COGNITION: Overall cognitive status: Within functional limits for tasks assessed     SENSATION: Light touch: Impaired  medial malleolus on right side, felt less  EDEMA:  Left swelling some  PALPATION: Tenderness to anterior medial left knee  LOWER EXTREMITY ROM:  Active ROM Right eval Left eval  Hip flexion    Hip extension    Hip abduction    Hip adduction    Hip internal rotation    Hip external rotation    Knee flexion  100, pain   Knee extension  -4, not quite neutral  Ankle dorsiflexion    Ankle plantarflexion    Ankle inversion    Ankle eversion     (Blank rows = not tested)  LOWER EXTREMITY MMT:  MMT Right eval Left eval  Hip flexion    Hip extension    Hip abduction    Hip adduction    Hip internal rotation    Hip external rotation    Knee flexion    Knee extension    Ankle dorsiflexion    Ankle plantarflexion    Ankle inversion    Ankle eversion     (Blank rows = not tested)   FUNCTIONAL TESTS:  5 times sit to stand: 21.5 seconds  GAIT: Distance walked: 50 feet Assistive device utilized: None Level of assistance: Complete Independence Comments: Pt presents with no AD. Slight antalgic gait pattern  due to recent left knee surgery. Pt demonstrates decreased velocity and decreased right stance time.                                                                                                                                TREATMENT DATE:  04/18/2024  Therapeutic Exercise: -Bike, 5 min, seat 11, pt cued for not completing full rotation today -Heel slides with green strap for OP, 2 sets of 15 reps, pt cued for controlled motion (115 flexion obtained) -Supine bridges 2 sets of 8 reps, 3 second holds, pt cued for max hip extension -Standing 3 way hip 2 sets 5 reps, bilaterally, pt cued for upright trunk and maintaining of neutral spine -Aeromat walks tandem/lateral steps, 1 laps each variation, in parallel bars, pt cued for decreased UE support and upright posture -Cone touches while standing on blue foam, 2 sets of 10 reps bilaterally, pt cued for decreased UE support -Cone touches while standing on blue foam, 1 sets of 5 reps bilaterally, pt cued for decreased UE support -TKE, 4 inch step, 2 sets of 8 reps, pt cued for decreased WB on nonworking LE, pt uses one UE support -Knee drive stretch, 10 second holds, 8 reps -Calf stretch on incline, 2 reps, 30 second holds  Therapeutic Activity: -Sit to stands to SLS on LLE with TTWB on RLE yellow ball trampoline toss, 2 sets of 5 reps -Walking marches, 1 laps, 40 feet lap, pt cued for increased left knee flexion and left hip flexion -Lateral step up and overs, 2 sets of 8 reps, pt cued for no UE support the second set, 4 inch step    04/11/2024  Therapeutic Exercise: - -Bike, 5 min, seat 11, pt cued for not completing full rotation today -Heel slides with green strap for OP, 10 reps, 10 second holds -Supine bridges 2 sets of 10 reps, 3 second holds, symptomatic, pt cued for max hip extension -Standing 3 way hip 1 sets 7 reps, bilaterally, pt cued for upright trunk and maintaining of neutral spine -Aeromat walks tandem/lateral steps, 2  laps each variation, pr cued for decreased UE support and upright posture -TKE, 2 inch step, 2 sets of 10 reps, pt cued for decreased WB on RLE -Lateral stepping 3 laps 20 feet per lap, second 2 with RTB around ankles, pt cued for upright posture -Knee drive stretch, 10 second holds, 10 reps  Therapeutic Activity: -Sit to stands, 2 sets of 10 reps, pt cued for core activation, throughout session -Step up and overs, 1 sets of 6 reps 4 inch step -Lateral step up and overs, 2 sets of 6 reps, pt cued for no UE support the second set, 4 inch step   04/08/2024   Evaluation: -ROM measured, Strength assessed, HEP prescribed, pt educated on prognosis, findings, and importance of HEP compliance if given.       PATIENT EDUCATION:  Education details: Pt was educated on findings of PT evaluation, prognosis, frequency of therapy visits and rationale, attendance policy, and HEP if given.   Person educated: Patient Education method: Explanation, Verbal cues, and Handouts Education comprehension: verbalized understanding, verbal cues required, and needs further education  HOME EXERCISE PROGRAM: Access Code: [email or web address] URL: [email or web address] Date: 04/08/2024 Prepared by: Armond Bertin  Exercises - Supine Heel Slide with Strap  - 1 x daily - 3 sets - 15-20 reps - 3 sec hold - Seated Long Arc Quad  - 1 x daily - 1 sets - 10 reps - 5s hold - Active Straight Leg Raise with Quad Set  - 1 x daily - 7 x weekly - 3 sets - 10 reps - Long Sitting Quad Set with Towel Roll Under Heel  - 1 x daily - 7 x weekly - 3 sets - 10 reps - Standing  Hip Abduction with Counter Support  - 1 x daily - 7 x weekly - 3 sets - 10 reps  Patient Education - Total Knee Replacement Handout  ASSESSMENT:  CLINICAL IMPRESSION: Patient continues to demonstrate good progress with recovery from LTKA. Patient demonstrates 115 degrees of flexion during heel slides, increased activity tolerance, improved strength and  balance. Patient able to progress dynamic balance and core activation exercises today with STS variation, good performance with verbal cueing. Regressed several exercises today due to increased knee pain following previous session. Patient would continue to benefit from skilled physical therapy for improved gait pattern, increased L knee ROM, increased LLE strength, and improved balance for improved quality of life, improved community ambulation and continued progress towards therapy goals.    Patient demonstrates great progress to return to baseline following LTKA, decreased LE strength, abnormal sensation of LLE, and impaired balance. Patient also demonstrates difficulty with ambulation during today's session with decreased stride length and velocity noted. Patient also demonstrates decreased L knee ROM. Patient would benefit from skilled physical therapy for increased endurance with ambulation, increased LLE strength/ROM, and balance for improved gait quality, return to higher level of function with ADLs, and progress towards therapy goals.   OBJECTIVE IMPAIRMENTS: Abnormal gait, decreased balance, decreased endurance, decreased mobility, difficulty walking, decreased ROM, decreased strength, and impaired sensation.   ACTIVITY LIMITATIONS: carrying, lifting, bending, squatting, sleeping, stairs, transfers, and caring for others  PARTICIPATION LIMITATIONS: meal prep, cleaning, laundry, shopping, community activity, occupation, and yard work  PERSONAL FACTORS: Age, Fitness, and Time since onset of injury/illness/exacerbation are also affecting patient's functional outcome.   REHAB POTENTIAL: Good  CLINICAL DECISION MAKING: Stable/uncomplicated  EVALUATION COMPLEXITY: Low   GOALS: Goals reviewed with patient? No  SHORT TERM GOALS: Target date: 04/29/24  Patient will demonstrate evidence of independence with individualized HEP and will report compliance for at least 3 days per week for  optimized progression towards remaining therapy goals. Baseline:  Goal status: INITIAL  2.  Patient will report a decrease in pain level/tightness during community ambulation by at least 2 points for improved quality of life. Baseline: see objective Goal status: INITIAL     LONG TERM GOALS: Target date: 05/20/24  Pt will demonstrate a an increase of at least 9 points on the LEFS for improved performance of community ambulation and ADL. Baseline: see objective Goal status: INITIAL  2.  Pt will demonstrate ability to complete within age related norms for improved functional ambulatory capacity in community setting.  Baseline: not tested due to antalgic gait pattern at eval Goal status: INITIAL  3.  Pt will demonstrate WFL ROM (flexion and extension) in left knee, for increased mobility and maximal efficiency of gait cycle during ambulation. Baseline: see objective Goal status: INITIAL  4.  Pt will demonstrate at least 4+/5 MMT for right lower extremity for increased strength during ADL and community ambulation. Baseline: see objective Goal status: INITIAL  5.  Pt will improve 5TSTS by at least 2.3 seconds in order to improve LE strength during functional activities. Baseline: see objective Goal status: INITIAL    PLAN:  PT FREQUENCY: 2x/week  PT DURATION: 6 weeks  PLANNED INTERVENTIONS: 97110-Therapeutic exercises, 97530- Therapeutic activity, 97112- Neuromuscular re-education, 97535- Self Care, 16109- Manual therapy, [phone]- Gait training, Patient/Family education, Balance training, Stair training, Joint mobilization, DME instructions, Cryotherapy, and Moist heat  PLAN FOR NEXT SESSION: Progress LE strengthening and ROM for L knee   Armond Bertin, PT, DPT [facility] Office: [phone] 1:44 PM, 04/18/24

## 2024-04-22 ENCOUNTER — Encounter (HOSPITAL_COMMUNITY): Payer: Self-pay

## 2024-04-22 ENCOUNTER — Ambulatory Visit (HOSPITAL_COMMUNITY)

## 2024-04-22 DIAGNOSIS — Z7409 Other reduced mobility: Secondary | ICD-10-CM | POA: Diagnosis not present

## 2024-04-22 DIAGNOSIS — R29898 Other symptoms and signs involving the musculoskeletal system: Secondary | ICD-10-CM

## 2024-04-22 DIAGNOSIS — Z01818 Encounter for other preprocedural examination: Secondary | ICD-10-CM | POA: Diagnosis not present

## 2024-04-22 DIAGNOSIS — M25662 Stiffness of left knee, not elsewhere classified: Secondary | ICD-10-CM

## 2024-04-22 DIAGNOSIS — M170 Bilateral primary osteoarthritis of knee: Secondary | ICD-10-CM | POA: Diagnosis not present

## 2024-04-22 NOTE — Therapy (Signed)
 OUTPATIENT PHYSICAL THERAPY LOWER EXTREMITY EVALUATION   Patient Name: Diana Skinner MRN: [id number] DOB:04/27/1951, 73 y.o., female Today's Date: 04/22/2024  END OF SESSION:  PT End of Session - 04/22/24 1350     Visit Number 4    Date for PT Re-Evaluation 04/29/24    Authorization Type [employer] MEDICARE    Authorization Time Period seeking auth    Progress Note Due on Visit 10    PT Start Time 1348    PT Stop Time 1426    PT Time Calculation (min) 38 min    Activity Tolerance Patient tolerated treatment well;Patient limited by pain    Behavior During Therapy [facility] for tasks assessed/performed                Past Medical History:  Diagnosis Date   Anxiety    Arthritis    High cholesterol    HTN (hypertension)    Migraines    Past Surgical History:  Procedure Laterality Date   CATARACT EXTRACTION W/PHACO Right 01/27/2019   Procedure: CATARACT EXTRACTION PHACO AND INTRAOCULAR LENS PLACEMENT (IOC);  Surgeon: Tarri Farm, MD;  Location: [facility];  Service: Ophthalmology;  Laterality: Right;  CDE: 8.33   CATARACT EXTRACTION W/PHACO Left 02/10/2019   Procedure: CATARACT EXTRACTION PHACO AND INTRAOCULAR LENS PLACEMENT (IOC);  Surgeon: Tarri Farm, MD;  Location: [facility];  Service: Ophthalmology;  Laterality: Left;  CDE: 5.49   CESAREAN SECTION     COLONOSCOPY  06/19/2012   Procedure: COLONOSCOPY;  Surgeon: Ruby Corporal, MD;  Location: [facility];  Service: Endoscopy;  Laterality: N/A;  1030   ORIF RADIAL FRACTURE Right 08/27/2016   Procedure: OPEN REDUCTION INTERNAL FIXATION (ORIF) RADIAL FRACTURE/DISTAL;  Surgeon: Mauricia South, MD;  Location: [facility];  Service: Plastics;  Laterality: Right;   TONSILLECTOMY AND ADENOIDECTOMY     TOTAL KNEE ARTHROPLASTY Left 03/25/2024   Procedure: ARTHROPLASTY, KNEE, TOTAL;  Surgeon: Darrin Emerald, MD;  Location: [facility];  Service: Orthopedics;  Laterality: Left;   Patient Active Problem List   Diagnosis Date Noted   S/P total knee  arthroplasty, left 03/25/24 04/08/2024   Primary osteoarthritis of left knee 03/25/2024   Fracture of right wrist 08/27/2016   Bursitis of knee 05/30/2011   KNEE PAIN 01/10/2010   CLOSED FRACTURE OF UPPER END OF TIBIA 01/10/2010   DERANGEMENT MENISCUS 01/03/2010    PCP: Minus Amel, MD   REFERRING PROVIDER: Darrin Emerald, MD  REFERRING DIAG: M17.0 (ICD-10-CM) - Primary osteoarthritis of both knees Z01.818 (ICD-10-CM) - Pre-op exam  THERAPY DIAG:  Decreased ROM of left knee  Weakness of left lower extremity  Impaired functional mobility, balance, gait, and endurance  Rationale for Evaluation and Treatment: Rehabilitation  ONSET DATE: 03/25/24  SUBJECTIVE:   SUBJECTIVE STATEMENT: Pt states she was just a little sore from the exercises last session. Pt states she has noticed knee has felt warm to the touch and has been icing it, asks if this is normal.  Pt is s/p L TKA on 03/25/24. Pt has not been using the RW since the first couple of days of being home, pt has only been using cane on uneven surfaces. Pt states that she is about to have the right knee replaced when left knee is recovered. Pt states she loves the CPM machine and that she has even fallen asleep with LLE in it. Pt states she had some blood come from surgical site, Dr. Phyllis Breeze evaluated. Staples to be taken out this Thursday.  PERTINENT HISTORY: -HTN, controlled PAIN:  Are you having pain? Yes: NPRS scale: 5/10 Pain location: left knee Pain description: stiffness Aggravating factors: tighter in the morning Relieving factors: moving helps loosening up, ice  PRECAUTIONS: None  RED FLAGS: None   WEIGHT BEARING RESTRICTIONS: No  FALLS:  Has patient fallen in last 6 months? No  LIVING ENVIRONMENT: Lives with: lives with their spouse Lives in: House/apartment Stairs: Yes: External: 5 steps; on right going up Has following equipment at home: None  OCCUPATION: retired  PLOF:  Independent  PATIENT GOALS: get back to mowing yard, would like to feel like she has a real knee, get back to cleaning, and being able to walk local trail  NEXT MD VISIT: Thursday  OBJECTIVE:  Note: Objective measures were completed at Evaluation unless otherwise noted.  DIAGNOSTIC FINDINGS:  CLINICAL DATA:  Status post left total knee arthroplasty.   EXAM: PORTABLE LEFT KNEE - 1-2 VIEW   COMPARISON:  None Available.   FINDINGS: Left knee arthroplasty in expected alignment. No periprosthetic lucency or fracture. There has been patellar resurfacing. Recent postsurgical change includes air and edema in the soft tissues and joint space. Anterior skin staples in place.   IMPRESSION: Left knee arthroplasty without immediate postoperative complication.  PATIENT SURVEYS:  LEFS 49/80  COGNITION: Overall cognitive status: Within functional limits for tasks assessed     SENSATION: Light touch: Impaired  medial malleolus on right side, felt less  EDEMA:  Left swelling some  PALPATION: Tenderness to anterior medial left knee  LOWER EXTREMITY ROM:  Active ROM Right eval Left eval  Hip flexion    Hip extension    Hip abduction    Hip adduction    Hip internal rotation    Hip external rotation    Knee flexion  100, pain   Knee extension  -4, not quite neutral  Ankle dorsiflexion    Ankle plantarflexion    Ankle inversion    Ankle eversion     (Blank rows = not tested)  LOWER EXTREMITY MMT:  MMT Right eval Left eval  Hip flexion    Hip extension    Hip abduction    Hip adduction    Hip internal rotation    Hip external rotation    Knee flexion    Knee extension    Ankle dorsiflexion    Ankle plantarflexion    Ankle inversion    Ankle eversion     (Blank rows = not tested)   FUNCTIONAL TESTS:  5 times sit to stand: 21.5 seconds  GAIT: Distance walked: 50 feet Assistive device utilized: None Level of assistance: Complete Independence Comments: Pt  presents with no AD. Slight antalgic gait pattern due to recent left knee surgery. Pt demonstrates decreased velocity and decreased right stance time.  TREATMENT DATE:  04/22/2024  Therapeutic Exercise: -Supine bridges 2 sets of 10 reps, 3 second holds, pt cued for max hip extension -DKTC, 2 sets of 10 reps on green exercise ball, pt cued for slow controlled movement -Ankle weight 3#, walking marches/butt kicks, 3 laps each variation, 60 foot laps -Standing 3 way hip on floor cross, 1 sets 10 reps, LLE only, pt cued for upright trunk and maintaining of neutral spine -Lateral stepping 2 laps 20 feet per lap, with RTB around ankles, pt cued for upright posture and decreased toe out compensation -Cone touches from blue foam, 3 sets of 10 reps, bilaterally   Neuromuscular Re-education: -PPT 1 set of 10 reps, 3 second holds, pr cued to remain in pain free ROM -LTR 1 set of 10 reps bilaterally, pt cued to remain in pain free ROM  Therapeutic Activity: -Sit to stands, 2 sets of 4 reps, throughout session -Lateral step up and overs, 8 inch step, 2 sets of 7 reps -Stair ambulation, 3 trials, pt cued for decreased UE support  04/18/2024  Therapeutic Exercise: -Bike, 5 min, seat 11, pt cued for not completing full rotation today -Heel slides with green strap for OP, 2 sets of 15 reps, pt cued for controlled motion (115 flexion obtained) -Supine bridges 2 sets of 8 reps, 3 second holds, pt cued for max hip extension -Standing 3 way hip 2 sets 5 reps, bilaterally, pt cued for upright trunk and maintaining of neutral spine -Aeromat walks tandem/lateral steps, 1 laps each variation, in parallel bars, pt cued for decreased UE support and upright posture -Cone touches while standing on blue foam, 2 sets of 10 reps bilaterally, pt cued for decreased UE support -Cone touches while  standing on blue foam, 1 sets of 5 reps bilaterally, pt cued for decreased UE support -TKE, 4 inch step, 2 sets of 8 reps, pt cued for decreased WB on nonworking LE, pt uses one UE support -Knee drive stretch, 10 second holds, 8 reps -Calf stretch on incline, 2 reps, 30 second holds  Therapeutic Activity: -Sit to stands to SLS on LLE with TTWB on RLE yellow ball trampoline toss, 2 sets of 5 reps -Walking marches, 1 laps, 40 feet lap, pt cued for increased left knee flexion and left hip flexion -Lateral step up and overs, 2 sets of 8 reps, pt cued for no UE support the second set, 4 inch step    04/11/2024  Therapeutic Exercise: - -Bike, 5 min, seat 11, pt cued for not completing full rotation today -Heel slides with green strap for OP, 10 reps, 10 second holds -Supine bridges 2 sets of 10 reps, 3 second holds, symptomatic, pt cued for max hip extension -Standing 3 way hip 1 sets 7 reps, bilaterally, pt cued for upright trunk and maintaining of neutral spine -Aeromat walks tandem/lateral steps, 2 laps each variation, pr cued for decreased UE support and upright posture -TKE, 2 inch step, 2 sets of 10 reps, pt cued for decreased WB on RLE -Lateral stepping 3 laps 20 feet per lap, second 2 with RTB around ankles, pt cued for upright posture -Knee drive stretch, 10 second holds, 10 reps  Therapeutic Activity: -Sit to stands, 2 sets of 10 reps, pt cued for core activation, throughout session -Step up and overs, 1 sets of 6 reps 4 inch step -Lateral step up and overs, 2 sets of 6 reps, pt cued for no UE support the second set, 4 inch step   04/08/2024  Evaluation: -ROM measured, Strength assessed, HEP prescribed, pt educated on prognosis, findings, and importance of HEP compliance if given.       PATIENT EDUCATION:  Education details: Pt was educated on findings of PT evaluation, prognosis, frequency of therapy visits and rationale, attendance policy, and HEP if given.   Person  educated: Patient Education method: Explanation, Verbal cues, and Handouts Education comprehension: verbalized understanding, verbal cues required, and needs further education  HOME EXERCISE PROGRAM: Access Code: [email or web address] URL: [email or web address] Date: 04/08/2024 Prepared by: Armond Bertin  Exercises - Supine Heel Slide with Strap  - 1 x daily - 3 sets - 15-20 reps - 3 sec hold - Seated Long Arc Quad  - 1 x daily - 1 sets - 10 reps - 5s hold - Active Straight Leg Raise with Quad Set  - 1 x daily - 7 x weekly - 3 sets - 10 reps - Long Sitting Quad Set with Towel Roll Under Heel  - 1 x daily - 7 x weekly - 3 sets - 10 reps - Standing Hip Abduction with Counter Support  - 1 x daily - 7 x weekly - 3 sets - 10 reps  Patient Education - Total Knee Replacement Handout  ASSESSMENT:  CLINICAL IMPRESSION: Patient continues to demonstrate good progress with recovery from LTKA with improved stair ambulation, increased LE strength, and improved gait pattern. Patient does demonstrate need for a few rest breaks after more intense exercises during today's session. Patient able to progress dynamic balance and core activation exercises today with ankle weight marches and step up variations, good performance with verbal cueing. Patient would continue to benefit from skilled physical therapy for increased endurance with ambulation, increased LLE strength, and improved balance for improved quality of life, improved independence with community ambulation and continued progress towards therapy goals.     Patient demonstrates great progress to return to baseline following LTKA, decreased LE strength, abnormal sensation of LLE, and impaired balance. Patient also demonstrates difficulty with ambulation during today's session with decreased stride length and velocity noted. Patient also demonstrates decreased L knee ROM. Patient would benefit from skilled physical therapy for increased endurance with  ambulation, increased LLE strength/ROM, and balance for improved gait quality, return to higher level of function with ADLs, and progress towards therapy goals.   OBJECTIVE IMPAIRMENTS: Abnormal gait, decreased balance, decreased endurance, decreased mobility, difficulty walking, decreased ROM, decreased strength, and impaired sensation.   ACTIVITY LIMITATIONS: carrying, lifting, bending, squatting, sleeping, stairs, transfers, and caring for others  PARTICIPATION LIMITATIONS: meal prep, cleaning, laundry, shopping, community activity, occupation, and yard work  PERSONAL FACTORS: Age, Fitness, and Time since onset of injury/illness/exacerbation are also affecting patient's functional outcome.   REHAB POTENTIAL: Good  CLINICAL DECISION MAKING: Stable/uncomplicated  EVALUATION COMPLEXITY: Low   GOALS: Goals reviewed with patient? No  SHORT TERM GOALS: Target date: 04/29/24  Patient will demonstrate evidence of independence with individualized HEP and will report compliance for at least 3 days per week for optimized progression towards remaining therapy goals. Baseline:  Goal status: INITIAL  2.  Patient will report a decrease in pain level/tightness during community ambulation by at least 2 points for improved quality of life. Baseline: see objective Goal status: INITIAL     LONG TERM GOALS: Target date: 05/20/24  Pt will demonstrate a an increase of at least 9 points on the LEFS for improved performance of community ambulation and ADL. Baseline: see objective Goal status: INITIAL  2.  Pt will demonstrate ability to  complete within age related norms for improved functional ambulatory capacity in community setting.  Baseline: not tested due to antalgic gait pattern at eval Goal status: INITIAL  3.  Pt will demonstrate WFL ROM (flexion and extension) in left knee, for increased mobility and maximal efficiency of gait cycle during ambulation. Baseline: see objective Goal  status: INITIAL  4.  Pt will demonstrate at least 4+/5 MMT for right lower extremity for increased strength during ADL and community ambulation. Baseline: see objective Goal status: INITIAL  5.  Pt will improve 5TSTS by at least 2.3 seconds in order to improve LE strength during functional activities. Baseline: see objective Goal status: INITIAL    PLAN:  PT FREQUENCY: 2x/week  PT DURATION: 6 weeks  PLANNED INTERVENTIONS: 97110-Therapeutic exercises, 97530- Therapeutic activity, 97112- Neuromuscular re-education, 97535- Self Care, 82956- Manual therapy, [phone]- Gait training, Patient/Family education, Balance training, Stair training, Joint mobilization, DME instructions, Cryotherapy, and Moist heat  PLAN FOR NEXT SESSION: Progress LE strengthening and ROM for L knee   Armond Bertin, PT, DPT [facility] Office: [phone] 2:31 PM, 04/22/24

## 2024-04-25 ENCOUNTER — Ambulatory Visit (HOSPITAL_COMMUNITY)

## 2024-04-25 ENCOUNTER — Encounter (HOSPITAL_COMMUNITY): Payer: Self-pay

## 2024-04-25 DIAGNOSIS — Z7409 Other reduced mobility: Secondary | ICD-10-CM | POA: Diagnosis not present

## 2024-04-25 DIAGNOSIS — Z01818 Encounter for other preprocedural examination: Secondary | ICD-10-CM | POA: Diagnosis not present

## 2024-04-25 DIAGNOSIS — R29898 Other symptoms and signs involving the musculoskeletal system: Secondary | ICD-10-CM | POA: Diagnosis not present

## 2024-04-25 DIAGNOSIS — M25662 Stiffness of left knee, not elsewhere classified: Secondary | ICD-10-CM

## 2024-04-25 DIAGNOSIS — M170 Bilateral primary osteoarthritis of knee: Secondary | ICD-10-CM | POA: Diagnosis not present

## 2024-04-25 NOTE — Therapy (Signed)
 OUTPATIENT PHYSICAL THERAPY LOWER EXTREMITY EVALUATION   Patient Name: Diana Skinner MRN: [id number] DOB:[birth year], 73 y.o., female Today's Date: 04/25/2024  END OF SESSION:  PT End of Session - 04/25/24 1343     Visit Number 5    Date for PT Re-Evaluation 04/29/24    Authorization Type [employer] MEDICARE    Authorization Time Period seeking auth    Authorization - Visit Number 4    Progress Note Due on Visit 10    PT Start Time 1300    PT Stop Time 1344    PT Time Calculation (min) 44 min    Activity Tolerance Patient tolerated treatment well    Behavior During Therapy [facility] for tasks assessed/performed                 Past Medical History:  Diagnosis Date   Anxiety    Arthritis    High cholesterol    HTN (hypertension)    Migraines    Past Surgical History:  Procedure Laterality Date   CATARACT EXTRACTION W/PHACO Right 01/27/2019   Procedure: CATARACT EXTRACTION PHACO AND INTRAOCULAR LENS PLACEMENT (IOC);  Surgeon: Tarri Farm, MD;  Location: [facility];  Service: Ophthalmology;  Laterality: Right;  CDE: 8.33   CATARACT EXTRACTION W/PHACO Left 02/10/2019   Procedure: CATARACT EXTRACTION PHACO AND INTRAOCULAR LENS PLACEMENT (IOC);  Surgeon: Tarri Farm, MD;  Location: [facility];  Service: Ophthalmology;  Laterality: Left;  CDE: 5.49   CESAREAN SECTION     COLONOSCOPY  06/19/2012   Procedure: COLONOSCOPY;  Surgeon: Ruby Corporal, MD;  Location: [facility];  Service: Endoscopy;  Laterality: N/A;  1030   ORIF RADIAL FRACTURE Right 08/27/2016   Procedure: OPEN REDUCTION INTERNAL FIXATION (ORIF) RADIAL FRACTURE/DISTAL;  Surgeon: Mauricia South, MD;  Location: [facility];  Service: Plastics;  Laterality: Right;   TONSILLECTOMY AND ADENOIDECTOMY     TOTAL KNEE ARTHROPLASTY Left 03/25/2024   Procedure: ARTHROPLASTY, KNEE, TOTAL;  Surgeon: Darrin Emerald, MD;  Location: [facility];  Service: Orthopedics;  Laterality: Left;   Patient Active Problem List   Diagnosis Date Noted    S/P total knee arthroplasty, left 03/25/24 04/08/2024   Primary osteoarthritis of left knee 03/25/2024   Fracture of right wrist 08/27/2016   Bursitis of knee 05/30/2011   KNEE PAIN 01/10/2010   CLOSED FRACTURE OF UPPER END OF TIBIA 01/10/2010   DERANGEMENT MENISCUS 01/03/2010    PCP: Minus Amel, MD   REFERRING PROVIDER: Darrin Emerald, MD  REFERRING DIAG: M17.0 (ICD-10-CM) - Primary osteoarthritis of both knees Z01.818 (ICD-10-CM) - Pre-op exam  THERAPY DIAG:  Decreased ROM of left knee  Weakness of left lower extremity  Impaired functional mobility, balance, gait, and endurance  Rationale for Evaluation and Treatment: Rehabilitation  ONSET DATE: 03/25/24  SUBJECTIVE:   SUBJECTIVE STATEMENT: Pt states she is still dealing with left knee stiffness, 6-7/10. Pt states that the numbness feeling on the inside of the leg is improving, can still tell that the nerves are healing.   Pt is s/p L TKA on 03/25/24. Pt has not been using the RW since the first couple of days of being home, pt has only been using cane on uneven surfaces. Pt states that she is about to have the right knee replaced when left knee is recovered. Pt states she loves the CPM machine and that she has even fallen asleep with LLE in it. Pt states she had some blood come from surgical site, Dr. Phyllis Breeze evaluated. Staples to  be taken out this Thursday.  PERTINENT HISTORY: -HTN, controlled PAIN:  Are you having pain? Yes: NPRS scale: 5/10 Pain location: left knee Pain description: stiffness Aggravating factors: tighter in the morning Relieving factors: moving helps loosening up, ice  PRECAUTIONS: None  RED FLAGS: None   WEIGHT BEARING RESTRICTIONS: No  FALLS:  Has patient fallen in last 6 months? No  LIVING ENVIRONMENT: Lives with: lives with their spouse Lives in: House/apartment Stairs: Yes: External: 5 steps; on right going up Has following equipment at home: None  OCCUPATION:  retired  PLOF: Independent  PATIENT GOALS: get back to mowing yard, would like to feel like she has a real knee, get back to cleaning, and being able to walk local trail  NEXT MD VISIT: Thursday  OBJECTIVE:  Note: Objective measures were completed at Evaluation unless otherwise noted.  DIAGNOSTIC FINDINGS:  CLINICAL DATA:  Status post left total knee arthroplasty.   EXAM: PORTABLE LEFT KNEE - 1-2 VIEW   COMPARISON:  None Available.   FINDINGS: Left knee arthroplasty in expected alignment. No periprosthetic lucency or fracture. There has been patellar resurfacing. Recent postsurgical change includes air and edema in the soft tissues and joint space. Anterior skin staples in place.   IMPRESSION: Left knee arthroplasty without immediate postoperative complication.  PATIENT SURVEYS:  LEFS 49/80  COGNITION: Overall cognitive status: Within functional limits for tasks assessed     SENSATION: Light touch: Impaired  medial malleolus on right side, felt less  EDEMA:  Left swelling some  PALPATION: Tenderness to anterior medial left knee  LOWER EXTREMITY ROM:  Active ROM Right eval Left eval  Hip flexion    Hip extension    Hip abduction    Hip adduction    Hip internal rotation    Hip external rotation    Knee flexion  100, pain   Knee extension  -4, not quite neutral  Ankle dorsiflexion    Ankle plantarflexion    Ankle inversion    Ankle eversion     (Blank rows = not tested)  LOWER EXTREMITY MMT:  MMT Right eval Left eval  Hip flexion    Hip extension    Hip abduction    Hip adduction    Hip internal rotation    Hip external rotation    Knee flexion    Knee extension    Ankle dorsiflexion    Ankle plantarflexion    Ankle inversion    Ankle eversion     (Blank rows = not tested)   FUNCTIONAL TESTS:  5 times sit to stand: 21.5 seconds  GAIT: Distance walked: 50 feet Assistive device utilized: None Level of assistance: Complete  Independence Comments: Pt presents with no AD. Slight antalgic gait pattern due to recent left knee surgery. Pt demonstrates decreased velocity and decreased right stance time.  TREATMENT DATE:  04/25/2024  Manual Therapy: -STM to L knee and corresponding musculature, desensitizing techniques  -Patellar mobilizations, grade II and III, all planes Therapeutic Exercise: -Heel raises, 2 sets of 20 reps -DKTC, 2 sets of 10 reps on green exercise ball, pt cued for slow controlled movement -TKE, 4 inch step, 3 set of 10 reps, pt cued for decreased UE support -Aeromat walks, lateral stepping/tandem walking, 3 laps, in parallel bars -Lateral stepping 2 laps 20 feet per lap, with RTB around ankles, pt cued for upright posture and decreased toe out compensation -Knee drive stretch, 10 sets of 10 second holds, 115 degrees of flexion -Bike 5 minute, seat 9, full rotations, pt demonstrates increased performance -Sit to stands to staggered stance on blue foam with yellow ball trampoline toss, 2 sets of 10 reps    04/22/2024  Therapeutic Exercise: -Supine bridges 2 sets of 10 reps, 3 second holds, pt cued for max hip extension -DKTC, 2 sets of 10 reps on green exercise ball, pt cued for slow controlled movement -Ankle weight 3#, walking marches/butt kicks, 3 laps each variation, 60 foot laps -Standing 3 way hip on floor cross, 1 sets 10 reps, LLE only, pt cued for upright trunk and maintaining of neutral spine -Lateral stepping 2 laps 20 feet per lap, with RTB around ankles, pt cued for upright posture and decreased toe out compensation -Cone touches from blue foam, 3 sets of 10 reps, bilaterally    Therapeutic Activity: -Sit to stands, 2 sets of 4 reps, throughout session -Lateral step up and overs, 8 inch step, 2 sets of 7 reps -Stair ambulation, 3 trials, pt cued for  decreased UE support  04/18/2024  Therapeutic Exercise: -Bike, 5 min, seat 11, pt cued for not completing full rotation today -Heel slides with green strap for OP, 2 sets of 15 reps, pt cued for controlled motion (115 flexion obtained) -Supine bridges 2 sets of 8 reps, 3 second holds, pt cued for max hip extension -Standing 3 way hip 2 sets 5 reps, bilaterally, pt cued for upright trunk and maintaining of neutral spine -Aeromat walks tandem/lateral steps, 1 laps each variation, in parallel bars, pt cued for decreased UE support and upright posture -Cone touches while standing on blue foam, 2 sets of 10 reps bilaterally, pt cued for decreased UE support -Cone touches while standing on blue foam, 1 sets of 5 reps bilaterally, pt cued for decreased UE support -TKE, 4 inch step, 2 sets of 8 reps, pt cued for decreased WB on nonworking LE, pt uses one UE support -Knee drive stretch, 10 second holds, 8 reps -Calf stretch on incline, 2 reps, 30 second holds  Therapeutic Activity: -Sit to stands to SLS on LLE with TTWB on RLE yellow ball trampoline toss, 2 sets of 5 reps -Walking marches, 1 laps, 40 feet lap, pt cued for increased left knee flexion and left hip flexion -Lateral step up and overs, 2 sets of 8 reps, pt cued for no UE support the second set, 4 inch step         PATIENT EDUCATION:  Education details: Pt was educated on findings of PT evaluation, prognosis, frequency of therapy visits and rationale, attendance policy, and HEP if given.   Person educated: Patient Education method: Explanation, Verbal cues, and Handouts Education comprehension: verbalized understanding, verbal cues required, and needs further education  HOME EXERCISE PROGRAM: Access Code: [email or web address] URL: [email or web address] Date: 04/08/2024 Prepared by: Armond Bertin  Exercises - Supine Heel Slide with Strap  -  1 x daily - 3 sets - 15-20 reps - 3 sec hold - Seated Long Arc Quad  - 1 x daily - 1  sets - 10 reps - 5s hold - Active Straight Leg Raise with Quad Set  - 1 x daily - 7 x weekly - 3 sets - 10 reps - Long Sitting Quad Set with Towel Roll Under Heel  - 1 x daily - 7 x weekly - 3 sets - 10 reps - Standing Hip Abduction with Counter Support  - 1 x daily - 7 x weekly - 3 sets - 10 reps  Patient Education - Total Knee Replacement Handout  ASSESSMENT:  CLINICAL IMPRESSION: Patient continues to demonstrate deficits in LLE strength, decreased gait quality and balance. Manual therapy was progressed to incision site and to quadriceps, hamstring, and calf musculature. Patellar mobs are not symptomatic and no restrictions observed. Patient able to progress dynamic balance and core activation exercises today with aero mat walks and STS variation, good performance with verbal cueing. Patient would continue to benefit from skilled physical therapy for decreased left knee "tightness", increased endurance with ambulation, increased LE strength, and improved balance for improved quality of life, improved community ambulation and continued progress towards therapy goals.    Patient demonstrates great progress to return to baseline following LTKA, decreased LE strength, abnormal sensation of LLE, and impaired balance. Patient also demonstrates difficulty with ambulation during today's session with decreased stride length and velocity noted. Patient also demonstrates decreased L knee ROM. Patient would benefit from skilled physical therapy for increased endurance with ambulation, increased LLE strength/ROM, and balance for improved gait quality, return to higher level of function with ADLs, and progress towards therapy goals.   OBJECTIVE IMPAIRMENTS: Abnormal gait, decreased balance, decreased endurance, decreased mobility, difficulty walking, decreased ROM, decreased strength, and impaired sensation.   ACTIVITY LIMITATIONS: carrying, lifting, bending, squatting, sleeping, stairs, transfers, and  caring for others  PARTICIPATION LIMITATIONS: meal prep, cleaning, laundry, shopping, community activity, occupation, and yard work  PERSONAL FACTORS: Age, Fitness, and Time since onset of injury/illness/exacerbation are also affecting patient's functional outcome.   REHAB POTENTIAL: Good  CLINICAL DECISION MAKING: Stable/uncomplicated  EVALUATION COMPLEXITY: Low   GOALS: Goals reviewed with patient? No  SHORT TERM GOALS: Target date: 04/29/24  Patient will demonstrate evidence of independence with individualized HEP and will report compliance for at least 3 days per week for optimized progression towards remaining therapy goals. Baseline:  Goal status: INITIAL  2.  Patient will report a decrease in pain level/tightness during community ambulation by at least 2 points for improved quality of life. Baseline: see objective Goal status: INITIAL     LONG TERM GOALS: Target date: 05/20/24  Pt will demonstrate a an increase of at least 9 points on the LEFS for improved performance of community ambulation and ADL. Baseline: see objective Goal status: INITIAL  2.  Pt will demonstrate ability to complete within age related norms for improved functional ambulatory capacity in community setting.  Baseline: not tested due to antalgic gait pattern at eval Goal status: INITIAL  3.  Pt will demonstrate WFL ROM (flexion and extension) in left knee, for increased mobility and maximal efficiency of gait cycle during ambulation. Baseline: see objective Goal status: INITIAL  4.  Pt will demonstrate at least 4+/5 MMT for right lower extremity for increased strength during ADL and community ambulation. Baseline: see objective Goal status: INITIAL  5.  Pt will improve 5TSTS by at least 2.3 seconds in  order to improve LE strength during functional activities. Baseline: see objective Goal status: INITIAL    PLAN:  PT FREQUENCY: 2x/week  PT DURATION: 6 weeks  PLANNED  INTERVENTIONS: 97110-Therapeutic exercises, 97530- Therapeutic activity, [id number]- Neuromuscular re-education, 97535- Self Care, 96045- Manual therapy, [id number]- Gait training, Patient/Family education, Balance training, Stair training, Joint mobilization, DME instructions, Cryotherapy, and Moist heat  PLAN FOR NEXT SESSION: Progress LE strengthening and ROM for L knee.   Armond Bertin, PT, DPT [facility] Office: [phone] 2:14 PM, 04/25/24

## 2024-04-29 ENCOUNTER — Ambulatory Visit (HOSPITAL_COMMUNITY)

## 2024-04-29 ENCOUNTER — Encounter (HOSPITAL_COMMUNITY): Payer: Self-pay

## 2024-04-29 DIAGNOSIS — M25662 Stiffness of left knee, not elsewhere classified: Secondary | ICD-10-CM | POA: Diagnosis not present

## 2024-04-29 DIAGNOSIS — M170 Bilateral primary osteoarthritis of knee: Secondary | ICD-10-CM | POA: Diagnosis not present

## 2024-04-29 DIAGNOSIS — R29898 Other symptoms and signs involving the musculoskeletal system: Secondary | ICD-10-CM

## 2024-04-29 DIAGNOSIS — Z01818 Encounter for other preprocedural examination: Secondary | ICD-10-CM | POA: Diagnosis not present

## 2024-04-29 DIAGNOSIS — Z7409 Other reduced mobility: Secondary | ICD-10-CM

## 2024-04-29 NOTE — Therapy (Addendum)
 OUTPATIENT PHYSICAL THERAPY LOWER EXTREMITY EVALUATION   Patient Name: Diana Skinner MRN: [id number] DOB:[birth year], 73 y.o., female Today's Date: 05/06/2024  END OF SESSION:         Past Medical History:  Diagnosis Date   Anxiety    Arthritis    High cholesterol    HTN (hypertension)    Migraines    Past Surgical History:  Procedure Laterality Date   CATARACT EXTRACTION W/PHACO Right 01/27/2019   Procedure: CATARACT EXTRACTION PHACO AND INTRAOCULAR LENS PLACEMENT (IOC);  Surgeon: Tarri Farm, MD;  Location: [facility];  Service: Ophthalmology;  Laterality: Right;  CDE: 8.33   CATARACT EXTRACTION W/PHACO Left 02/10/2019   Procedure: CATARACT EXTRACTION PHACO AND INTRAOCULAR LENS PLACEMENT (IOC);  Surgeon: Tarri Farm, MD;  Location: [facility];  Service: Ophthalmology;  Laterality: Left;  CDE: 5.49   CESAREAN SECTION     COLONOSCOPY  06/19/2012   Procedure: COLONOSCOPY;  Surgeon: Ruby Corporal, MD;  Location: [facility];  Service: Endoscopy;  Laterality: N/A;  1030   ORIF RADIAL FRACTURE Right 08/27/2016   Procedure: OPEN REDUCTION INTERNAL FIXATION (ORIF) RADIAL FRACTURE/DISTAL;  Surgeon: Mauricia South, MD;  Location: [facility];  Service: Plastics;  Laterality: Right;   TONSILLECTOMY AND ADENOIDECTOMY     TOTAL KNEE ARTHROPLASTY Left 03/25/2024   Procedure: ARTHROPLASTY, KNEE, TOTAL;  Surgeon: Darrin Emerald, MD;  Location: [facility];  Service: Orthopedics;  Laterality: Left;   Patient Active Problem List   Diagnosis Date Noted   S/P total knee arthroplasty, left 03/25/24 04/08/2024   Primary osteoarthritis of left knee 03/25/2024   Fracture of right wrist 08/27/2016   Bursitis of knee 05/30/2011   KNEE PAIN 01/10/2010   CLOSED FRACTURE OF UPPER END OF TIBIA 01/10/2010   DERANGEMENT MENISCUS 01/03/2010    PCP: Minus Amel, MD   REFERRING PROVIDER: Darrin Emerald, MD  REFERRING DIAG: M17.0 (ICD-10-CM) - Primary osteoarthritis of both knees Z01.818 (ICD-10-CM)  - Pre-op exam  THERAPY DIAG:  Decreased ROM of left knee  Weakness of left lower extremity  Impaired functional mobility, balance, gait, and endurance  Rationale for Evaluation and Treatment: Rehabilitation  ONSET DATE: 03/25/24  SUBJECTIVE:   SUBJECTIVE STATEMENT: Pt states she was really sore following last session on the posterior side of knee. Pt reports she feels she is about 75% better since the start of therapy. Pt states her knee is feeling much better 0/10 pain and even the tightness/stiffness has been getting better as well. Pt states she would like to continue with therapy to return to [facility].   Pt is s/p L TKA on 03/25/24. Pt has not been using the RW since the first couple of days of being home, pt has only been using cane on uneven surfaces. Pt states that she is about to have the right knee replaced when left knee is recovered. Pt states she loves the CPM machine and that she has even fallen asleep with LLE in it. Pt states she had some blood come from surgical site, Dr. Phyllis Breeze evaluated. Staples to be taken out this Thursday.  PERTINENT HISTORY: -HTN, controlled PAIN:  Are you having pain? Yes: NPRS scale: 5/10 Pain location: left knee Pain description: stiffness Aggravating factors: tighter in the morning Relieving factors: moving helps loosening up, ice  PRECAUTIONS: None  RED FLAGS: None   WEIGHT BEARING RESTRICTIONS: No  FALLS:  Has patient fallen in last 6 months? No  LIVING ENVIRONMENT: Lives with: lives with their spouse Lives in: House/apartment  Stairs: Yes: External: 5 steps; on right going up Has following equipment at home: None  OCCUPATION: retired  PLOF: Independent  PATIENT GOALS: get back to mowing yard, would like to feel like she has a real knee, get back to cleaning, and being able to walk local trail  NEXT MD VISIT: Thursday  OBJECTIVE:  Note: Objective measures were completed at Evaluation unless otherwise noted.  DIAGNOSTIC  FINDINGS:  CLINICAL DATA:  Status post left total knee arthroplasty.   EXAM: PORTABLE LEFT KNEE - 1-2 VIEW   COMPARISON:  None Available.   FINDINGS: Left knee arthroplasty in expected alignment. No periprosthetic lucency or fracture. There has been patellar resurfacing. Recent postsurgical change includes air and edema in the soft tissues and joint space. Anterior skin staples in place.   IMPRESSION: Left knee arthroplasty without immediate postoperative complication.  PATIENT SURVEYS:  LEFS 49/80 04/29/24: LEFS: 67/80    COGNITION: Overall cognitive status: Within functional limits for tasks assessed     SENSATION: Light touch: Impaired  medial malleolus on right side, felt less  EDEMA:  Left swelling some  PALPATION: Tenderness to anterior medial left knee  LOWER EXTREMITY ROM:  Active ROM Right eval Left eval Left 04/29/24  Hip flexion     Hip extension     Hip abduction     Hip adduction     Hip internal rotation     Hip external rotation     Knee flexion  100, pain  124 degrees, little discomfort, tight  Knee extension  -4, not quite neutral Neutral, knee flat to table  Ankle dorsiflexion     Ankle plantarflexion     Ankle inversion     Ankle eversion      (Blank rows = not tested)  LOWER EXTREMITY MMT:  MMT Right 04/19/24 Left 04/19/24  Hip flexion 4+ 4+  Hip extension 4+ 4+  Hip abduction 4+ 4+  Hip adduction 4+ 4+  Hip internal rotation    Hip external rotation    Knee flexion 4+ 4-  Knee extension 4+ 4-  Ankle dorsiflexion 4+ 4+  Ankle plantarflexion    Ankle inversion    Ankle eversion     (Blank rows = not tested)   FUNCTIONAL TESTS:  5 times sit to stand: 21.5 seconds 5TSTS: 12.91 seconds  GAIT: Distance walked: 50 feet Assistive device utilized: None Level of assistance: Complete Independence Comments: Pt presents with no AD. Slight antalgic gait pattern due to recent left knee surgery. Pt demonstrates decreased velocity and  decreased right stance time.                                                                                                                                TREATMENT DATE:  04/29/2024  Progress note:  , 5TSTS, ROM measured, strength assessed, updated HEP, goals tracked Therapeutic Exercise: -Heel slides, 2 sets of 10 reps -Bike 5 minute, seat 9, full rotations,  pt demonstrates increased performance -Sit to stands 2 sets of 5 reps throughout session    04/25/2024  Manual Therapy: -STM to L knee and corresponding musculature, desensitizing techniques  -Patellar mobilizations, grade II and III, all planes Therapeutic Exercise: -Heel raises, 2 sets of 20 reps -DKTC, 2 sets of 10 reps on green exercise ball, pt cued for slow controlled movement -TKE, 4 inch step, 3 set of 10 reps, pt cued for decreased UE support -Aeromat walks, lateral stepping/tandem walking, 3 laps, in parallel bars -Lateral stepping 2 laps 20 feet per lap, with RTB around ankles, pt cued for upright posture and decreased toe out compensation -Knee drive stretch, 10 sets of 10 second holds, 115 degrees of flexion -Bike 5 minute, seat 9, full rotations, pt demonstrates increased performance -Sit to stands to staggered stance on blue foam with yellow ball trampoline toss, 2 sets of 10 reps    04/22/2024  Therapeutic Exercise: -Supine bridges 2 sets of 10 reps, 3 second holds, pt cued for max hip extension -DKTC, 2 sets of 10 reps on green exercise ball, pt cued for slow controlled movement -Ankle weight 3#, walking marches/butt kicks, 3 laps each variation, 60 foot laps -Standing 3 way hip on floor cross, 1 sets 10 reps, LLE only, pt cued for upright trunk and maintaining of neutral spine -Lateral stepping 2 laps 20 feet per lap, with RTB around ankles, pt cued for upright posture and decreased toe out compensation -Cone touches from blue foam, 3 sets of 10 reps, bilaterally    Therapeutic Activity: -Sit to  stands, 2 sets of 4 reps, throughout session -Lateral step up and overs, 8 inch step, 2 sets of 7 reps -Stair ambulation, 3 trials, pt cued for decreased UE support      PATIENT EDUCATION:  Education details: Pt was educated on findings of PT evaluation, prognosis, frequency of therapy visits and rationale, attendance policy, and HEP if given.   Person educated: Patient Education method: Explanation, Verbal cues, and Handouts Education comprehension: verbalized understanding, verbal cues required, and needs further education  HOME EXERCISE PROGRAM: Access Code: [email or web address] URL: [email or web address] Date: 04/08/2024 Prepared by: Armond Bertin  Exercises - Supine Heel Slide with Strap  - 1 x daily - 3 sets - 15-20 reps - 3 sec hold - Seated Long Arc Quad  - 1 x daily - 1 sets - 10 reps - 5s hold - Active Straight Leg Raise with Quad Set  - 1 x daily - 7 x weekly - 3 sets - 10 reps - Long Sitting Quad Set with Towel Roll Under Heel  - 1 x daily - 7 x weekly - 3 sets - 10 reps - Standing Hip Abduction with Counter Support  - 1 x daily - 7 x weekly - 3 sets - 10 reps  Patient Education - Total Knee Replacement Handout  Access Code: [email or web address] URL: [email or web address] Date: 04/29/2024 Prepared by: Armond Bertin  Exercises - Sit to Stand with Arms Crossed  - 1 x daily - 7 x weekly - 3 sets - 10 reps - Standing March with Counter Support  - 1 x daily - 7 x weekly - 3 sets - 10 reps - Heel Raises with Counter Support  - 1 x daily - 7 x weekly - 3 sets - 10 reps - Step Up  - 1 x daily - 7 x weekly - 3 sets - 10 reps  ASSESSMENT:  CLINICAL IMPRESSION: Progress note 04/29/24: Patient has demonstrated great progress towards  prior level of function. Pt has also met 6/7 therapy goals set at the initial visit. Pt continues to demonstrate decreased LLE strength compared to the RLE during manual muscle testing today. Pts appointments regressed to 1 per week for  remaining therapy episode due to progress of HEP. Pt able to demonstrate ability to complete the with no increased knee pain at this date, SOB noted. Patient would continue to benefit from skilled physical therapy for increased endurance with ambulation, increased LLE strength, and improved balance for improved quality of life, improved community ambulation and continued progress towards therapy goals.     Patient demonstrates great progress to return to baseline following LTKA, decreased LE strength, abnormal sensation of LLE, and impaired balance. Patient also demonstrates difficulty with ambulation during today's session with decreased stride length and velocity noted. Patient also demonstrates decreased L knee ROM. Patient would benefit from skilled physical therapy for increased endurance with ambulation, increased LLE strength/ROM, and balance for improved gait quality, return to higher level of function with ADLs, and progress towards therapy goals.   OBJECTIVE IMPAIRMENTS: Abnormal gait, decreased balance, decreased endurance, decreased mobility, difficulty walking, decreased ROM, decreased strength, and impaired sensation.   ACTIVITY LIMITATIONS: carrying, lifting, bending, squatting, sleeping, stairs, transfers, and caring for others  PARTICIPATION LIMITATIONS: meal prep, cleaning, laundry, shopping, community activity, occupation, and yard work  PERSONAL FACTORS: Age, Fitness, and Time since onset of injury/illness/exacerbation are also affecting patient's functional outcome.   REHAB POTENTIAL: Good  CLINICAL DECISION MAKING: Stable/uncomplicated  EVALUATION COMPLEXITY: Low   GOALS: Goals reviewed with patient? No  SHORT TERM GOALS: Target date: 04/29/24  Patient will demonstrate evidence of independence with individualized HEP and will report compliance for at least 3 days per week for optimized progression towards remaining therapy goals. Baseline:  Goal status:  MET  2.  Patient will report a decrease in pain level/tightness during community ambulation by at least 2 points for improved quality of life. Baseline: see objective Goal status: MET     LONG TERM GOALS: Target date: 05/20/24  Pt will demonstrate a an increase of at least 9 points on the LEFS for improved performance of community ambulation and ADL. Baseline: see objective Goal status: MET  2.  Pt will demonstrate ability to complete within age related norms for improved functional ambulatory capacity in community setting.  Baseline: not tested due to antalgic gait pattern at eval, 566 Goal status: MET  3.  Pt will demonstrate WFL ROM (flexion and extension) in left knee, for increased mobility and maximal efficiency of gait cycle during ambulation. Baseline: see objective Goal status: MET  4.  Pt will demonstrate at least 4+/5 MMT for right lower extremity for increased strength during ADL and community ambulation. Baseline: see objective Goal status: IN PROGRESS  5.  Pt will improve 5TSTS by at least 2.3 seconds in order to improve LE strength during functional activities. Baseline: see objective Goal status: MET    PLAN:  PT FREQUENCY: 2x/week  PT DURATION: 6 weeks  PLANNED INTERVENTIONS: 97110-Therapeutic exercises, 97530- Therapeutic activity, [id number]- Neuromuscular re-education, 97535- Self Care, [id number]- Manual therapy, [phone]- Gait training, Patient/Family education, Balance training, Stair training, Joint mobilization, DME instructions, Cryotherapy, and Moist heat  PLAN FOR NEXT SESSION: Progress LE strengthening and ROM for L knee. Progress HEP to pt toleration. Continue to encourage walking program. Inform about insurance only covering 6 visits.   Seeking 3 more week at 1 visit per week for return to [facility].  Aastha Dayley  Chester Costa, DPT [facility]: [phone] 3:06 PM, 05/06/24  [facility] Medicare Auth Request Information  Date of  referral: 03/07/24 Referring provider: Darrin Emerald, MD Referring diagnosis (ICD 10)? M17.0 (ICD-10-CM) - Primary osteoarthritis of both knees Z01.818 (ICD-10-CM) - Pre-op exam Treatment diagnosis (ICD 10)? (if different than referring diagnosis) M25.662 ; R29.898 ; Z74.09   Functional Tool Score: LEFS: 67/80   What was this (referring dx) caused by? Surgery (Type: TKA)  Lonne Roan of Condition: Initial Onset (within last 3 months)   Laterality: Lt  Current Functional Measure Score: LEFS 67/80  Objective measurements identify impairments when they are compared to normal values, the uninvolved extremity, and prior level of function.  [x]  Yes  []  No  Objective assessment of functional ability: Minimal functional limitations   Briefly describe symptoms: pt still limited due to strength of left knee compared to the right, unsymmetrical gait leading to compensations  How did symptoms start: surgery  Average pain intensity:  Last 24 hours: 2/10  Past week: 2/10  How often does the pt experience symptoms? Frequently  How much have the symptoms interfered with usual daily activities? Moderately  How has condition changed since care began at this facility? Better  In general, how is the patients overall health? Good   BACK PAIN (STarT Back Screening Tool) No

## 2024-05-02 ENCOUNTER — Encounter (HOSPITAL_COMMUNITY)

## 2024-05-06 ENCOUNTER — Ambulatory Visit (HOSPITAL_COMMUNITY): Attending: Orthopedic Surgery

## 2024-05-06 ENCOUNTER — Encounter (HOSPITAL_COMMUNITY): Payer: Self-pay

## 2024-05-06 DIAGNOSIS — Z7409 Other reduced mobility: Secondary | ICD-10-CM | POA: Insufficient documentation

## 2024-05-06 DIAGNOSIS — R29898 Other symptoms and signs involving the musculoskeletal system: Secondary | ICD-10-CM | POA: Insufficient documentation

## 2024-05-06 DIAGNOSIS — M25662 Stiffness of left knee, not elsewhere classified: Secondary | ICD-10-CM | POA: Diagnosis not present

## 2024-05-06 NOTE — Therapy (Signed)
 OUTPATIENT PHYSICAL THERAPY LOWER EXTREMITY TREATMENT   Patient Name: Diana Skinner MRN: [id number] DOB:Dec 09, 1950, 73 y.o., female Today's Date: 05/06/2024  END OF SESSION:  PT End of Session - 05/06/24 1301     Visit Number 7    Number of Visits 12    Date for PT Re-Evaluation 05/20/24    Authorization Type [employer] MEDICARE    Authorization Time Period seeking auth; requested additional treatments on 5/27 visit #6    Authorization - Visit Number 6    Progress Note Due on Visit 10    PT Start Time 1301    PT Stop Time 1343    PT Time Calculation (min) 42 min    Activity Tolerance Patient tolerated treatment well    Behavior During Therapy [facility] for tasks assessed/performed                  Past Medical History:  Diagnosis Date   Anxiety    Arthritis    High cholesterol    HTN (hypertension)    Migraines    Past Surgical History:  Procedure Laterality Date   CATARACT EXTRACTION W/PHACO Right 01/27/2019   Procedure: CATARACT EXTRACTION PHACO AND INTRAOCULAR LENS PLACEMENT (IOC);  Surgeon: Tarri Farm, MD;  Location: [facility];  Service: Ophthalmology;  Laterality: Right;  CDE: 8.33   CATARACT EXTRACTION W/PHACO Left 02/10/2019   Procedure: CATARACT EXTRACTION PHACO AND INTRAOCULAR LENS PLACEMENT (IOC);  Surgeon: Tarri Farm, MD;  Location: [facility];  Service: Ophthalmology;  Laterality: Left;  CDE: 5.49   CESAREAN SECTION     COLONOSCOPY  06/19/2012   Procedure: COLONOSCOPY;  Surgeon: Ruby Corporal, MD;  Location: [facility];  Service: Endoscopy;  Laterality: N/A;  1030   ORIF RADIAL FRACTURE Right 08/27/2016   Procedure: OPEN REDUCTION INTERNAL FIXATION (ORIF) RADIAL FRACTURE/DISTAL;  Surgeon: Mauricia South, MD;  Location: [facility];  Service: Plastics;  Laterality: Right;   TONSILLECTOMY AND ADENOIDECTOMY     TOTAL KNEE ARTHROPLASTY Left 03/25/2024   Procedure: ARTHROPLASTY, KNEE, TOTAL;  Surgeon: Darrin Emerald, MD;  Location: [facility];  Service: Orthopedics;   Laterality: Left;   Patient Active Problem List   Diagnosis Date Noted   S/P total knee arthroplasty, left 03/25/24 04/08/2024   Primary osteoarthritis of left knee 03/25/2024   Fracture of right wrist 08/27/2016   Bursitis of knee 05/30/2011   KNEE PAIN 01/10/2010   CLOSED FRACTURE OF UPPER END OF TIBIA 01/10/2010   DERANGEMENT MENISCUS 01/03/2010    PCP: Minus Amel, MD   REFERRING PROVIDER: Darrin Emerald, MD  REFERRING DIAG: M17.0 (ICD-10-CM) - Primary osteoarthritis of both knees Z01.818 (ICD-10-CM) - Pre-op exam  THERAPY DIAG:  Decreased ROM of left knee  Weakness of left lower extremity  Impaired functional mobility, balance, gait, and endurance  Rationale for Evaluation and Treatment: Rehabilitation  ONSET DATE: 03/25/24  SUBJECTIVE:   SUBJECTIVE STATEMENT: Pt stated she is feeling good today, no reports of pain, just some tightness on knee.     Pt is s/p L TKA on 03/25/24. Pt has not been using the RW since the first couple of days of being home, pt has only been using cane on uneven surfaces. Pt states that she is about to have the right knee replaced when left knee is recovered. Pt states she loves the CPM machine and that she has even fallen asleep with LLE in it. Pt states she had some blood come from surgical site, Dr. Phyllis Breeze evaluated. Staples to  be taken out this Thursday.  PERTINENT HISTORY: -HTN, controlled PAIN:  Are you having pain? Yes: NPRS scale: 5/10 Pain location: left knee Pain description: stiffness Aggravating factors: tighter in the morning Relieving factors: moving helps loosening up, ice  PRECAUTIONS: None  RED FLAGS: None   WEIGHT BEARING RESTRICTIONS: No  FALLS:  Has patient fallen in last 6 months? No  LIVING ENVIRONMENT: Lives with: lives with their spouse Lives in: House/apartment Stairs: Yes: External: 5 steps; on right going up Has following equipment at home: None  OCCUPATION: retired  PLOF:  Independent  PATIENT GOALS: get back to mowing yard, would like to feel like she has a real knee, get back to cleaning, and being able to walk local trail  NEXT MD VISIT:  June 12th, 2025  OBJECTIVE:  Note: Objective measures were completed at Evaluation unless otherwise noted.  DIAGNOSTIC FINDINGS:  CLINICAL DATA:  Status post left total knee arthroplasty.   EXAM: PORTABLE LEFT KNEE - 1-2 VIEW   COMPARISON:  None Available.   FINDINGS: Left knee arthroplasty in expected alignment. No periprosthetic lucency or fracture. There has been patellar resurfacing. Recent postsurgical change includes air and edema in the soft tissues and joint space. Anterior skin staples in place.   IMPRESSION: Left knee arthroplasty without immediate postoperative complication.  PATIENT SURVEYS:  LEFS 49/80 04/29/24: LEFS: 67/80    COGNITION: Overall cognitive status: Within functional limits for tasks assessed     SENSATION: Light touch: Impaired  medial malleolus on right side, felt less  EDEMA:  Left swelling some  PALPATION: Tenderness to anterior medial left knee  LOWER EXTREMITY ROM:  Active ROM Right eval Left eval Left 04/29/24  Hip flexion     Hip extension     Hip abduction     Hip adduction     Hip internal rotation     Hip external rotation     Knee flexion  100, pain  124 degrees, little discomfort, tight  Knee extension  -4, not quite neutral Neutral, knee flat to table  Ankle dorsiflexion     Ankle plantarflexion     Ankle inversion     Ankle eversion      (Blank rows = not tested)  LOWER EXTREMITY MMT:  MMT Right 04/19/24 Left 04/19/24  Hip flexion 4+ 4+  Hip extension 4+ 4+  Hip abduction 4+ 4+  Hip adduction 4+ 4+  Hip internal rotation    Hip external rotation    Knee flexion 4+ 4-  Knee extension 4+ 4-  Ankle dorsiflexion 4+ 4+  Ankle plantarflexion    Ankle inversion    Ankle eversion     (Blank rows = not tested)   FUNCTIONAL TESTS:  5  times sit to stand: 21.5 seconds 5TSTS: 12.91 seconds  GAIT: Distance walked: 50 feet Assistive device utilized: None Level of assistance: Complete Independence Comments: Pt presents with no AD. Slight antalgic gait pattern due to recent left knee surgery. Pt demonstrates decreased velocity and decreased right stance time.  TREATMENT DATE:  05/06/24: - Bike 5 minute, seat 9, full rotations - Squat to heel raises 10x - SLS 8-9" max BLE - Vector stance 3x5" with 1 HHA - 4in Lateral step up 10x - 4in step down 20x  - Bodycraft 3Pl Retro and sidestep 3RT each - Leg press 4Pl 3x 10 - Slant board 3x 30"  04/29/2024  Progress note:  , 5TSTS, ROM measured, strength assessed, updated HEP, goals tracked Therapeutic Exercise: -Heel slides, 2 sets of 10 reps -Bike 5 minute, seat 9, full rotations, pt demonstrates increased performance -Sit to stands 2 sets of 5 reps throughout session    04/25/2024  Manual Therapy: -STM to L knee and corresponding musculature, desensitizing techniques  -Patellar mobilizations, grade II and III, all planes Therapeutic Exercise: -Heel raises, 2 sets of 20 reps -DKTC, 2 sets of 10 reps on green exercise ball, pt cued for slow controlled movement -TKE, 4 inch step, 3 set of 10 reps, pt cued for decreased UE support -Aeromat walks, lateral stepping/tandem walking, 3 laps, in parallel bars -Lateral stepping 2 laps 20 feet per lap, with RTB around ankles, pt cued for upright posture and decreased toe out compensation -Knee drive stretch, 10 sets of 10 second holds, 115 degrees of flexion -Bike 5 minute, seat 9, full rotations, pt demonstrates increased performance -Sit to stands to staggered stance on blue foam with yellow ball trampoline toss, 2 sets of 10 reps    04/22/2024  Therapeutic Exercise: -Supine bridges 2 sets of 10  reps, 3 second holds, pt cued for max hip extension -DKTC, 2 sets of 10 reps on green exercise ball, pt cued for slow controlled movement -Ankle weight 3#, walking marches/butt kicks, 3 laps each variation, 60 foot laps -Standing 3 way hip on floor cross, 1 sets 10 reps, LLE only, pt cued for upright trunk and maintaining of neutral spine -Lateral stepping 2 laps 20 feet per lap, with RTB around ankles, pt cued for upright posture and decreased toe out compensation -Cone touches from blue foam, 3 sets of 10 reps, bilaterally    Therapeutic Activity: -Sit to stands, 2 sets of 4 reps, throughout session -Lateral step up and overs, 8 inch step, 2 sets of 7 reps -Stair ambulation, 3 trials, pt cued for decreased UE support      PATIENT EDUCATION:  Education details: Pt was educated on findings of PT evaluation, prognosis, frequency of therapy visits and rationale, attendance policy, and HEP if given.   Person educated: Patient Education method: Explanation, Verbal cues, and Handouts Education comprehension: verbalized understanding, verbal cues required, and needs further education  HOME EXERCISE PROGRAM: Access Code: [email or web address] URL: [email or web address] Date: 04/08/2024 Prepared by: Armond Bertin  Exercises - Supine Heel Slide with Strap  - 1 x daily - 3 sets - 15-20 reps - 3 sec hold - Seated Long Arc Quad  - 1 x daily - 1 sets - 10 reps - 5s hold - Active Straight Leg Raise with Quad Set  - 1 x daily - 7 x weekly - 3 sets - 10 reps - Long Sitting Quad Set with Towel Roll Under Heel  - 1 x daily - 7 x weekly - 3 sets - 10 reps - Standing Hip Abduction with Counter Support  - 1 x daily - 7 x weekly - 3 sets - 10 reps  Patient Education - Total Knee Replacement Handout  Access Code: [email or web address] URL: [email or web address] Date: 04/29/2024 Prepared by: Armond Bertin  Exercises - Sit to  Stand with Arms Crossed  - 1 x daily - 7 x weekly - 3 sets - 10 reps -  Standing March with Counter Support  - 1 x daily - 7 x weekly - 3 sets - 10 reps - Heel Raises with Counter Support  - 1 x daily - 7 x weekly - 3 sets - 10 reps - Step Up  - 1 x daily - 7 x weekly - 3 sets - 10 reps  05/06/24: - Single Leg Stance  - 2 x daily - 7 x weekly - 1 sets - 3 reps - 30" hold - Standing 3-Way Kick  - 2 x daily - 7 x weekly - 1 sets - 3 reps  ASSESSMENT:  CLINICAL IMPRESSION: 05/06/24: Session focus with functional strengthening and mobility.  Added stabilty exercises that was challenging with SLS based exercises.  Added dynamic gait exercises with additional bodycraft walkout that was tolerated well.  No reports of pain through session and able to demonstrate good mechanics following initial cueing for functional strengthening step training.  Added SLS and vector stance to HEP with printout given and verbalized understanding.   Patient demonstrates great progress to return to baseline following LTKA, decreased LE strength, abnormal sensation of LLE, and impaired balance. Patient also demonstrates difficulty with ambulation during today's session with decreased stride length and velocity noted. Patient also demonstrates decreased L knee ROM. Patient would benefit from skilled physical therapy for increased endurance with ambulation, increased LLE strength/ROM, and balance for improved gait quality, return to higher level of function with ADLs, and progress towards therapy goals.   OBJECTIVE IMPAIRMENTS: Abnormal gait, decreased balance, decreased endurance, decreased mobility, difficulty walking, decreased ROM, decreased strength, and impaired sensation.   ACTIVITY LIMITATIONS: carrying, lifting, bending, squatting, sleeping, stairs, transfers, and caring for others  PARTICIPATION LIMITATIONS: meal prep, cleaning, laundry, shopping, community activity, occupation, and yard work  PERSONAL FACTORS: Age, Fitness, and Time since onset of injury/illness/exacerbation are also  affecting patient's functional outcome.   REHAB POTENTIAL: Good  CLINICAL DECISION MAKING: Stable/uncomplicated  EVALUATION COMPLEXITY: Low   GOALS: Goals reviewed with patient? No  SHORT TERM GOALS: Target date: 04/29/24  Patient will demonstrate evidence of independence with individualized HEP and will report compliance for at least 3 days per week for optimized progression towards remaining therapy goals. Baseline:  Goal status: MET  2.  Patient will report a decrease in pain level/tightness during community ambulation by at least 2 points for improved quality of life. Baseline: see objective Goal status: MET     LONG TERM GOALS: Target date: 05/20/24  Pt will demonstrate a an increase of at least 9 points on the LEFS for improved performance of community ambulation and ADL. Baseline: see objective Goal status: MET  2.  Pt will demonstrate ability to complete within age related norms for improved functional ambulatory capacity in community setting.  Baseline: not tested due to antalgic gait pattern at eval, 566 Goal status: MET  3.  Pt will demonstrate WFL ROM (flexion and extension) in left knee, for increased mobility and maximal efficiency of gait cycle during ambulation. Baseline: see objective Goal status: MET  4.  Pt will demonstrate at least 4+/5 MMT for right lower extremity for increased strength during ADL and community ambulation. Baseline: see objective Goal status: IN PROGRESS  5.  Pt will improve 5TSTS by at least 2.3 seconds in order to improve LE strength during functional activities. Baseline: see objective Goal status: MET  PLAN:  PT FREQUENCY: 2x/week  PT DURATION: 6 weeks  PLANNED INTERVENTIONS: 97110-Therapeutic exercises, 97530- Therapeutic activity, [id number]- Neuromuscular re-education, 97535- Self Care, [id number]- Manual therapy, [phone]- Gait training, Patient/Family education, Balance training, Stair training, Joint mobilization, DME  instructions, Cryotherapy, and Moist heat  PLAN FOR NEXT SESSION: Progress LE strengthening and ROM for L knee. Progress HEP to pt toleration. Continue to encourage walking program.   Minor Amble, LPTA/CLT; CBIS [phone]  4:50 PM, 05/06/24

## 2024-05-08 ENCOUNTER — Encounter: Admitting: Orthopedic Surgery

## 2024-05-09 ENCOUNTER — Encounter (HOSPITAL_COMMUNITY)

## 2024-05-13 ENCOUNTER — Ambulatory Visit (HOSPITAL_COMMUNITY)

## 2024-05-13 ENCOUNTER — Encounter (HOSPITAL_COMMUNITY): Payer: Self-pay

## 2024-05-13 DIAGNOSIS — R29898 Other symptoms and signs involving the musculoskeletal system: Secondary | ICD-10-CM

## 2024-05-13 DIAGNOSIS — M25662 Stiffness of left knee, not elsewhere classified: Secondary | ICD-10-CM | POA: Diagnosis not present

## 2024-05-13 DIAGNOSIS — Z7409 Other reduced mobility: Secondary | ICD-10-CM

## 2024-05-13 NOTE — Therapy (Signed)
 OUTPATIENT PHYSICAL THERAPY LOWER EXTREMITY TREATMENT   Patient Name: Diana Skinner MRN: [id number] DOB:Jan 04, 1951, 73 y.o., female Today's Date: 05/13/2024  END OF SESSION:  PT End of Session - 05/13/24 1301     Visit Number 8    Number of Visits 12    Date for PT Re-Evaluation 05/20/24    Authorization Type [employer] MEDICARE    Authorization - Visit Number 7    Progress Note Due on Visit 10    PT Start Time 1301    PT Stop Time 1341    PT Time Calculation (min) 40 min    Activity Tolerance Patient tolerated treatment well    Behavior During Therapy [facility] for tasks assessed/performed                   Past Medical History:  Diagnosis Date   Anxiety    Arthritis    High cholesterol    HTN (hypertension)    Migraines    Past Surgical History:  Procedure Laterality Date   CATARACT EXTRACTION W/PHACO Right 01/27/2019   Procedure: CATARACT EXTRACTION PHACO AND INTRAOCULAR LENS PLACEMENT (IOC);  Surgeon: Tarri Farm, MD;  Location: [facility];  Service: Ophthalmology;  Laterality: Right;  CDE: 8.33   CATARACT EXTRACTION W/PHACO Left 02/10/2019   Procedure: CATARACT EXTRACTION PHACO AND INTRAOCULAR LENS PLACEMENT (IOC);  Surgeon: Tarri Farm, MD;  Location: [facility];  Service: Ophthalmology;  Laterality: Left;  CDE: 5.49   CESAREAN SECTION     COLONOSCOPY  06/19/2012   Procedure: COLONOSCOPY;  Surgeon: Ruby Corporal, MD;  Location: [facility] SUITE;  Service: Endoscopy;  Laterality: N/A;  1030   ORIF RADIAL FRACTURE Right 08/27/2016   Procedure: OPEN REDUCTION INTERNAL FIXATION (ORIF) RADIAL FRACTURE/DISTAL;  Surgeon: Mauricia South, MD;  Location: [facility] OR;  Service: Plastics;  Laterality: Right;   TONSILLECTOMY AND ADENOIDECTOMY     TOTAL KNEE ARTHROPLASTY Left 03/25/2024   Procedure: ARTHROPLASTY, KNEE, TOTAL;  Surgeon: Darrin Emerald, MD;  Location: [facility];  Service: Orthopedics;  Laterality: Left;   Patient Active Problem List   Diagnosis Date Noted   S/P total knee  arthroplasty, left 03/25/24 04/08/2024   Primary osteoarthritis of left knee 03/25/2024   Fracture of right wrist 08/27/2016   Bursitis of knee 05/30/2011   KNEE PAIN 01/10/2010   CLOSED FRACTURE OF UPPER END OF TIBIA 01/10/2010   DERANGEMENT MENISCUS 01/03/2010    PCP: Minus Amel, MD   REFERRING PROVIDER: Darrin Emerald, MD  REFERRING DIAG: M17.0 (ICD-10-CM) - Primary osteoarthritis of both knees Z01.818 (ICD-10-CM) - Pre-op exam  THERAPY DIAG:  Decreased ROM of left knee  Weakness of left lower extremity  Impaired functional mobility, balance, gait, and endurance  Rationale for Evaluation and Treatment: Rehabilitation  ONSET DATE: 03/25/24  SUBJECTIVE:   SUBJECTIVE STATEMENT: Pt states she had a intense pain in left knee Sunday and it still hurts like that sometimes, even rolling over in bed. Pt did walk and perform knee drive stretch on Sunday but does not know what could have caused this pain. Pt states it has improved. Pt states 4/10 pain upon presentation. Pt states compliance with HEP.  Pt is s/p L TKA on 03/25/24. Pt has not been using the RW since the first couple of days of being home, pt has only been using cane on uneven surfaces. Pt states that she is about to have the right knee replaced when left knee is recovered. Pt states she loves the CPM machine  and that she has even fallen asleep with LLE in it. Pt states she had some blood come from surgical site, Dr. Phyllis Breeze evaluated. Staples to be taken out this Thursday.  PERTINENT HISTORY: -HTN, controlled PAIN:  Are you having pain? Yes: NPRS scale: 5/10 Pain location: left knee Pain description: stiffness Aggravating factors: tighter in the morning Relieving factors: moving helps loosening up, ice  PRECAUTIONS: None  RED FLAGS: None   WEIGHT BEARING RESTRICTIONS: No  FALLS:  Has patient fallen in last 6 months? No  LIVING ENVIRONMENT: Lives with: lives with their spouse Lives in:  House/apartment Stairs: Yes: External: 5 steps; on right going up Has following equipment at home: None  OCCUPATION: retired  PLOF: Independent  PATIENT GOALS: get back to mowing yard, would like to feel like she has a real knee, get back to cleaning, and being able to walk local trail  NEXT MD VISIT:  June 12th, 2025  OBJECTIVE:  Note: Objective measures were completed at Evaluation unless otherwise noted.  DIAGNOSTIC FINDINGS:  CLINICAL DATA:  Status post left total knee arthroplasty.   EXAM: PORTABLE LEFT KNEE - 1-2 VIEW   COMPARISON:  None Available.   FINDINGS: Left knee arthroplasty in expected alignment. No periprosthetic lucency or fracture. There has been patellar resurfacing. Recent postsurgical change includes air and edema in the soft tissues and joint space. Anterior skin staples in place.   IMPRESSION: Left knee arthroplasty without immediate postoperative complication.  PATIENT SURVEYS:  LEFS 49/80 04/29/24: LEFS: 67/80    COGNITION: Overall cognitive status: Within functional limits for tasks assessed     SENSATION: Light touch: Impaired  medial malleolus on right side, felt less  EDEMA:  Left swelling some  PALPATION: Tenderness to anterior medial left knee  LOWER EXTREMITY ROM:  Active ROM Right eval Left eval Left 04/29/24  Hip flexion     Hip extension     Hip abduction     Hip adduction     Hip internal rotation     Hip external rotation     Knee flexion  100, pain  124 degrees, little discomfort, tight  Knee extension  -4, not quite neutral Neutral, knee flat to table  Ankle dorsiflexion     Ankle plantarflexion     Ankle inversion     Ankle eversion      (Blank rows = not tested)  LOWER EXTREMITY MMT:  MMT Right 04/19/24 Left 04/19/24  Hip flexion 4+ 4+  Hip extension 4+ 4+  Hip abduction 4+ 4+  Hip adduction 4+ 4+  Hip internal rotation    Hip external rotation    Knee flexion 4+ 4-  Knee extension 4+ 4-  Ankle  dorsiflexion 4+ 4+  Ankle plantarflexion    Ankle inversion    Ankle eversion     (Blank rows = not tested)   FUNCTIONAL TESTS:  5 times sit to stand: 21.5 seconds 5TSTS: 12.91 seconds  GAIT: Distance walked: 50 feet Assistive device utilized: None Level of assistance: Complete Independence Comments: Pt presents with no AD. Slight antalgic gait pattern due to recent left knee surgery. Pt demonstrates decreased velocity and decreased right stance time.  TREATMENT DATE:  05/13/2024  Therapeutic Exercise: -Stationary bike, level 2 resistance, 5 minutes -Heel raises, 1 sets of 20 reps -Speed step ups, 8 inch step, 1 sets, 13.5 reps, 15 reps, pt uses occasional UE support -Lateral step up and overs, 2 sets of 5 reps -TKE, 6 inch step, 3 set of 10 reps, pt cued for decreased UE support -Aeromat walks, lateral stepping/tandem walking, 3 laps, in parallel bars, pt requires occasional UE support -Sled pushes/pulls, 4 laps, 60 feet laps, 40 pounds -Leg Press, 3 sets of 10 reps, plate 4 BLE, plate 3 for last 2 sets with LLE only -Knee drive stretch, 5 sets of 10 second holds -Hamstring stretch, 2 sets of 30 seconds, standing with L foot elevated on 12 inch step -Standing Calf stretch, on incline, 2 sets of 30 seconds    05/06/24: - Bike 5 minute, seat 9, full rotations - Squat to heel raises 10x - SLS 8-9" max BLE - Vector stance 3x5" with 1 HHA - 4in Lateral step up 10x - 4in step down 20x  - [employer] 3Pl Retro and sidestep 3RT each - Leg press 4Pl 3x 10 - Slant board 3x 30"  04/29/2024  Progress note:  , 5TSTS, ROM measured, strength assessed, updated HEP, goals tracked Therapeutic Exercise: -Heel slides, 2 sets of 10 reps -Bike 5 minute, seat 9, full rotations, pt demonstrates increased performance -Sit to stands 2 sets of 5 reps throughout  session    04/25/2024  Manual Therapy: -STM to L knee and corresponding musculature, desensitizing techniques  -Patellar mobilizations, grade II and III, all planes Therapeutic Exercise: -Heel raises, 2 sets of 20 reps -DKTC, 2 sets of 10 reps on green exercise ball, pt cued for slow controlled movement -TKE, 4 inch step, 3 set of 10 reps, pt cued for decreased UE support -Aeromat walks, lateral stepping/tandem walking, 3 laps, in parallel bars -Lateral stepping 2 laps 20 feet per lap, with RTB around ankles, pt cued for upright posture and decreased toe out compensation -Knee drive stretch, 10 sets of 10 second holds, 115 degrees of flexion -Bike 5 minute, seat 9, full rotations, pt demonstrates increased performance -Sit to stands to staggered stance on blue foam with yellow ball trampoline toss, 2 sets of 10 reps    04/22/2024  Therapeutic Exercise: -Supine bridges 2 sets of 10 reps, 3 second holds, pt cued for max hip extension -DKTC, 2 sets of 10 reps on green exercise ball, pt cued for slow controlled movement -Ankle weight 3#, walking marches/butt kicks, 3 laps each variation, 60 foot laps -Standing 3 way hip on floor cross, 1 sets 10 reps, LLE only, pt cued for upright trunk and maintaining of neutral spine -Lateral stepping 2 laps 20 feet per lap, with RTB around ankles, pt cued for upright posture and decreased toe out compensation -Cone touches from blue foam, 3 sets of 10 reps, bilaterally    Therapeutic Activity: -Sit to stands, 2 sets of 4 reps, throughout session -Lateral step up and overs, 8 inch step, 2 sets of 7 reps -Stair ambulation, 3 trials, pt cued for decreased UE support      PATIENT EDUCATION:  Education details: Pt was educated on findings of PT evaluation, prognosis, frequency of therapy visits and rationale, attendance policy, and HEP if given.   Person educated: Patient Education method: Explanation, Verbal cues, and Handouts Education  comprehension: verbalized understanding, verbal cues required, and needs further education  HOME EXERCISE PROGRAM: Access Code: [email or web address] URL: [email or web address] Date:  04/08/2024 Prepared by: Armond Bertin  Exercises - Supine Heel Slide with Strap  - 1 x daily - 3 sets - 15-20 reps - 3 sec hold - Seated Long Arc Quad  - 1 x daily - 1 sets - 10 reps - 5s hold - Active Straight Leg Raise with Quad Set  - 1 x daily - 7 x weekly - 3 sets - 10 reps - Long Sitting Quad Set with Towel Roll Under Heel  - 1 x daily - 7 x weekly - 3 sets - 10 reps - Standing Hip Abduction with Counter Support  - 1 x daily - 7 x weekly - 3 sets - 10 reps  Patient Education - Total Knee Replacement Handout  Access Code: [email or web address] URL: [email or web address] Date: 04/29/2024 Prepared by: Armond Bertin  Exercises - Sit to Stand with Arms Crossed  - 1 x daily - 7 x weekly - 3 sets - 10 reps - Standing March with Counter Support  - 1 x daily - 7 x weekly - 3 sets - 10 reps - Heel Raises with Counter Support  - 1 x daily - 7 x weekly - 3 sets - 10 reps - Step Up  - 1 x daily - 7 x weekly - 3 sets - 10 reps  05/06/24: - Single Leg Stance  - 2 x daily - 7 x weekly - 1 sets - 3 reps - 30" hold - Standing 3-Way Kick  - 2 x daily - 7 x weekly - 1 sets - 3 reps  ASSESSMENT:  CLINICAL IMPRESSION: Patient continues to demonstrate pain in Left knee, decreased LE strength, decreased gait quality and balance. Patient also demonstrates decreased endurance with aerobic based exercise during today's session. Patient able to progress dynamic balance and core activation exercises today with aeromat walking and step up variations, good performance with verbal cueing. Patient would continue to benefit from skilled physical therapy for increased endurance with ambulation, increased LE strength, and improved balance for improved quality of life, improved independence with gait training and continued progress  towards therapy goals.   Patient demonstrates great progress to return to baseline following LTKA, decreased LE strength, abnormal sensation of LLE, and impaired balance. Patient also demonstrates difficulty with ambulation during today's session with decreased stride length and velocity noted. Patient also demonstrates decreased L knee ROM. Patient would benefit from skilled physical therapy for increased endurance with ambulation, increased LLE strength/ROM, and balance for improved gait quality, return to higher level of function with ADLs, and progress towards therapy goals.   OBJECTIVE IMPAIRMENTS: Abnormal gait, decreased balance, decreased endurance, decreased mobility, difficulty walking, decreased ROM, decreased strength, and impaired sensation.   ACTIVITY LIMITATIONS: carrying, lifting, bending, squatting, sleeping, stairs, transfers, and caring for others  PARTICIPATION LIMITATIONS: meal prep, cleaning, laundry, shopping, community activity, occupation, and yard work  PERSONAL FACTORS: Age, Fitness, and Time since onset of injury/illness/exacerbation are also affecting patient's functional outcome.   REHAB POTENTIAL: Good  CLINICAL DECISION MAKING: Stable/uncomplicated  EVALUATION COMPLEXITY: Low   GOALS: Goals reviewed with patient? No  SHORT TERM GOALS: Target date: 04/29/24  Patient will demonstrate evidence of independence with individualized HEP and will report compliance for at least 3 days per week for optimized progression towards remaining therapy goals. Baseline:  Goal status: MET  2.  Patient will report a decrease in pain level/tightness during community ambulation by at least 2 points for improved quality of life. Baseline: see objective Goal status: MET     LONG  TERM GOALS: Target date: 05/20/24  Pt will demonstrate a an increase of at least 9 points on the LEFS for improved performance of community ambulation and ADL. Baseline: see objective Goal  status: MET  2.  Pt will demonstrate ability to complete within age related norms for improved functional ambulatory capacity in community setting.  Baseline: not tested due to antalgic gait pattern at eval, 566 Goal status: MET  3.  Pt will demonstrate WFL ROM (flexion and extension) in left knee, for increased mobility and maximal efficiency of gait cycle during ambulation. Baseline: see objective Goal status: MET  4.  Pt will demonstrate at least 4+/5 MMT for right lower extremity for increased strength during ADL and community ambulation. Baseline: see objective Goal status: IN PROGRESS  5.  Pt will improve 5TSTS by at least 2.3 seconds in order to improve LE strength during functional activities. Baseline: see objective Goal status: MET    PLAN:  PT FREQUENCY: 2x/week  PT DURATION: 6 weeks  PLANNED INTERVENTIONS: 97110-Therapeutic exercises, 97530- Therapeutic activity, [id number]- Neuromuscular re-education, 97535- Self Care, 29562- Manual therapy, [phone]- Gait training, Patient/Family education, Balance training, Stair training, Joint mobilization, DME instructions, Cryotherapy, and Moist heat  PLAN FOR NEXT SESSION: Progress LE strengthening and ROM for L knee. Progress HEP to pt toleration. Continue to encourage walking program.   Armond Bertin, PT, DPT [facility] Office: [phone] 3:07 PM, 05/13/24

## 2024-05-15 ENCOUNTER — Ambulatory Visit: Admitting: Orthopedic Surgery

## 2024-05-15 ENCOUNTER — Encounter: Payer: Self-pay | Admitting: Orthopedic Surgery

## 2024-05-15 DIAGNOSIS — M1712 Unilateral primary osteoarthritis, left knee: Secondary | ICD-10-CM

## 2024-05-15 DIAGNOSIS — Z01818 Encounter for other preprocedural examination: Secondary | ICD-10-CM

## 2024-05-15 DIAGNOSIS — M1711 Unilateral primary osteoarthritis, right knee: Secondary | ICD-10-CM

## 2024-05-15 DIAGNOSIS — Z96652 Presence of left artificial knee joint: Secondary | ICD-10-CM

## 2024-05-15 MED ORDER — BUPIVACAINE-MELOXICAM ER 400-12 MG/14ML IJ SOLN: 400.0000 mg | Freq: Once | INTRAMUSCULAR | Status: DC

## 2024-05-15 NOTE — Progress Notes (Signed)
   There were no vitals taken for this visit.  There is no height or weight on file to calculate BMI.  Chief Complaint  Patient presents with   Post-op Follow-up    Left knee replaced 03/25/24     No diagnosis found.  DOI/DOS/ Date: 03/25/24  Improved

## 2024-05-15 NOTE — Patient Instructions (Signed)
 Your surgery will be at [facility] by Dr Phyllis Breeze on July 15 plan to be in hospital overnight. The hospital will contact you with a preoperative appointment to discuss Anesthesia.  Please arrive on time or 15 minutes early for the preoperative appointment, they have a very tight schedule if you are late or do not come in your surgery will be cancelled.  The phone number for the preop area is [phone]. Please bring your medications with you for the appointment. They will tell you the arrival time for surgery and medication instructions when you have your preoperative evaluation. Do not wear nail polish the day of your surgery and if you take Phentermine you need to stop this medication ONE WEEK prior to your surgery. f you take Invokana, Farxiga, Jardiance, or Steglatro) - Hold 72 hours before the procedure.  If you take Ozempic,  Bydureo, Mounjaro, [place] or Trulicity do not take for 8 days before your surgery. If you take Victoza, Rybelsis, Saxenda or Adlyxi stop 24 hours before the procedure. Please arrive at the hospital 2 hours before procedure if scheduled at 9:30 or later in the day or at the time the nurse tells you at your preoperative visit.   If you have my chart do not use the time given in my chart use the time given to you by the nurse during your preoperative visit.   Your surgery  time may change. Please be available for phone calls the day of your surgery and the day before. The Short Stay department may need to discuss changes about your surgery time. Not reaching the you could lead to procedure delays and possible cancellation.  You must have a ride home and someone to stay with you for 24 to 48 hours. The person taking you home will receive and sign for the your discharge instructions.  Please be prepared to give your support person's name and telephone number to [facility] [employer]. Dr Phyllis Breeze will need that name and phone number post procedure.   You will also get a call  from a representative of [employer], they have a machine that you will use in the first few weeks after surgery. It is called a CPM.   You will have home physical therapy for 2 weeks after surgery, the home health agency will call you before or just following the surgery to set up visits. [facility] is the agency we normally use, unless you request another agency.   You will get a call also from outpatient therapy for therapy starting when the home therapy is done.  If you have questions or need to Reschedule the surgery, call the office ask for Kelsee Preslar.

## 2024-05-15 NOTE — Addendum Note (Signed)
 Addended byArla Lab on: 05/15/2024 11:59 AM   Modules accepted: Orders

## 2024-05-15 NOTE — Progress Notes (Signed)
  Chief Complaint  Patient presents with   Post-op Follow-up    Left knee replaced 03/25/24     Encounter Diagnoses  Name Primary?   S/P total knee arthroplasty, left 03/25/24 Yes   Primary osteoarthritis of left knee     DOI/DOS/ Date: 03/25/24  Improved  Diana Skinner continues to do well she has reached 220 degrees on her range of motion full extension does have a small effusion no pain  She did have an episode of posterior knee pain which was intense she described as a 10 out of 10 pain in the back of the knee which subsequently resolved with ice and Tylenol   She would like her right knee done  So we will proceed with right total knee arthroplasty  She remembers all of the risk-benefit ratios that were previously discussed  Plan for surgery July 15

## 2024-05-16 ENCOUNTER — Encounter (HOSPITAL_COMMUNITY)

## 2024-05-20 ENCOUNTER — Ambulatory Visit (HOSPITAL_COMMUNITY)

## 2024-05-20 DIAGNOSIS — Z7409 Other reduced mobility: Secondary | ICD-10-CM | POA: Diagnosis not present

## 2024-05-20 DIAGNOSIS — M25662 Stiffness of left knee, not elsewhere classified: Secondary | ICD-10-CM

## 2024-05-20 DIAGNOSIS — R29898 Other symptoms and signs involving the musculoskeletal system: Secondary | ICD-10-CM

## 2024-05-20 NOTE — Therapy (Signed)
 OUTPATIENT PHYSICAL THERAPY LOWER EXTREMITY TREATMENT/DISCHARGE   PHYSICAL THERAPY DISCHARGE SUMMARY  Visits from Start of Care: 8  Current functional level related to goals / functional outcomes: See below   Remaining deficits: none   Education / Equipment: See below   Patient agrees to discharge. Patient goals were met. Patient is being discharged due to meeting the stated rehab goals.   Patient Name: Diana Skinner MRN: [id number] DOB:March 10, 1951, 73 y.o., female Today's Date: 05/20/2024  END OF SESSION:  PT End of Session - 05/20/24 1302     Visit Number 9    Number of Visits 12    Date for PT Re-Evaluation 05/20/24    Authorization Type [employer] MEDICARE    Authorization Time Period seeking auth; requested additional treatments on 5/27 visit #6    Authorization - Visit Number 8    Progress Note Due on Visit 10    PT Start Time 1303    PT Stop Time 1345    PT Time Calculation (min) 42 min    Activity Tolerance Patient tolerated treatment well    Behavior During Therapy [facility] for tasks assessed/performed                 Past Medical History:  Diagnosis Date   Anxiety    Arthritis    High cholesterol    HTN (hypertension)    Migraines    Past Surgical History:  Procedure Laterality Date   CATARACT EXTRACTION W/PHACO Right 01/27/2019   Procedure: CATARACT EXTRACTION PHACO AND INTRAOCULAR LENS PLACEMENT (IOC);  Surgeon: Tarri Farm, MD;  Location: [facility];  Service: Ophthalmology;  Laterality: Right;  CDE: 8.33   CATARACT EXTRACTION W/PHACO Left 02/10/2019   Procedure: CATARACT EXTRACTION PHACO AND INTRAOCULAR LENS PLACEMENT (IOC);  Surgeon: Tarri Farm, MD;  Location: [facility];  Service: Ophthalmology;  Laterality: Left;  CDE: 5.49   CESAREAN SECTION     COLONOSCOPY  06/19/2012   Procedure: COLONOSCOPY;  Surgeon: Ruby Corporal, MD;  Location: [facility];  Service: Endoscopy;  Laterality: N/A;  1030   ORIF RADIAL FRACTURE Right 08/27/2016   Procedure:  OPEN REDUCTION INTERNAL FIXATION (ORIF) RADIAL FRACTURE/DISTAL;  Surgeon: Mauricia South, MD;  Location: [facility];  Service: Plastics;  Laterality: Right;   TONSILLECTOMY AND ADENOIDECTOMY     TOTAL KNEE ARTHROPLASTY Left 03/25/2024   Procedure: ARTHROPLASTY, KNEE, TOTAL;  Surgeon: Darrin Emerald, MD;  Location: [facility];  Service: Orthopedics;  Laterality: Left;   Patient Active Problem List   Diagnosis Date Noted   S/P total knee arthroplasty, left 03/25/24 04/08/2024   Primary osteoarthritis of left knee 03/25/2024   Fracture of right wrist 08/27/2016   Bursitis of knee 05/30/2011   KNEE PAIN 01/10/2010   CLOSED FRACTURE OF UPPER END OF TIBIA 01/10/2010   DERANGEMENT MENISCUS 01/03/2010    PCP: Minus Amel, MD   REFERRING PROVIDER: Darrin Emerald, MD  REFERRING DIAG: M17.0 (ICD-10-CM) - Primary osteoarthritis of both knees Z01.818 (ICD-10-CM) - Pre-op exam  THERAPY DIAG:  Decreased ROM of left knee  Weakness of left lower extremity  Impaired functional mobility, balance, gait, and endurance  Rationale for Evaluation and Treatment: Rehabilitation  ONSET DATE: 03/25/24  SUBJECTIVE:   SUBJECTIVE STATEMENT: Pt states she feels her knee is about 90% since the start of therapy. Pt states she is still feeling some swelling and stiffness in the morning. Pt states she has been walking a mile 2-3 times per week on her neighborhood trail. Pt has  been scheduled for right total knee replacement July 15th.   Pt states she had a intense pain in left knee Sunday and it still hurts like that sometimes, even rolling over in bed. Pt did walk and perform knee drive stretch on Sunday but does not know what could have caused this pain. Pt states it has improved. Pt states 4/10 pain upon presentation. Pt states compliance with HEP.  Pt is s/p L TKA on 03/25/24. Pt has not been using the RW since the first couple of days of being home, pt has only been using cane on uneven surfaces. Pt states  that she is about to have the right knee replaced when left knee is recovered. Pt states she loves the CPM machine and that she has even fallen asleep with LLE in it. Pt states she had some blood come from surgical site, Dr. Phyllis Breeze evaluated. Staples to be taken out this Thursday.  PERTINENT HISTORY: -HTN, controlled PAIN:  Are you having pain? Yes: NPRS scale: 5/10 Pain location: left knee Pain description: stiffness Aggravating factors: tighter in the morning Relieving factors: moving helps loosening up, ice  PRECAUTIONS: None  RED FLAGS: None   WEIGHT BEARING RESTRICTIONS: No  FALLS:  Has patient fallen in last 6 months? No  LIVING ENVIRONMENT: Lives with: lives with their spouse Lives in: House/apartment Stairs: Yes: External: 5 steps; on right going up Has following equipment at home: None  OCCUPATION: retired  PLOF: Independent  PATIENT GOALS: get back to mowing yard, would like to feel like she has a real knee, get back to cleaning, and being able to walk local trail  NEXT MD VISIT:  June 12th, 2025  OBJECTIVE:  Note: Objective measures were completed at Evaluation unless otherwise noted.  DIAGNOSTIC FINDINGS:  CLINICAL DATA:  Status post left total knee arthroplasty.   EXAM: PORTABLE LEFT KNEE - 1-2 VIEW   COMPARISON:  None Available.   FINDINGS: Left knee arthroplasty in expected alignment. No periprosthetic lucency or fracture. There has been patellar resurfacing. Recent postsurgical change includes air and edema in the soft tissues and joint space. Anterior skin staples in place.   IMPRESSION: Left knee arthroplasty without immediate postoperative complication.  PATIENT SURVEYS:  LEFS 49/80 04/29/24: LEFS: 67/80    COGNITION: Overall cognitive status: Within functional limits for tasks assessed     SENSATION: Light touch: Impaired  medial malleolus on right side, felt less  EDEMA:  Left swelling some  PALPATION: Tenderness to anterior  medial left knee  LOWER EXTREMITY ROM:  Active ROM Right eval Left eval Left 04/29/24  Hip flexion     Hip extension     Hip abduction     Hip adduction     Hip internal rotation     Hip external rotation     Knee flexion  100, pain  124 degrees, little discomfort, tight  Knee extension  -4, not quite neutral Neutral, knee flat to table  Ankle dorsiflexion     Ankle plantarflexion     Ankle inversion     Ankle eversion      (Blank rows = not tested)  LOWER EXTREMITY MMT:  MMT Right 04/19/24 Left 04/19/24 Left 05/20/24  Hip flexion 4+ 4+   Hip extension 4+ 4+   Hip abduction 4+ 4+   Hip adduction 4+ 4+   Hip internal rotation     Hip external rotation     Knee flexion 4+ 4- 4+  Knee extension 4+ 4- 4+  Ankle dorsiflexion 4+ 4+   Ankle plantarflexion     Ankle inversion     Ankle eversion      (Blank rows = not tested)   FUNCTIONAL TESTS:  5 times sit to stand: 21.5 seconds 5TSTS: 12.91 seconds  GAIT: Distance walked: 50 feet Assistive device utilized: None Level of assistance: Complete Independence Comments: Pt presents with no AD. Slight antalgic gait pattern due to recent left knee surgery. Pt demonstrates decreased velocity and decreased right stance time.                                                                                                                                TREATMENT DATE:  05/20/2024  Therapeutic Exercise: -Treadmill, grade 2, 5 minutes, 1.4, 1.8, 2.3 speeds -Monster walks, green band at ankles, 2 laps, 30 feet -Lateral stepping, green band at ankles, 2 laps, 30 feet -Forward lunges, 1 set of 8 reps bilaterally -Runner Climb step ups, 1 set of 8 reps bilaterally -Supine Bridges, 1 sets of 10 reps  Therapeutic Activity: -STS, 1 set of 20 reps, pt cued for controlled descent -POC, HEP revision    05/13/2024  Therapeutic Exercise: -Stationary bike, level 2 resistance, 5 minutes -Heel raises, 1 sets of 20 reps -Speed step ups,  8 inch step, 1 sets, 13.5 reps, 15 reps, pt uses occasional UE support -Lateral step up and overs, 2 sets of 5 reps -TKE, 6 inch step, 3 set of 10 reps, pt cued for decreased UE support -Aeromat walks, lateral stepping/tandem walking, 3 laps, in parallel bars, pt requires occasional UE support -Sled pushes/pulls, 4 laps, 60 feet laps, 40 pounds -Leg Press, 3 sets of 10 reps, plate 4 BLE, plate 3 for last 2 sets with LLE only -Knee drive stretch, 5 sets of 10 second holds -Hamstring stretch, 2 sets of 30 seconds, standing with L foot elevated on 12 inch step -Standing Calf stretch, on incline, 2 sets of 30 seconds    05/06/24: - Bike 5 minute, seat 9, full rotations - Squat to heel raises 10x - SLS 8-9 max BLE - Vector stance 3x5 with 1 HHA - 4in Lateral step up 10x - 4in step down 20x  - [employer]craft 3Pl Retro and sidestep 3RT each - Leg press 4Pl 3x 10 - Slant board 3x 30    PATIENT EDUCATION:  Education details: Pt was educated on findings of PT evaluation, prognosis, frequency of therapy visits and rationale, attendance policy, and HEP if given.   Person educated: Patient Education method: Explanation, Verbal cues, and Handouts Education comprehension: verbalized understanding, verbal cues required, and needs further education  HOME EXERCISE PROGRAM: Access Code: [email or web address] URL: [email or web address] Date: 04/08/2024 Prepared by: Armond Bertin  Exercises - Supine Heel Slide with Strap  - 1 x daily - 3 sets - 15-20 reps - 3 sec hold - Seated Long Arc Quad  - 1 x daily - 1 sets - 10 reps -  5s hold - Active Straight Leg Raise with Quad Set  - 1 x daily - 7 x weekly - 3 sets - 10 reps - Long Sitting Quad Set with Towel Roll Under Heel  - 1 x daily - 7 x weekly - 3 sets - 10 reps - Standing Hip Abduction with Counter Support  - 1 x daily - 7 x weekly - 3 sets - 10 reps  Patient Education - Total Knee Replacement Handout  Access Code: [email or web address] URL:  [email or web address] Date: 04/29/2024 Prepared by: Armond Bertin  Exercises - Sit to Stand with Arms Crossed  - 1 x daily - 7 x weekly - 3 sets - 10 reps - Standing March with Counter Support  - 1 x daily - 7 x weekly - 3 sets - 10 reps - Heel Raises with Counter Support  - 1 x daily - 7 x weekly - 3 sets - 10 reps - Step Up  - 1 x daily - 7 x weekly - 3 sets - 10 reps  05/06/24: - Single Leg Stance  - 2 x daily - 7 x weekly - 1 sets - 3 reps - 30 hold - Standing 3-Way Kick  - 2 x daily - 7 x weekly - 1 sets - 3 reps  ASSESSMENT:  CLINICAL IMPRESSION: Patient continues to demonstrate increased LLE strength, improved gait quality and balance. Patient also demonstrates increased WB endurance with aerobic based exercise during today's session on treadmill. Patient able to progress dynamic balance and core activation exercises today with banded walks and supine bridge variation, good performance with verbal cueing. Pt educated on the importance of HEP compliance especially in view of getting other knee replaced in about a month. HEP revisions made for prehab of right knee. Patient to be discharged at this time due to meeting all therapy goals.    Patient demonstrates great progress to return to baseline following LTKA, decreased LE strength, abnormal sensation of LLE, and impaired balance. Patient also demonstrates difficulty with ambulation during today's session with decreased stride length and velocity noted. Patient also demonstrates decreased L knee ROM. Patient would benefit from skilled physical therapy for increased endurance with ambulation, increased LLE strength/ROM, and balance for improved gait quality, return to higher level of function with ADLs, and progress towards therapy goals.   OBJECTIVE IMPAIRMENTS: Abnormal gait, decreased balance, decreased endurance, decreased mobility, difficulty walking, decreased ROM, decreased strength, and impaired sensation.   ACTIVITY  LIMITATIONS: carrying, lifting, bending, squatting, sleeping, stairs, transfers, and caring for others  PARTICIPATION LIMITATIONS: meal prep, cleaning, laundry, shopping, community activity, occupation, and yard work  PERSONAL FACTORS: Age, Fitness, and Time since onset of injury/illness/exacerbation are also affecting patient's functional outcome.   REHAB POTENTIAL: Good  CLINICAL DECISION MAKING: Stable/uncomplicated  EVALUATION COMPLEXITY: Low   GOALS: Goals reviewed with patient? No  SHORT TERM GOALS: Target date: 04/29/24  Patient will demonstrate evidence of independence with individualized HEP and will report compliance for at least 3 days per week for optimized progression towards remaining therapy goals. Baseline:  Goal status: MET  2.  Patient will report a decrease in pain level/tightness during community ambulation by at least 2 points for improved quality of life. Baseline: see objective Goal status: MET     LONG TERM GOALS: Target date: 05/20/24  Pt will demonstrate a an increase of at least 9 points on the LEFS for improved performance of community ambulation and ADL. Baseline: see objective Goal status: MET  2.  Pt will  demonstrate ability to complete within age related norms for improved functional ambulatory capacity in community setting.  Baseline: not tested due to antalgic gait pattern at eval, 566 Goal status: MET  3.  Pt will demonstrate WFL ROM (flexion and extension) in left knee, for increased mobility and maximal efficiency of gait cycle during ambulation. Baseline: see objective Goal status: MET  4.  Pt will demonstrate at least 4+/5 MMT for right lower extremity for increased strength during ADL and community ambulation. Baseline: see objective Goal status: MET  5.  Pt will improve 5TSTS by at least 2.3 seconds in order to improve LE strength during functional activities. Baseline: see objective Goal status: MET    PLAN:  PT  FREQUENCY: 2x/week  PT DURATION: 6 weeks  PLANNED INTERVENTIONS: 97110-Therapeutic exercises, 97530- Therapeutic activity, 97112- Neuromuscular re-education, 97535- Self Care, 16109- Manual therapy, [phone]- Gait training, Patient/Family education, Balance training, Stair training, Joint mobilization, DME instructions, Cryotherapy, and Moist heat  PLAN FOR NEXT SESSION: Discharged  Armond Bertin, PT, DPT [facility] Office: [phone] 1:46 PM, 05/20/24

## 2024-05-23 ENCOUNTER — Encounter (HOSPITAL_COMMUNITY)

## 2024-06-07 ENCOUNTER — Other Ambulatory Visit: Payer: Self-pay | Admitting: Orthopedic Surgery

## 2024-06-10 ENCOUNTER — Encounter (HOSPITAL_COMMUNITY)
Admission: RE | Admit: 2024-06-10 | Discharge: 2024-06-10 | Disposition: A | Discharge status: Home / Self Care | Source: Ambulatory Visit | Attending: Orthopedic Surgery | Admitting: Orthopedic Surgery

## 2024-06-10 ENCOUNTER — Encounter (HOSPITAL_COMMUNITY): Payer: Self-pay

## 2024-06-10 VITALS — BP 127/77 | HR 65 | Resp 18 | Ht 67.0 in | Wt 164.9 lb

## 2024-06-10 DIAGNOSIS — Z01818 Encounter for other preprocedural examination: Secondary | ICD-10-CM | POA: Insufficient documentation

## 2024-06-10 DIAGNOSIS — M1711 Unilateral primary osteoarthritis, right knee: Secondary | ICD-10-CM | POA: Insufficient documentation

## 2024-06-10 HISTORY — DX: Hypothyroidism, unspecified: E03.9

## 2024-06-10 HISTORY — DX: Unspecified asthma, uncomplicated: J45.909

## 2024-06-10 LAB — CBC WITH DIFFERENTIAL/PLATELET
Abs Immature Granulocytes: 0.01 K/uL (ref 0.00–0.07)
Basophils Absolute: 0 K/uL (ref 0.0–0.1)
Basophils Relative: 0 %
Eosinophils Absolute: 0.3 K/uL (ref 0.0–0.5)
Eosinophils Relative: 4 %
HCT: 41.1 % (ref 36.0–46.0)
Hemoglobin: 13.1 g/dL (ref 12.0–15.0)
Immature Granulocytes: 0 %
Lymphocytes Relative: 27 %
Lymphs Abs: 1.8 K/uL (ref 0.7–4.0)
MCH: 30 pg (ref 26.0–34.0)
MCHC: 31.9 g/dL (ref 30.0–36.0)
MCV: 94.1 fL (ref 80.0–100.0)
Monocytes Absolute: 0.5 K/uL (ref 0.1–1.0)
Monocytes Relative: 7 %
Neutro Abs: 4.3 K/uL (ref 1.7–7.7)
Neutrophils Relative %: 62 %
Platelets: 283 K/uL (ref 150–400)
RBC: 4.37 MIL/uL (ref 3.87–5.11)
RDW: 13.2 % (ref 11.5–15.5)
WBC: 6.9 K/uL (ref 4.0–10.5)
nRBC: 0 % (ref 0.0–0.2)

## 2024-06-10 LAB — SURGICAL PCR SCREEN
MRSA, PCR: NEGATIVE
Staphylococcus aureus: NEGATIVE

## 2024-06-10 LAB — BASIC METABOLIC PANEL WITH GFR
Anion gap: 8 (ref 5–15)
BUN: 16 mg/dL (ref 8–23)
CO2: 26 mmol/L (ref 22–32)
Calcium: 9.3 mg/dL (ref 8.9–10.3)
Chloride: 102 mmol/L (ref 98–111)
Creatinine, Ser: 0.89 mg/dL (ref 0.44–1.00)
GFR, Estimated: >60 mL/min (ref >60)
Glucose, Bld: 90 mg/dL (ref 70–99)
Potassium: 4.3 mmol/L (ref 3.5–5.1)
Sodium: 136 mmol/L (ref 135–145)

## 2024-06-10 LAB — PREPARE RBC (CROSSMATCH)

## 2024-06-10 NOTE — Patient Instructions (Signed)
 [facility]  06/10/2024     @PREFPERIOPPHARMACY @   Your procedure is scheduled on  06/17/2024.   Report to [facility] at  0815 A.M.   Call this number if you have problems the morning of surgery:  [phone]  If you experience any cold or flu symptoms such as cough, fever, chills, shortness of breath, etc. between now and your scheduled surgery, please notify us  at the above number.   Remember:  Do not eat after midnight.   You may drink clear liquids until 0615 am on 06/17/2024.    Clear liquids allowed are:                    Water , Juice (No red color; non-citric and without pulp; diabetics please choose diet or no sugar options), Carbonated beverages (diabetics please choose diet or no sugar options), Clear Tea (No creamer, milk, or cream, including half & half and powdered creamer), Black Coffee Only (No creamer, milk or cream, including half & half and powdered creamer), and Clear Sports drink (No red color; diabetics please choose diet or no sugar options)    Take these medicines the morning of surgery with A SIP OF WATER                         levothyroxine , tramadol , venlafaxine .    Do not wear jewelry, make-up or nail polish, including gel polish,  artificial nails, or any other type of covering on natural nails (fingers and  toes).  Do not wear lotions, powders, or perfumes, or deodorant.  Do not shave 48 hours prior to surgery.  Men may shave face and neck.  Do not bring valuables to the hospital.  [facility] is not responsible for any belongings or valuables.  Contacts, dentures or bridgework may not be worn into surgery.  Leave your suitcase in the car.  After surgery it may be brought to your room.  For patients admitted to the hospital, discharge time will be determined by your treatment team.  Patients discharged the day of surgery will not be allowed to drive home.    Special instructions:   DO NOT smoke tobacco or vape for 24 hours before  your procedure.  Please read over the following fact sheets that you were given. Pain Booklet, Coughing and Deep Breathing, Blood Transfusion Information, MRSA Information, Surgical Site Infection Prevention, Anesthesia Post-op Instructions, and Care and Recovery After Surgery      Total Knee Replacement Surgery: What to Know After After a total knee replacement, it's common to have: Redness, pain, and swelling at the cut from surgery area. This is also called the incision area. Stiffness. Discomfort. A small amount of blood or clear fluid coming from your cut. Follow these instructions at home: Medicines Take your medicines only as told. You may need to take steps to help treat or prevent trouble pooping (constipation), such as: Taking medicines to help you poop. Eating foods high in fiber, like beans, whole grains, and fresh fruits and vegetables. Drinking more fluids as told. Ask your health care provider if it's safe to drive or use machines while taking your medicine. Bathing Do not take baths, swim, or use a hot tub until you're told it's OK. Ask if you can shower. If you bandage isn't waterproof: Do not let it get wet. Cover it when you take a bath or shower. Use a cover that doesn't  let any water  in. Incision care Take care of your cut from surgery as told. Make sure you: Wash your hands with soap and water  for at least 20 seconds before and after you change your bandage. If you can't use soap and water , use hand sanitizer. Change your bandage. Leave stitches or skin glue alone. Leave tape strips alone unless you're told to take them off. You may trim the edges of the tape strips if they curl up. Check the area around your cut every day for signs of infection. Check for: More redness, swelling, or pain. More fluid or blood. Warmth. Pus or a bad smell.  Activity Rest as told. Get up to take short walks at least every 2 hours during the day. This helps you breathe better  and keeps your blood flowing. Ask for help if you feel weak or unsteady. Follow instructions from your provider about using a walker, crutches, or a cane. You may use your legs to support your body weight as told by your provider. Follow instructions about how much weight you may safely support on your affected leg. You may be shown how to get out of a bed and chair and how to go up and down stairs. You will first do this with a walker, crutches, or a cane. Once you are able to walk without a limp, you may stop using a walker, crutches, or a cane, as told by your provider. Do exercises as told by your provider to help restore your strength and knee movement. Exercises will start as soon as possible after your surgery. You'll work with a physical therapist for up to a few months after your surgery. Avoid high-impact activities. Avoid running, jumping rope, and doing jumping jacks. Do not play contact sports until your provider approves. Ask what things are safe for you to do at home. Ask when you can go back to work or school. Managing pain, stiffness, and swelling  Use ice or an ice pack as told. Put ice in a plastic bag or use the cold flow pad or cold therapy unit that you were given. Place a towel between your skin and the bag or device. Leave the ice on for 20 minutes, 2-3 times a day. If your skin turns red, take off the ice right away to prevent skin damage. The risk of damage is higher if you can't feel pain, heat, or cold. Move your toes often to reduce stiffness and swelling. Raise your leg above the level of your heart while you are sitting or lying down. Use several pillows to keep your leg straight. Do not put a pillow just under the knee. If the knee is bent for a long time, this may lead to stiffness. Wear an elastic knee support as told by your provider. Safety To help prevent falls, keep floors clear. Put things that you may need within easy reach. Wear an apron or tool belt  with pockets for carrying things. This leaves your hands free to help with your balance. Ask your provider when it is safe to drive. General instructions Wear compression stockings to reduce swelling and prevent blood clots in your legs. Keep doing breathing exercises as told. This helps prevent lung infection. Do not smoke, vape, or use nicotine or tobacco. Do not have dental work or cleanings for at least 3 months. Ask your provider if you need to take antibiotics before you have dental work or have your teeth cleaned. Tell your dentist about your new joint.  Keep all follow-up visits. Your provider will need to check how your knee is healing. Contact a health care provider if: You have a fever or chills. You have a cough or feel short of breath. You have very bad pain and medicine is not helping your pain. You have any signs of infection. You fall. You have increased swelling, redness, and calf pain. Your cut from surgery breaks open after your stitches or staples are taken out. Get help right away if: You have trouble breathing. You have a fast and irregular heartbeat. You have chest pain. These symptoms may be an emergency. Call 911 right away. Do not wait to see if the symptoms will go away. Do not drive yourself to the hospital. This information is not intended to replace advice given to you by your health care provider. Make sure you discuss any questions you have with your health care provider. Document Revised: 09/20/2023 Document Reviewed: 04/26/2023 Elsevier Patient Education  2024 [employer]General Anesthesia, Adult, Care After The following information offers guidance on how to care for yourself after your procedure. Your health care provider may also give you more specific instructions. If you have problems or questions, contact your health care provider. What can I expect after the procedure? After the procedure, it is common for people to: Have pain or discomfort at  the IV site. Have nausea or vomiting. Have a sore throat or hoarseness. Have trouble concentrating. Feel cold or chills. Feel weak, sleepy, or tired (fatigue). Have soreness and body aches. These can affect parts of the body that were not involved in surgery. Follow these instructions at home: For the time period you were told by your health care provider:  Rest. Do not participate in activities where you could fall or become injured. Do not drive or use machinery. Do not drink alcohol . Do not take sleeping pills or medicines that cause drowsiness. Do not make important decisions or sign legal documents. Do not take care of children on your own. General instructions Drink enough fluid to keep your urine pale yellow. If you have sleep apnea, surgery and certain medicines can increase your risk for breathing problems. Follow instructions from your health care provider about wearing your sleep device: Anytime you are sleeping, including during daytime naps. While taking prescription pain medicines, sleeping medicines, or medicines that make you drowsy. Return to your normal activities as told by your health care provider. Ask your health care provider what activities are safe for you. Take over-the-counter and prescription medicines only as told by your health care provider. Do not use any products that contain nicotine or tobacco. These products include cigarettes, chewing tobacco, and vaping devices, such as e-cigarettes. These can delay incision healing after surgery. If you need help quitting, ask your health care provider. Contact a health care provider if: You have nausea or vomiting that does not get better with medicine. You vomit every time you eat or drink. You have pain that does not get better with medicine. You cannot urinate or have bloody urine. You develop a skin rash. You have a fever. Get help right away if: You have trouble breathing. You have chest pain. You vomit  blood. These symptoms may be an emergency. Get help right away. Call 911. Do not wait to see if the symptoms will go away. Do not drive yourself to the hospital. Summary After the procedure, it is common to have a sore throat, hoarseness, nausea, vomiting, or to feel weak, sleepy, or fatigue. For  the time period you were told by your health care provider, do not drive or use machinery. Get help right away if you have difficulty breathing, have chest pain, or vomit blood. These symptoms may be an emergency. This information is not intended to replace advice given to you by your health care provider. Make sure you discuss any questions you have with your health care provider. Document Revised: 02/17/2022 Document Reviewed: 02/17/2022 Elsevier Patient Education  2024 [employer].How to Use Chlorhexidine  at Home in the Shower Chlorhexidine  gluconate (CHG) is a germ-killing (antiseptic) wash that's used to clean the skin. It can get rid of the germs that normally live on the skin and can keep them away for about 24 hours. If you're having surgery, you may be told to shower with CHG at home the night before surgery. This can help lower your risk for infection. To use CHG wash in the shower, follow the steps below. Supplies needed: CHG body wash. Clean washcloth. Clean towel. How to use CHG in the shower Follow these steps unless you're told to use CHG in a different way: Start the shower. Use your normal soap and shampoo to wash your face and hair. Turn off the shower or move out of the shower stream. Pour CHG onto a clean washcloth. Do not use any type of brush or rough sponge. Start at your neck, washing your body down to your toes. Make sure you: Wash the part of your body where the surgery will be done for at least 1 minute. Do not scrub. Do not use CHG on your head or face unless your health care provider tells you to. If it gets into your ears or eyes, rinse them well with water . Do not  wash your genitals with CHG. Wash your back and under your arms. Make sure to wash skin folds. Let the CHG sit on your skin for 1-2 minutes or as long as told. Rinse your entire body in the shower, including all body creases and folds. Turn off the shower. Dry off with a clean towel. Do not put anything on your skin afterward, such as powder, lotion, or perfume. Put on clean clothes or pajamas. If it's the night before surgery, sleep in clean sheets. General tips Use CHG only as told, and follow the instructions on the label. Use the full amount of CHG as told. This is often one bottle. Do not smoke and stay away from flames after using CHG. Your skin may feel sticky after using CHG. This is normal. The sticky feeling will go away as the CHG dries. Do not use CHG: If you have a chlorhexidine  allergy or have reacted to chlorhexidine  in the past. On open wounds or areas of skin that have broken skin, cuts, or scrapes. On babies younger than 73 months of age. Contact a health care provider if: You have questions about using CHG. Your skin gets irritated or itchy. You have a rash after using CHG. You swallow any CHG. Call your local poison control center [phone] in the [place] Your eyes itch badly, or they become very red or swollen. Your hearing changes. You have trouble seeing. If you can't reach your provider, go to an urgent care or emergency room. Do not drive yourself. Get help right away if: You have swelling or tingling in your mouth or throat. You make high-pitched whistling sounds when you breathe, most often when you breathe out (wheeze). You have trouble breathing. These symptoms may be an emergency. Call  911 right away. Do not wait to see if the symptoms will go away. Do not drive yourself to the hospital. This information is not intended to replace advice given to you by your health care provider. Make sure you discuss any questions you have with your health care  provider. Document Revised: 06/05/2023 Document Reviewed: 06/01/2022 Elsevier Patient Education  2024 [employer].How to Use an Incentive Spirometer An incentive spirometer is a tool that measures how well you are filling your lungs with each breath. Learning to take long, deep breaths using this tool can help you keep your lungs clear and active. This may help to reverse or lessen your chance of developing breathing (pulmonary) problems, especially infection. You may be asked to use a spirometer: After a surgery. If you have a lung problem or a history of smoking. After a long period of time when you have been unable to move or be active. If the spirometer includes an indicator to show the highest number that you have reached, your health care provider or respiratory therapist will help you set a goal. Keep a log of your progress as told by your health care provider. What are the risks? Breathing too quickly may cause dizziness or cause you to pass out. Take your time so you do not get dizzy or light-headed. If you are in pain, you may need to take pain medicine before doing incentive spirometry. It is harder to take a deep breath if you are having pain. How to use your incentive spirometer  Sit up on the edge of your bed or on a chair. Hold the incentive spirometer so that it is in an upright position. Before you use the spirometer, breathe out normally. Place the mouthpiece in your mouth. Make sure your lips are closed tightly around it. Breathe in slowly and as deeply as you can through your mouth, causing the piston or the ball to rise toward the top of the chamber. Hold your breath for 3-5 seconds, or for as long as possible. If the spirometer includes a coach indicator, use this to guide you in breathing. Slow down your breathing if the indicator goes above the marked areas. Remove the mouthpiece from your mouth and breathe out normally. The piston or ball will return to the bottom of  the chamber. Rest for a few seconds, then repeat the steps 10 or more times. Take your time and take a few normal breaths between deep breaths so that you do not get dizzy or light-headed. Do this every 1-2 hours when you are awake. If the spirometer includes a goal marker to show the highest number you have reached (best effort), use this as a goal to work toward during each repetition. After each set of 10 deep breaths, cough a few times. This will help to make sure that your lungs are clear. If you have an incision on your chest or abdomen from surgery, place a pillow or a rolled-up towel firmly against the incision when you cough. This can help to reduce pain while taking deep breaths and coughing. General tips When you are able to get out of bed: Walk around often. Continue to take deep breaths and cough in order to clear your lungs. Keep using the incentive spirometer until your health care provider says it is okay to stop using it. If you have been in the hospital, you may be told to keep using the spirometer at home. Contact a health care provider if: You are having  difficulty using the spirometer. You have trouble using the spirometer as often as instructed. Your pain medicine is not giving enough relief for you to use the spirometer as told. You have a fever. Get help right away if: You develop shortness of breath. You develop a cough with bloody mucus from the lungs. You have fluid or blood coming from an incision site after you cough. Summary An incentive spirometer is a tool that can help you learn to take long, deep breaths to keep your lungs clear and active. You may be asked to use a spirometer after a surgery, if you have a lung problem or a history of smoking, or if you have been inactive for a long period of time. Use your incentive spirometer as instructed every 1-2 hours while you are awake. If you have an incision on your chest or abdomen, place a pillow or a rolled-up  towel firmly against your incision when you cough. This will help to reduce pain. Get help right away if you have shortness of breath, you cough up bloody mucus, or blood comes from your incision when you cough. This information is not intended to replace advice given to you by your health care provider. Make sure you discuss any questions you have with your health care provider. Document Revised: 09/28/2023 Document Reviewed: 09/28/2023 Elsevier Patient Education  2024 [employer].

## 2024-06-16 NOTE — H&P (Signed)
 TOTAL KNEE ADMISSION H&P  Patient is being admitted for right total knee arthroplasty.  Subjective:  Chief Complaint:right knee pain.  HPI: Diana Skinner, 73 y.o. female, has a history of pain and functional disability in the right knee(s) due to arthritis and patient has failed non-surgical conservative treatments for greater than 12 weeks to include NSAID's and/or analgesics, corticosteriod injections, flexibility and strengthening excercises, use of assistive devices, and activity modification.  Onset of symptoms was gradual starting >10 years ago with gradually worsening course since that time. Patient currently rates pain in the right knee(s) at 7 out of 10 with activity. There is night pain, worsening of pain with activity and weight bearing, pain that interferes with activities of daily living, pain with passive range of motion, and crepitus.  Patient has evidence of joint space narrowing by imaging studies.   Patient Active Problem List   Diagnosis Date Noted   S/P total knee arthroplasty, left 03/25/24 04/08/2024   Primary osteoarthritis of left knee 03/25/2024   Fracture of right wrist 08/27/2016   Bursitis of knee 05/30/2011   KNEE PAIN 01/10/2010   CLOSED FRACTURE OF UPPER END OF TIBIA 01/10/2010   DERANGEMENT MENISCUS 01/03/2010   Past Medical History:  Diagnosis Date   Anxiety    Arthritis    Asthma    High cholesterol    HTN (hypertension)    Hypothyroidism    Migraines     Past Surgical History:  Procedure Laterality Date   CATARACT EXTRACTION W/PHACO Right 01/27/2019   Procedure: CATARACT EXTRACTION PHACO AND INTRAOCULAR LENS PLACEMENT (IOC);  Surgeon: Harrie Agent, MD;  Location: [facility];  Service: Ophthalmology;  Laterality: Right;  CDE: 8.33   CATARACT EXTRACTION W/PHACO Left 02/10/2019   Procedure: CATARACT EXTRACTION PHACO AND INTRAOCULAR LENS PLACEMENT (IOC);  Surgeon: Harrie Agent, MD;  Location: [facility];  Service: Ophthalmology;  Laterality: Left;  CDE:  5.49   CESAREAN SECTION     COLONOSCOPY  06/19/2012   Procedure: COLONOSCOPY;  Surgeon: Claudis RAYMOND Rivet, MD;  Location: [facility];  Service: Endoscopy;  Laterality: N/A;  1030   ORIF RADIAL FRACTURE Right 08/27/2016   Procedure: OPEN REDUCTION INTERNAL FIXATION (ORIF) RADIAL FRACTURE/DISTAL;  Surgeon: Balinda Rogue, MD;  Location: [facility];  Service: Plastics;  Laterality: Right;   TONSILLECTOMY AND ADENOIDECTOMY     TOTAL KNEE ARTHROPLASTY Left 03/25/2024   Procedure: ARTHROPLASTY, KNEE, TOTAL;  Surgeon: Margrette Taft BRAVO, MD;  Location: [facility];  Service: Orthopedics;  Laterality: Left;    Current Facility-Administered Medications  Medication Dose Route Frequency Provider Last Rate Last Admin   bupivacaine -meloxicam  ER (ZYNRELEF ) injection 400 mg  400 mg Infiltration Once Octavio Matheney E, MD       bupivacaine -meloxicam  ER (ZYNRELEF ) injection 400 mg  400 mg Infiltration Once Margrette Taft BRAVO, MD       Current Outpatient Medications  Medication Sig Dispense Refill Last Dose/Taking   aspirin  EC 325 MG tablet Take 1 tablet (325 mg total) by mouth daily. 100 tablet 3    diclofenac  (VOLTAREN ) 75 MG EC tablet TAKE ONE TABLET (75MG  TOTAL) BY MOUTH TWO TIMES DAILY 60 tablet 2    docusate sodium  (COLACE) 100 MG capsule Take 1 capsule (100 mg total) by mouth 2 (two) times daily. 10 capsule 0    levothyroxine  (SYNTHROID , LEVOTHROID) 50 MCG tablet Take 50 mcg by mouth daily before breakfast.   2    loratadine  (CLARITIN ) 10 MG tablet Take 10 mg by mouth daily.  losartan  (COZAAR ) 100 MG tablet Take 100 mg by mouth daily.      methocarbamol  (ROBAXIN ) 500 MG tablet Take 1 tablet (500 mg total) by mouth every 6 (six) hours as needed for muscle spasms. 56 tablet 2    Omega-3 Fatty Acids (FISH OIL) 1200 MG CAPS Take 1,200 mg by mouth daily.      Polyethyl Glycol-Propyl Glycol (SYSTANE OP) Apply 1 drop to eye 2 (two) times daily as needed (dry eyes).       polyethylene glycol (MIRALAX  / GLYCOLAX )  17 g packet Take 17 g by mouth daily as needed for mild constipation. 14 each 0    rosuvastatin  (CRESTOR ) 20 MG tablet Take 20 mg by mouth daily.      traMADol  (ULTRAM ) 50 MG tablet Take 1 tablet (50 mg total) by mouth every 6 (six) hours. 30 tablet 0    venlafaxine  XR (EFFEXOR -XR) 37.5 MG 24 hr capsule Take 37.5 mg by mouth daily with breakfast.   10    Allergies  Allergen Reactions   Sulfonamide Derivatives Swelling    Social History   Tobacco Use   Smoking status: Never   Smokeless tobacco: Never  Substance Use Topics   Alcohol  use: Yes    Comment: occasional - 6 beers per week    Family History  Problem Relation Age of Onset   Dementia Mother    Heart attack Brother    Heart attack Brother        x2   Heart disease Other    Breast cancer Neg Hx       Review of Systems  Constitutional:  Negative for fever.  Respiratory:  Negative for shortness of breath.   Cardiovascular:  Negative for chest pain.  Skin: Negative.      Objective:  Physical Exam Vitals and nursing note reviewed.  Constitutional:      General: She is not in acute distress.    Appearance: Normal appearance. She is normal weight. She is not ill-appearing, toxic-appearing or diaphoretic.  HENT:     Head: Normocephalic and atraumatic.     Right Ear: External ear normal.     Left Ear: External ear normal.     Nose: Nose normal. No congestion or rhinorrhea.     Mouth/Throat:     Pharynx: Oropharynx is clear. No oropharyngeal exudate.  Eyes:     General: No scleral icterus.       Right eye: No discharge.        Left eye: No discharge.     Extraocular Movements: Extraocular movements intact.     Conjunctiva/sclera: Conjunctivae normal.     Pupils: Pupils are equal, round, and reactive to light.  Cardiovascular:     Rate and Rhythm: Normal rate.     Pulses: Normal pulses.  Pulmonary:     Effort: Pulmonary effort is normal.     Breath sounds: No stridor. No wheezing or rhonchi.  Abdominal:      General: Abdomen is flat. There is no distension.  Musculoskeletal:     Cervical back: Neck supple.     Comments: Right knee is in varus alignment. Slight flexion contracture Arc of flexion 5 degrees - 120 degrees- Stability test are normal Muscle strength and tone normal Skin warm dry intact normal  Left total knee arthroplasty recently done functioning well    Skin:    General: Skin is warm and dry.     Capillary Refill: Capillary refill takes less than 2 seconds.  Neurological:     General: No focal deficit present.     Mental Status: She is alert and oriented to person, place, and time. Mental status is at baseline.     Cranial Nerves: No cranial nerve deficit.     Sensory: No sensory deficit.     Motor: No weakness.     Coordination: Coordination normal.     Gait: Gait abnormal.     Deep Tendon Reflexes: Reflexes normal.  Psychiatric:        Mood and Affect: Mood normal.        Behavior: Behavior normal.        Thought Content: Thought content normal.        Judgment: Judgment normal.     Vital signs in last 24 hours:    Labs:  Estimated body mass index is 25.83 kg/m as calculated from the following:   Height as of 06/10/24: 5' 7 (1.702 m).   Weight as of 06/10/24: 74.8 kg.  Imaging Review Plain radiographs demonstrate moderate degenerative joint disease of the right knee(s). The overall alignment is mild varus.. The bone quality appears to be good for age and reported activity level.     Assessment/Plan:  End stage arthritis, right knee. The patient history, physical examination, clinical judgment of the provider and imaging studies are consistent with end stage degenerative joint disease of the right knee(s),  The procedure has been fully reviewed with the patient; The risks and benefits of surgery have been discussed and explained and understood. Alternative treatment has also been reviewed, questions were encouraged and answered. The postoperative plan is  also been reviewed. The patient is planning to be discharged home with home health services

## 2024-06-17 ENCOUNTER — Ambulatory Visit (HOSPITAL_BASED_OUTPATIENT_CLINIC_OR_DEPARTMENT_OTHER): Admitting: Anesthesiology

## 2024-06-17 ENCOUNTER — Ambulatory Visit (HOSPITAL_COMMUNITY)

## 2024-06-17 ENCOUNTER — Other Ambulatory Visit: Payer: Self-pay

## 2024-06-17 ENCOUNTER — Encounter (HOSPITAL_COMMUNITY): Admitting: Anesthesiology

## 2024-06-17 ENCOUNTER — Observation Stay (HOSPITAL_COMMUNITY)
Admission: RE | Admit: 2024-06-17 | Discharge: 2024-06-18 | Disposition: A | Discharge status: Home Health | Attending: Orthopedic Surgery | Admitting: Orthopedic Surgery

## 2024-06-17 ENCOUNTER — Encounter (HOSPITAL_COMMUNITY): Payer: Self-pay | Admitting: Orthopedic Surgery

## 2024-06-17 ENCOUNTER — Encounter (HOSPITAL_COMMUNITY)
Admission: RE | Disposition: A | Discharge status: Home Health | Payer: Self-pay | Source: Home / Self Care | Attending: Orthopedic Surgery

## 2024-06-17 DIAGNOSIS — Z471 Aftercare following joint replacement surgery: Secondary | ICD-10-CM | POA: Diagnosis not present

## 2024-06-17 DIAGNOSIS — G8918 Other acute postprocedural pain: Secondary | ICD-10-CM | POA: Diagnosis not present

## 2024-06-17 DIAGNOSIS — T797XXA Traumatic subcutaneous emphysema, initial encounter: Secondary | ICD-10-CM | POA: Diagnosis not present

## 2024-06-17 DIAGNOSIS — M1711 Unilateral primary osteoarthritis, right knee: Secondary | ICD-10-CM | POA: Diagnosis not present

## 2024-06-17 DIAGNOSIS — I10 Essential (primary) hypertension: Secondary | ICD-10-CM | POA: Insufficient documentation

## 2024-06-17 DIAGNOSIS — Z96652 Presence of left artificial knee joint: Secondary | ICD-10-CM | POA: Diagnosis not present

## 2024-06-17 DIAGNOSIS — J45909 Unspecified asthma, uncomplicated: Secondary | ICD-10-CM | POA: Diagnosis not present

## 2024-06-17 DIAGNOSIS — E039 Hypothyroidism, unspecified: Secondary | ICD-10-CM | POA: Insufficient documentation

## 2024-06-17 DIAGNOSIS — Z79899 Other long term (current) drug therapy: Secondary | ICD-10-CM | POA: Insufficient documentation

## 2024-06-17 DIAGNOSIS — Z7982 Long term (current) use of aspirin: Secondary | ICD-10-CM | POA: Diagnosis not present

## 2024-06-17 DIAGNOSIS — Z96651 Presence of right artificial knee joint: Secondary | ICD-10-CM | POA: Diagnosis not present

## 2024-06-17 DIAGNOSIS — M7989 Other specified soft tissue disorders: Secondary | ICD-10-CM | POA: Diagnosis not present

## 2024-06-17 HISTORY — PX: TOTAL KNEE ARTHROPLASTY: SHX125

## 2024-06-17 LAB — CREATININE, SERUM
Creatinine, Ser: 0.75 mg/dL (ref 0.44–1.00)
GFR, Estimated: >60 mL/min (ref >60)

## 2024-06-17 LAB — CBC
HCT: 38.9 % (ref 36.0–46.0)
Hemoglobin: 12 g/dL (ref 12.0–15.0)
MCH: 29.1 pg (ref 26.0–34.0)
MCHC: 30.8 g/dL (ref 30.0–36.0)
MCV: 94.2 fL (ref 80.0–100.0)
Platelets: 225 K/uL (ref 150–400)
RBC: 4.13 MIL/uL (ref 3.87–5.11)
RDW: 13.2 % (ref 11.5–15.5)
WBC: 9.9 K/uL (ref 4.0–10.5)
nRBC: 0 % (ref 0.0–0.2)

## 2024-06-17 SURGERY — ARTHROPLASTY, KNEE, TOTAL
Anesthesia: Spinal | Site: Knee | Laterality: Right

## 2024-06-17 MED ORDER — LEVOTHYROXINE SODIUM 50 MCG PO TABS
50.0000 ug | ORAL_TABLET | Freq: Every day | ORAL | Status: DC
  Administered 2024-06-18: 50 ug via ORAL
  Filled 2024-06-17: qty 1

## 2024-06-17 MED ORDER — ACETAMINOPHEN 500 MG PO TABS
1000.0000 mg | ORAL_TABLET | Freq: Once | ORAL | Status: AC
  Administered 2024-06-17: 1000 mg via ORAL

## 2024-06-17 MED ORDER — ONDANSETRON HCL 4 MG/2ML IJ SOLN: 4.0000 mg | Freq: Four times a day (QID) | INTRAMUSCULAR | Status: DC | PRN

## 2024-06-17 MED ORDER — LACTATED RINGERS IV SOLN: INTRAVENOUS | Status: DC | PRN

## 2024-06-17 MED ORDER — PROPOFOL 10 MG/ML IV BOLUS
INTRAVENOUS | Status: DC | PRN
  Administered 2024-06-17: 20 mg via INTRAVENOUS

## 2024-06-17 MED ORDER — PROPOFOL 500 MG/50ML IV EMUL
INTRAVENOUS | Status: DC | PRN
  Administered 2024-06-17: 75 ug/kg/min via INTRAVENOUS

## 2024-06-17 MED ORDER — METHOCARBAMOL 500 MG PO TABS: 500.0000 mg | ORAL_TABLET | Freq: Four times a day (QID) | ORAL | Status: DC | PRN

## 2024-06-17 MED ORDER — BUPIVACAINE-MELOXICAM ER 200-6 MG/7ML IJ SOLN
INTRAMUSCULAR | Status: DC | PRN
  Administered 2024-06-17: 400 mg

## 2024-06-17 MED ORDER — DOCUSATE SODIUM 100 MG PO CAPS: 100.0000 mg | ORAL_CAPSULE | Freq: Two times a day (BID) | ORAL | Status: DC

## 2024-06-17 MED ORDER — MENTHOL 3 MG MT LOZG: 1.0000 | LOZENGE | OROMUCOSAL | Status: DC | PRN

## 2024-06-17 MED ORDER — CEFAZOLIN SODIUM-DEXTROSE 2-4 GM/100ML-% IV SOLN
2.0000 g | Freq: Four times a day (QID) | INTRAVENOUS | Status: AC
  Administered 2024-06-17 – 2024-06-18 (×2): 2 g via INTRAVENOUS
  Filled 2024-06-17 (×2): qty 100

## 2024-06-17 MED ORDER — ACETAMINOPHEN 500 MG PO TABS
1000.0000 mg | ORAL_TABLET | Freq: Four times a day (QID) | ORAL | Status: DC
  Filled 2024-06-17: qty 2

## 2024-06-17 MED ORDER — GABAPENTIN 100 MG PO CAPS
100.0000 mg | ORAL_CAPSULE | Freq: Once | ORAL | Status: AC
  Administered 2024-06-17: 100 mg via ORAL
  Filled 2024-06-17: qty 1

## 2024-06-17 MED ORDER — MIDAZOLAM HCL 5 MG/5ML IJ SOLN
INTRAMUSCULAR | Status: DC | PRN
  Administered 2024-06-17 (×2): 1 mg via INTRAVENOUS

## 2024-06-17 MED ORDER — LACTATED RINGERS IV SOLN: INTRAVENOUS | Status: DC

## 2024-06-17 MED ORDER — OXYCODONE HCL 5 MG PO TABS
5.0000 mg | ORAL_TABLET | Freq: Once | ORAL | Status: DC | PRN
  Filled 2024-06-17: qty 1

## 2024-06-17 MED ORDER — LIDOCAINE 2% (20 MG/ML) 5 ML SYRINGE
INTRAMUSCULAR | Status: DC | PRN
  Administered 2024-06-17: 30 mg via INTRAVENOUS

## 2024-06-17 MED ORDER — METOCLOPRAMIDE HCL 5 MG/ML IJ SOLN: 5.0000 mg | Freq: Three times a day (TID) | INTRAMUSCULAR | Status: DC | PRN

## 2024-06-17 MED ORDER — MORPHINE SULFATE (PF) 2 MG/ML IV SOLN: 0.5000 mg | INTRAVENOUS | Status: DC | PRN

## 2024-06-17 MED ORDER — TRANEXAMIC ACID-NACL 1000-0.7 MG/100ML-% IV SOLN
1000.0000 mg | INTRAVENOUS | Status: AC
  Administered 2024-06-17: 1000 mg via INTRAVENOUS
  Filled 2024-06-17: qty 100

## 2024-06-17 MED ORDER — ENOXAPARIN SODIUM 30 MG/0.3ML IJ SOSY
30.0000 mg | PREFILLED_SYRINGE | INTRAMUSCULAR | Status: DC
  Filled 2024-06-17: qty 0.3

## 2024-06-17 MED ORDER — DEXAMETHASONE SODIUM PHOSPHATE 10 MG/ML IJ SOLN
10.0000 mg | Freq: Once | INTRAMUSCULAR | Status: AC
  Administered 2024-06-18: 10 mg via INTRAVENOUS
  Filled 2024-06-17: qty 1

## 2024-06-17 MED ORDER — HYDROCODONE-ACETAMINOPHEN 7.5-325 MG PO TABS: 1.0000 | ORAL_TABLET | ORAL | Status: DC | PRN

## 2024-06-17 MED ORDER — DEXAMETHASONE SODIUM PHOSPHATE 10 MG/ML IJ SOLN
INTRAMUSCULAR | Status: AC
Start: 2024-06-17 — End: 2024-06-17
  Filled 2024-06-17: qty 1

## 2024-06-17 MED ORDER — ROSUVASTATIN CALCIUM 20 MG PO TABS
20.0000 mg | ORAL_TABLET | Freq: Every day | ORAL | Status: DC
  Administered 2024-06-17 – 2024-06-18 (×2): 20 mg via ORAL
  Filled 2024-06-17 (×2): qty 1

## 2024-06-17 MED ORDER — PHENOL 1.4 % MT LIQD: 1.0000 | OROMUCOSAL | Status: DC | PRN

## 2024-06-17 MED ORDER — ACETAMINOPHEN 160 MG/5ML PO SOLN
960.0000 mg | Freq: Once | ORAL | Status: AC
  Filled 2024-06-17: qty 30

## 2024-06-17 MED ORDER — DEXAMETHASONE SODIUM PHOSPHATE 4 MG/ML IJ SOLN
INTRAMUSCULAR | Status: DC | PRN
  Administered 2024-06-17: 8 mg via INTRAVENOUS

## 2024-06-17 MED ORDER — ACETAMINOPHEN 500 MG PO TABS
500.0000 mg | ORAL_TABLET | Freq: Four times a day (QID) | ORAL | Status: AC
  Administered 2024-06-17 – 2024-06-18 (×4): 500 mg via ORAL
  Filled 2024-06-17 (×2): qty 1

## 2024-06-17 MED ORDER — ROPIVACAINE HCL 5 MG/ML IJ SOLN
INTRAMUSCULAR | Status: AC
  Filled 2024-06-17: qty 30

## 2024-06-17 MED ORDER — OMEGA-3-ACID ETHYL ESTERS 1 G PO CAPS
1.0000 g | ORAL_CAPSULE | Freq: Every day | ORAL | Status: DC
  Administered 2024-06-18: 1 g via ORAL
  Filled 2024-06-17: qty 1

## 2024-06-17 MED ORDER — TRAMADOL HCL 50 MG PO TABS
50.0000 mg | ORAL_TABLET | Freq: Four times a day (QID) | ORAL | Status: DC
  Administered 2024-06-17 – 2024-06-18 (×4): 50 mg via ORAL
  Filled 2024-06-17 (×4): qty 1

## 2024-06-17 MED ORDER — SODIUM CHLORIDE 0.9 % IR SOLN
Status: DC | PRN
Start: 2024-06-17 — End: 2024-06-17
  Administered 2024-06-17: 3000 mL

## 2024-06-17 MED ORDER — ONDANSETRON HCL 4 MG/2ML IJ SOLN
INTRAMUSCULAR | Status: DC | PRN
  Administered 2024-06-17: 4 mg via INTRAVENOUS

## 2024-06-17 MED ORDER — POLYETHYLENE GLYCOL 3350 17 G PO PACK: 17.0000 g | PACK | Freq: Every day | ORAL | Status: DC | PRN

## 2024-06-17 MED ORDER — DOCUSATE SODIUM 100 MG PO CAPS
100.0000 mg | ORAL_CAPSULE | Freq: Two times a day (BID) | ORAL | Status: DC
  Administered 2024-06-17 – 2024-06-18 (×2): 100 mg via ORAL
  Filled 2024-06-17 (×2): qty 1

## 2024-06-17 MED ORDER — ACETAMINOPHEN 500 MG PO TABS
ORAL_TABLET | ORAL | Status: AC
  Filled 2024-06-17: qty 2

## 2024-06-17 MED ORDER — HYDROMORPHONE HCL 1 MG/ML IJ SOLN: 0.2500 mg | INTRAMUSCULAR | Status: DC | PRN

## 2024-06-17 MED ORDER — KETOROLAC TROMETHAMINE 30 MG/ML IJ SOLN
15.0000 mg | Freq: Once | INTRAMUSCULAR | Status: AC
  Administered 2024-06-17: 15 mg via INTRAVENOUS
  Filled 2024-06-17: qty 1

## 2024-06-17 MED ORDER — OXYCODONE HCL 5 MG/5ML PO SOLN: 5.0000 mg | Freq: Once | ORAL | Status: DC | PRN

## 2024-06-17 MED ORDER — LIDOCAINE 2% (20 MG/ML) 5 ML SYRINGE
INTRAMUSCULAR | Status: AC
  Filled 2024-06-17: qty 20

## 2024-06-17 MED ORDER — CHLORHEXIDINE GLUCONATE 0.12 % MT SOLN
15.0000 mL | Freq: Once | OROMUCOSAL | Status: AC
  Administered 2024-06-17: 15 mL via OROMUCOSAL

## 2024-06-17 MED ORDER — LORATADINE 10 MG PO TABS
10.0000 mg | ORAL_TABLET | Freq: Every day | ORAL | Status: DC
  Administered 2024-06-17 – 2024-06-18 (×2): 10 mg via ORAL
  Filled 2024-06-17 (×2): qty 1

## 2024-06-17 MED ORDER — PROPOFOL 500 MG/50ML IV EMUL
INTRAVENOUS | Status: AC
  Filled 2024-06-17: qty 150

## 2024-06-17 MED ORDER — CEFAZOLIN SODIUM-DEXTROSE 2-4 GM/100ML-% IV SOLN
2.0000 g | INTRAVENOUS | Status: AC
  Administered 2024-06-17: 2 g via INTRAVENOUS
  Filled 2024-06-17: qty 100

## 2024-06-17 MED ORDER — BUPIVACAINE IN DEXTROSE 0.75-8.25 % IT SOLN
INTRATHECAL | Status: DC | PRN
  Administered 2024-06-17: 1.8 mL via INTRATHECAL

## 2024-06-17 MED ORDER — VENLAFAXINE HCL ER 37.5 MG PO CP24
37.5000 mg | ORAL_CAPSULE | Freq: Every day | ORAL | Status: DC
  Administered 2024-06-18: 37.5 mg via ORAL
  Filled 2024-06-17 (×2): qty 1

## 2024-06-17 MED ORDER — TRANEXAMIC ACID-NACL 1000-0.7 MG/100ML-% IV SOLN
1000.0000 mg | Freq: Once | INTRAVENOUS | Status: AC
Start: 2024-06-17 — End: 2024-06-17
  Administered 2024-06-17: 1000 mg via INTRAVENOUS
  Filled 2024-06-17: qty 100

## 2024-06-17 MED ORDER — BUPIVACAINE-MELOXICAM ER 200-6 MG/7ML IJ SOLN
INTRAMUSCULAR | Status: AC
  Filled 2024-06-17: qty 2

## 2024-06-17 MED ORDER — DEXAMETHASONE SODIUM PHOSPHATE 10 MG/ML IJ SOLN: 8.0000 mg | Freq: Once | INTRAMUSCULAR | Status: DC

## 2024-06-17 MED ORDER — MIDAZOLAM HCL 2 MG/2ML IJ SOLN
INTRAMUSCULAR | Status: AC
  Filled 2024-06-17: qty 2

## 2024-06-17 MED ORDER — PHENYLEPHRINE HCL-NACL 20-0.9 MG/250ML-% IV SOLN
INTRAVENOUS | Status: DC | PRN
  Administered 2024-06-17: 25 ug/min via INTRAVENOUS

## 2024-06-17 MED ORDER — ONDANSETRON HCL 4 MG PO TABS: 4.0000 mg | ORAL_TABLET | Freq: Four times a day (QID) | ORAL | Status: DC | PRN

## 2024-06-17 MED ORDER — PROPOFOL 10 MG/ML IV BOLUS
INTRAVENOUS | Status: AC
Start: 2024-06-17 — End: 2024-06-17
  Filled 2024-06-17: qty 20

## 2024-06-17 MED ORDER — POLYVINYL ALCOHOL 1.4 % OP SOLN: Freq: Two times a day (BID) | OPHTHALMIC | Status: DC | PRN

## 2024-06-17 MED ORDER — LOSARTAN POTASSIUM 50 MG PO TABS
100.0000 mg | ORAL_TABLET | Freq: Every day | ORAL | Status: DC
  Administered 2024-06-17 – 2024-06-18 (×2): 100 mg via ORAL
  Filled 2024-06-17 (×2): qty 2

## 2024-06-17 MED ORDER — 0.9 % SODIUM CHLORIDE (POUR BTL) OPTIME
TOPICAL | Status: DC | PRN
  Administered 2024-06-17: 1000 mL

## 2024-06-17 MED ORDER — STERILE WATER FOR IRRIGATION IR SOLN
Status: DC | PRN
  Administered 2024-06-17: 1000 mL

## 2024-06-17 MED ORDER — SODIUM CHLORIDE 0.9 % IV SOLN: 12.5000 mg | INTRAVENOUS | Status: DC | PRN

## 2024-06-17 MED ORDER — HYDROCODONE-ACETAMINOPHEN 5-325 MG PO TABS: 1.0000 | ORAL_TABLET | ORAL | Status: DC | PRN

## 2024-06-17 MED ORDER — DEXTROSE-SODIUM CHLORIDE 5-0.45 % IV SOLN: INTRAVENOUS | Status: DC

## 2024-06-17 MED ORDER — ONDANSETRON HCL 4 MG/2ML IJ SOLN
INTRAMUSCULAR | Status: AC
  Filled 2024-06-17: qty 2

## 2024-06-17 MED ORDER — METOCLOPRAMIDE HCL 10 MG PO TABS: 5.0000 mg | ORAL_TABLET | Freq: Three times a day (TID) | ORAL | Status: DC | PRN

## 2024-06-17 MED ORDER — ORAL CARE MOUTH RINSE: 15.0000 mL | Freq: Once | OROMUCOSAL | Status: AC

## 2024-06-17 MED ORDER — POVIDONE-IODINE 10 % EX SWAB
2.0000 | Freq: Once | CUTANEOUS | Status: AC
  Administered 2024-06-17: 2 via TOPICAL

## 2024-06-17 SURGICAL SUPPLY — 55 items
ATTUNE PS FEM RT SZ 4 CEM KNEE (Femur) IMPLANT
BANDAGE ESMARK 6X9 LF (GAUZE/BANDAGES/DRESSINGS) ×1 IMPLANT
BASEPLATE TIB CMT FB PCKT SZ5 (Knees) IMPLANT
BLADE SAGITTAL 25.0X1.27X90 (BLADE) ×1 IMPLANT
BLADE SAW SGTL 11.0X1.19X90.0M (BLADE) ×1 IMPLANT
CATH FOLEY 2WAY SLVR 5CC 14FR (CATHETERS) IMPLANT
CEMENT HV SMART SET (Cement) ×2 IMPLANT
CLOTH BEACON ORANGE TIMEOUT ST (SAFETY) ×1 IMPLANT
COOLER ICEMAN CLASSIC (MISCELLANEOUS) ×1 IMPLANT
COUNTER NDL MAGNETIC 40 RED (SET/KITS/TRAYS/PACK) ×1 IMPLANT
COUNTER NEEDLE MAGNETIC 40 RED (SET/KITS/TRAYS/PACK) ×1 IMPLANT
COVER LIGHT HANDLE STERIS (MISCELLANEOUS) ×2 IMPLANT
CUFF TRNQT CYL 34X4.125X (TOURNIQUET CUFF) ×1 IMPLANT
DRAPE BACK TABLE (DRAPES) ×1 IMPLANT
DRAPE EXTREMITY T 121X128X90 (DISPOSABLE) ×1 IMPLANT
DRESSING AQUACEL AG ADV 3.5X12 (MISCELLANEOUS) ×1 IMPLANT
DURAPREP 26ML APPLICATOR (WOUND CARE) ×2 IMPLANT
ELECTRODE REM PT RTRN 9FT ADLT (ELECTROSURGICAL) ×1 IMPLANT
GLOVE BIO SURGEON STRL SZ7 (GLOVE) IMPLANT
GLOVE BIOGEL PI IND STRL 7.0 (GLOVE) ×6 IMPLANT
GLOVE BIOGEL PI IND STRL 8.5 (GLOVE) ×1 IMPLANT
GLOVE SKINSENSE STRL SZ8.0 LF (GLOVE) ×1 IMPLANT
GOWN STRL REUS W/TWL LRG LVL3 (GOWN DISPOSABLE) ×3 IMPLANT
GOWN STRL REUS W/TWL XL LVL3 (GOWN DISPOSABLE) ×1 IMPLANT
HOOD PEEL AWAY T7 (MISCELLANEOUS) ×4 IMPLANT
INSERT TIB ATTUNE FB SZ4X6 (Insert) IMPLANT
INST SET MAJOR BONE (KITS) ×1 IMPLANT
KIT TURNOVER KIT A (KITS) ×1 IMPLANT
MANIFOLD NEPTUNE II (INSTRUMENTS) ×1 IMPLANT
MARKER SKIN DUAL TIP RULER LAB (MISCELLANEOUS) ×1 IMPLANT
NS IRRIG 1000ML POUR BTL (IV SOLUTION) ×1 IMPLANT
PACK TOTAL JOINT (CUSTOM PROCEDURE TRAY) ×1 IMPLANT
PAD ARMBOARD POSITIONER FOAM (MISCELLANEOUS) ×1 IMPLANT
PAD COLD SHLDR SM WRAP-ON (PAD) ×1 IMPLANT
PATELLA MEDIAL ATTUN 35MM KNEE (Knees) IMPLANT
PENCIL SMOKE EVACUATOR COATED (MISCELLANEOUS) ×1 IMPLANT
PILLOW KNEE EXTENSION 0 DEG (MISCELLANEOUS) ×1 IMPLANT
PIN STEINMAN FIXATION KNEE (PIN) ×1 IMPLANT
POSITIONER HEAD 8X9X4 ADT (SOFTGOODS) ×1 IMPLANT
SAW OSC TIP CART 19.5X105X1.3 (SAW) ×1 IMPLANT
SET BASIN LINEN APH (SET/KITS/TRAYS/PACK) ×1 IMPLANT
SET HNDPC FAN SPRY TIP SCT (DISPOSABLE) ×1 IMPLANT
SOL .9 NS 3000ML IRR UROMATIC (IV SOLUTION) ×1 IMPLANT
SOLUTION IRRIG SURGIPHOR (IV SOLUTION) ×1 IMPLANT
STAPLER SKIN PROX WIDE 3.9 (STAPLE) ×1 IMPLANT
SUT BRALON NAB BRD #1 30IN (SUTURE) ×1 IMPLANT
SUT MNCRL 0 VIOLET CTX 36 (SUTURE) ×1 IMPLANT
SUT MON AB 0 CT1 (SUTURE) ×1 IMPLANT
SYR BULB IRRIG 60ML STRL (SYRINGE) ×1 IMPLANT
TOWEL OR 17X26 4PK STRL BLUE (TOWEL DISPOSABLE) ×1 IMPLANT
TOWER CARTRIDGE SMART MIX (DISPOSABLE) ×1 IMPLANT
TRAY FOLEY MTR SLVR 16FR STAT (SET/KITS/TRAYS/PACK) ×1 IMPLANT
TUBING CONNECTOR 18X5MM (MISCELLANEOUS) ×1 IMPLANT
WATER STERILE IRR 1000ML POUR (IV SOLUTION) ×2 IMPLANT
YANKAUER SUCT 12FT TUBE ARGYLE (SUCTIONS) ×1 IMPLANT

## 2024-06-17 NOTE — Evaluation (Signed)
 Physical Therapy Evaluation Patient Details Name: Diana Skinner MRN: [id number] DOB: March 22, 1951 Today's Date: 06/17/2024   RIGHT  KNEE ROM:  110 degrees AMBULATION DISTANCE: 100 feet using RW with Supervision/Modified Independence   History of Present Illness  Diana Skinner is 73 y/o female, s/p Right TKA on 06/17/24, with the diagnosis with the diagnosis of osteoarthritis Right knee..  Clinical Impression  Patient demonstrates good return for completing exercises, transfers and ambulation in room, hallway without loss of balance using RW. Patient limited mostly due to bleeding from right knee - nurse notified and patient put back to bed with RLE elevated. Patient will benefit from continued skilled physical therapy in hospital and recommended venue below to increase strength, balance, endurance for safe ADLs and gait.         If plan is discharge home, recommend the following: A little help with walking and/or transfers;A little help with bathing/dressing/bathroom;Help with stairs or ramp for entrance;Assistance with cooking/housework   Can travel by private vehicle        Equipment Recommendations None recommended by PT  Recommendations for Other Services       Functional Status Assessment Patient has had a recent decline in their functional status and demonstrates the ability to make significant improvements in function in a reasonable and predictable amount of time.     Precautions / Restrictions Precautions Precautions: Fall Recall of Precautions/Restrictions: Intact Restrictions Weight Bearing Restrictions Per Provider Order: Yes RLE Weight Bearing Per Provider Order: Weight bearing as tolerated      Mobility  Bed Mobility Overal bed mobility: Needs Assistance Bed Mobility: Supine to Sit     Supine to sit: Supervision     General bed mobility comments: slightly labored movement    Transfers Overall transfer level: Needs assistance Equipment used: Rolling  walker (2 wheels) Transfers: Sit to/from Stand, Bed to chair/wheelchair/BSC Sit to Stand: Supervision, Modified independent (Device/Increase time)   Step pivot transfers: Supervision, Modified independent (Device/Increase time)       General transfer comment: good return for transferring to/from bed    Ambulation/Gait Ambulation/Gait assistance: Modified independent (Device/Increase time), Supervision Gait Distance (Feet): 100 Feet Assistive device: Rolling walker (2 wheels) Gait Pattern/deviations: Decreased step length - left, Decreased stance time - right, Decreased step length - right, Antalgic Gait velocity: decreased     General Gait Details: slightly labored movment with fair/good return for right heel to toe stepping without loss of balance, limited mostly due to bleeding from right knee  Stairs            Wheelchair Mobility     Tilt Bed    Modified Rankin (Stroke Patients Only)       Balance Overall balance assessment: Needs assistance Sitting-balance support: Feet supported, No upper extremity supported Sitting balance-Leahy Scale: Good Sitting balance - Comments: seated at EOB   Standing balance support: During functional activity, Bilateral upper extremity supported Standing balance-Leahy Scale: Fair Standing balance comment: fair/good using RW                             Pertinent Vitals/Pain Pain Assessment Pain Assessment: Faces Faces Pain Scale: Hurts a little bit Pain Location: right knee Pain Descriptors / Indicators: Sore, Discomfort Pain Intervention(s): Limited activity within patient's tolerance, Monitored during session, Repositioned    Home Living Family/patient expects to be discharged to:: Private residence Living Arrangements: Spouse/significant other Available Help at Discharge: Family;Available 24 hours/day Type of Home:  House Home Access: Stairs to enter Entrance Stairs-Rails: Doctor, general practice  of Steps: 5   Home Layout: One level Home Equipment: Agricultural consultant (2 wheels);Cane - single point;Grab bars - tub/shower;BSC/3in1;Shower seat      Prior Function Prior Level of Function : Independent/Modified Independent;Driving             Mobility Comments: Community ambulation without AD, drives, shops ADLs Comments: Independent     Extremity/Trunk Assessment   Upper Extremity Assessment Upper Extremity Assessment: Overall WFL for tasks assessed    Lower Extremity Assessment Lower Extremity Assessment: Overall WFL for tasks assessed;RLE deficits/detail RLE Deficits / Details: grossly -4/5 RLE Sensation: WNL RLE Coordination: WNL    Cervical / Trunk Assessment Cervical / Trunk Assessment: Normal  Communication   Communication Communication: No apparent difficulties    Cognition Arousal: Alert Behavior During Therapy: WFL for tasks assessed/performed   PT - Cognitive impairments: No apparent impairments                         Following commands: Intact       Cueing Cueing Techniques: Verbal cues, Tactile cues     General Comments      Exercises Total Joint Exercises Ankle Circles/Pumps: Supine, 5 reps, Strengthening, AROM, Right Quad Sets: AROM, Strengthening, Right, 5 reps, Supine Short Arc Quad: AROM, Strengthening, Right, 5 reps, Supine Heel Slides: AROM, Strengthening, Right, 5 reps, Supine Goniometric ROM: RIGHT KNEE ROM: 0 - 110 degrees   Assessment/Plan    PT Assessment Patient needs continued PT services  PT Problem List Decreased activity tolerance;Decreased balance;Decreased mobility;Decreased strength;Pain       PT Treatment Interventions DME instruction;Gait training;Stair training;Functional mobility training;Therapeutic activities;Therapeutic exercise;Balance training;Patient/family education    PT Goals (Current goals can be found in the Care Plan section)  Acute Rehab PT Goals Patient Stated Goal: return home with  family to assist PT Goal Formulation: With patient Time For Goal Achievement: 06/19/24 Potential to Achieve Goals: Good    Frequency BID     Co-evaluation               AM-PAC PT 6 Clicks Mobility  Outcome Measure Help needed turning from your back to your side while in a flat bed without using bedrails?: None Help needed moving from lying on your back to sitting on the side of a flat bed without using bedrails?: None Help needed moving to and from a bed to a chair (including a wheelchair)?: A Little Help needed standing up from a chair using your arms (e.g., wheelchair or bedside chair)?: A Little Help needed to walk in hospital room?: A Little Help needed climbing 3-5 steps with a railing? : A Little 6 Click Score: 20    End of Session   Activity Tolerance: Patient tolerated treatment well Patient left: in bed;with call bell/phone within reach Nurse Communication: Mobility status PT Visit Diagnosis: Unsteadiness on feet (R26.81);Other abnormalities of gait and mobility (R26.89);Muscle weakness (generalized) (M62.81)    Time: 1535-1600 PT Time Calculation (min) (ACUTE ONLY): 25 min   Charges:   PT Evaluation $PT Eval Moderate Complexity: 1 Mod PT Treatments $Therapeutic Activity: 23-37 mins PT General Charges $$ ACUTE PT VISIT: 1 Visit         4:19 PM, 06/17/24 Lynwood Music, MPT Physical Therapist with [facility] [phone] office [phone] mobile phone

## 2024-06-17 NOTE — Brief Op Note (Signed)
 Dictation for total knee replacement  Orthopaedic Surgery Operative Note (CSN: [id number])  Diana Skinner  1950/12/28 Date of Surgery: 06/17/2024   Diagnoses:  osteoarthritis Right knee  Procedure: RIGHT TKA    TXA [used/not used]YES   Operative Finding Normal bone appearance of the medial femoral condyle with complete loss of cartilage.  The medial meniscus was still intact though some of the tissue had been either removed or degenerated.  Plateau on the medial side was also degenerative.  Lateral compartment grade 1 and 2 changes.  Patellofemoral joint grade 4 changes.  ACL PCL intact.  Lateral meniscus intact.  Preop flexion contracture 5 to 7 degrees.  Flexion 135 degrees.  Bone cuts Distal femur -Block set for  9 millimeters initial cut was 8 2 additional millimeters added by moving the block down  Proximal tibia -set for 9 mm  Patella -removed 9.5 mm total  Post-Op Diagnosis: Same Surgeons:Primary: Margrette Taft BRAVO, MD Assistants: Montie Eke Location: [facility] OR ROOM 4 Anesthesia: Spinal Antibiotics: Ancef  Tourniquet time: See Estimated Blood Loss: 50 cc Complications: None Specimens: None   Implants: Implant Name Type Inv. Item Serial No. Manufacturer Lot No. LRB No. Used Action  CEMENT HV SMART SET - [id number] Cement CEMENT HV SMART SET  [employer] [id number] Right 2 Implanted  PATELLA MEDIAL ATTUN KNEE - [id number] Knees PATELLA MEDIAL ATTUN KNEE  DEPU[employer] ORTH[employer] [id number] Right 1 Implanted  ATTUNE PS FEM RT SZ 4 CEM KNEE - [id number] Femur ATTUNE PS FEM RT SZ 4 CEM KNEE  DEPU[employer] ORTH[employer] [id number] Right 1 Implanted  BASEPLATE TIB CMT FB PCKT SZ5 - [id number] Knees BASEPLATE TIB CMT FB PCKT SZ5  [employer]PU[employer] ORTH[employer] [id number] Right 1 Implanted  INSERT TIB ATTUNE FB [id number] - [id number] Insert INSERT TIB ATTUNE FB [id number]  [employer]PU[employer] ORTH[employer] [id number] Right 1 Implanted     Indications for Surgery:   Disabling pain of the right knee which did  not improve despite nonoperative measures. The benefits and risks of operative and nonoperative management were discussed prior to surgery with patient/guardian(s) and informed consent form was completed.  While all risks cannot be anticipated, specific risks including infection, need for additional surgery, stiffness, postop pain, infection, implant removal, loosening, infection requiring amputation, deep vein thrombosis, pulmonary embolus were discussed.   Procedure:    Details of surgery: The patient was identified by 2 approved identification mechanisms. The operative extremity was evaluated and found to be acceptable for surgical treatment today. The chart was reviewed. The surgical site was confirmed and marked over the  RIGHT knee   Saphenous nerve block was planned and completed in the PACU yes [Y/N]   The patient was taken to the operating room and given 2 g Ancef  antibiotic.  This is consistent with the SCIP protocol.  The patient was given the following anesthetic: Spinal  The patient was then placed supine on the operating table. A Foley catheter was inserted. The operative extremity was prepped and draped sterilely from the toes to the groin.  Timeout was executed confirming the patient's name, surgical site, antibiotic administration, x-rays available, and implants available.  The operative limb,  was exsanguinated with a six-inch Esmarch and the tourniquet was inflated to 280 mmHg.  A straight midline incision was made over the right KNEE and taken down to the extensor mechanism. A medial arthrotomy was performed. The patella was everted and the patellofemoral soft tissue was released, along with the patellar fat pad.  The anterior cruciate ligament and PCL  were resected. The anterior horns of the lateral and medial meniscus were resected. The medial soft tissue sleeve was elevated to the mid coronal plane.   A three-eighths inch drill bit was used to enter the femoral canal  which was decompressed with suction and irrigation until clear.  The distal femoral cutting guide was set for 9 mm distal resection,  5valgus alignment, for a right knee. The distal femur was resected and checked for flatness.  The distal resection was measured at 8 mm of the block was moved back 2 mm The attune sizing femoral guide was placed and the femur was preliminarily sized to a size 4.    The external alignment guide for the tibial resection was then applied to the distal and proximal tibia and set for 3 degree posterior slope along with 9 MM resection  from the lateral.  Measured resection with caliper was 10 mm Rotational alignment was set using the malleolus, the tibial tubercle and the tibial spines. The proximal tibia was resected along with  residual menisci. The tibia was sized using a base plate to a size  5 .    The extension gap was checked.  The spacer block which balanced extension gap was a size 5  The femur size was rechecked and measured a 4 The femoral cutting block was placed.  The spacer block which balanced extension gap was placed under the 4-in-1 femoral cutting block.  An assessment was made at this point as to whether the block should be moved up or down or left in place.   The block was moved down [Y/N] NO.  The size 6 spacer block balance the flexion gap.  Rotation was assessed using Whitesides line and the epicondyles.  Once I was satisfied with the spacer block collateral ligament retractors were placed and I completed the 4 distal femoral cuts   The extension gap was rechecked with the size 6 spacer block.  The flexion and extension gaps were now balanced and I proceeded to cut the notch in the femur. The correct sized notch cutting guide for the femur was then applied and the notch cut was made.   Trial reduction was completed using size size 5 tibia size 4 femur size 6 poly trial trial implants. Patella tracking was normal  We then skeletonized the  patella. It measured 23 in thickness and the patellar resection was set for 9.5 millimeters. the patellar resection was completed by hand as the first cut only took 8.5 mm. The patella diameter measured 35. We then drilled the peg holes for the patella.    The proximal tibia was prepared using the size 5 base plate.  Thorough irrigation was performed using saline  and the bone was dried and prepared for cement. The cement was mixed on the back table using third generation preparation techniques  The implants were then cemented in place and excess cement was removed. The cement was allowed to cure.  Normal saline irrigation followed by Surgipor irrigation followed by normal saline irrigation  Any excess bone fragments and cement were removed.  Closure was done with the following sutures  #1 Braylon  Interrupted 0 Monocryl  Running 0 Monocryl  Skin approximation was performed using staples  A sterile dressing was applied, TED hose were placed on the operative extremity followed by Cryo/Cuff.  The patient was taken recovery room in stable condition  Postop plan: Weightbearing as tolerated CPM machine Immediate physical therapy Discharge tomorrow if stable

## 2024-06-17 NOTE — Anesthesia Postprocedure Evaluation (Signed)
 Anesthesia Post Note  Patient: Diana Skinner  Procedure(s) Performed: ARTHROPLASTY, KNEE, TOTAL (Right: Knee)  Patient location during evaluation: PACU Anesthesia Type: Spinal Level of consciousness: oriented, awake and alert, awake and patient cooperative Pain management: pain level controlled Vital Signs Assessment: post-procedure vital signs reviewed and stable Respiratory status: spontaneous breathing, respiratory function stable, nonlabored ventilation and patient connected to nasal cannula oxygen Cardiovascular status: blood pressure returned to baseline and stable Postop Assessment: no headache, no backache, no apparent nausea or vomiting, able to ambulate and spinal receding Anesthetic complications: no   There were no known notable events for this encounter.   Last Vitals:  Vitals:   06/17/24 1400 06/17/24 1415  BP: (!) 119/94 105/78  Pulse: 67 65  Resp: 13 15  Temp:    SpO2: 95% 93%    Last Pain:  Vitals:   06/17/24 1355  PainSc: 0-No pain                 Sahid Borba L Chong Wojdyla

## 2024-06-17 NOTE — Transfer of Care (Signed)
 Immediate Anesthesia Transfer of Care Note  Patient: Diana Skinner  Procedure(s) Performed: ARTHROPLASTY, KNEE, TOTAL (Right: Knee)  Patient Location: PACU  Anesthesia Type:Spinal  Level of Consciousness: awake, alert , and oriented  Airway & Oxygen Therapy: Patient Spontanous Breathing  Post-op Assessment: Report given to RN and Post -op Vital signs reviewed and stable  Post vital signs: Reviewed and stable  Last Vitals:  Vitals Value Taken Time  BP 113/76   Temp 97.6   Pulse 74 06/17/24 12:49  Resp 10   SpO2 98 % 06/17/24 12:49  Vitals shown include unfiled device data.  Last Pain:  Vitals:   06/17/24 0844  PainSc: 0-No pain         Complications: No notable events documented.

## 2024-06-17 NOTE — Op Note (Signed)
 Dictation for total knee replacement  Orthopaedic Surgery Operative Note (CSN: [id number])  Diana Skinner  1950/12/28 Date of Surgery: 06/17/2024   Diagnoses:  osteoarthritis Right knee  Procedure: RIGHT TKA    TXA [used/not used]YES   Operative Finding Normal bone appearance of the medial femoral condyle with complete loss of cartilage.  The medial meniscus was still intact though some of the tissue had been either removed or degenerated.  Plateau on the medial side was also degenerative.  Lateral compartment grade 1 and 2 changes.  Patellofemoral joint grade 4 changes.  ACL PCL intact.  Lateral meniscus intact.  Preop flexion contracture 5 to 7 degrees.  Flexion 135 degrees.  Bone cuts Distal femur -Block set for  9 millimeters initial cut was 8 2 additional millimeters added by moving the block down  Proximal tibia -set for 9 mm  Patella -removed 9.5 mm total  Post-Op Diagnosis: Same Surgeons:Primary: Margrette Taft BRAVO, MD Assistants: Montie Eke Location: [facility] OR ROOM 4 Anesthesia: Spinal Antibiotics: Ancef  Tourniquet time: See Estimated Blood Loss: 50 cc Complications: None Specimens: None   Implants: Implant Name Type Inv. Item Serial No. Manufacturer Lot No. LRB No. Used Action  CEMENT HV SMART SET - [id number] Cement CEMENT HV SMART SET  [employer] [id number] Right 2 Implanted  PATELLA MEDIAL ATTUN KNEE - [id number] Knees PATELLA MEDIAL ATTUN KNEE  DEPU[employer] ORTH[employer] [id number] Right 1 Implanted  ATTUNE PS FEM RT SZ 4 CEM KNEE - [id number] Femur ATTUNE PS FEM RT SZ 4 CEM KNEE  DEPU[employer] ORTH[employer] [id number] Right 1 Implanted  BASEPLATE TIB CMT FB PCKT SZ5 - [id number] Knees BASEPLATE TIB CMT FB PCKT SZ5  DEPU[employer] ORTH[employer] [id number] Right 1 Implanted  INSERT TIB ATTUNE FB SZ4X6 - [id number] Insert INSERT TIB ATTUNE FB SZ4X6  DEPU[employer] ORTH[employer] [id number] Right 1 Implanted     Indications for Surgery:   Disabling pain of the right knee which did  not improve despite nonoperative measures. The benefits and risks of operative and nonoperative management were discussed prior to surgery with patient/guardian(s) and informed consent form was completed.  While all risks cannot be anticipated, specific risks including infection, need for additional surgery, stiffness, postop pain, infection, implant removal, loosening, infection requiring amputation, deep vein thrombosis, pulmonary embolus were discussed.   Procedure:    Details of surgery: The patient was identified by 2 approved identification mechanisms. The operative extremity was evaluated and found to be acceptable for surgical treatment today. The chart was reviewed. The surgical site was confirmed and marked over the  RIGHT knee   Saphenous nerve block was planned and completed in the PACU yes [Y/N]   The patient was taken to the operating room and given 2 g Ancef  antibiotic.  This is consistent with the SCIP protocol.  The patient was given the following anesthetic: Spinal  The patient was then placed supine on the operating table. A Foley catheter was inserted. The operative extremity was prepped and draped sterilely from the toes to the groin.  Timeout was executed confirming the patient's name, surgical site, antibiotic administration, x-rays available, and implants available.  The operative limb,  was exsanguinated with a six-inch Esmarch and the tourniquet was inflated to 280 mmHg.  A straight midline incision was made over the right KNEE and taken down to the extensor mechanism. A medial arthrotomy was performed. The patella was everted and the patellofemoral soft tissue was released, along with the patellar fat pad.  The anterior cruciate ligament and PCL  were resected. The anterior horns of the lateral and medial meniscus were resected. The medial soft tissue sleeve was elevated to the mid coronal plane.   A three-eighths inch drill bit was used to enter the femoral canal  which was decompressed with suction and irrigation until clear.  The distal femoral cutting guide was set for 9 mm distal resection,  5valgus alignment, for a right knee. The distal femur was resected and checked for flatness.  The distal resection was measured at 8 mm of the block was moved back 2 mm The attune sizing femoral guide was placed and the femur was preliminarily sized to a size 4.    The external alignment guide for the tibial resection was then applied to the distal and proximal tibia and set for 3 degree posterior slope along with 9 MM resection  from the lateral.  Measured resection with caliper was 10 mm Rotational alignment was set using the malleolus, the tibial tubercle and the tibial spines. The proximal tibia was resected along with  residual menisci. The tibia was sized using a base plate to a size  5 .    The extension gap was checked.  The spacer block which balanced extension gap was a size 5  The femur size was rechecked and measured a 4 The femoral cutting block was placed.  The spacer block which balanced extension gap was placed under the 4-in-1 femoral cutting block.  An assessment was made at this point as to whether the block should be moved up or down or left in place.   The block was moved down [Y/N] NO.  The size 6 spacer block balance the flexion gap.  Rotation was assessed using Whitesides line and the epicondyles.  Once I was satisfied with the spacer block collateral ligament retractors were placed and I completed the 4 distal femoral cuts   The extension gap was rechecked with the size 6 spacer block.  The flexion and extension gaps were now balanced and I proceeded to cut the notch in the femur. The correct sized notch cutting guide for the femur was then applied and the notch cut was made.   Trial reduction was completed using size size 5 tibia size 4 femur size 6 poly trial trial implants. Patella tracking was normal  We then skeletonized the  patella. It measured 23 in thickness and the patellar resection was set for 9.5 millimeters. the patellar resection was completed by hand as the first cut only took 8.5 mm. The patella diameter measured 35. We then drilled the peg holes for the patella.    The proximal tibia was prepared using the size 5 base plate.  Thorough irrigation was performed using saline  and the bone was dried and prepared for cement. The cement was mixed on the back table using third generation preparation techniques  The implants were then cemented in place and excess cement was removed. The cement was allowed to cure.  Normal saline irrigation followed by Surgipor irrigation followed by normal saline irrigation  Any excess bone fragments and cement were removed.  Closure was done with the following sutures  #1 Braylon  Interrupted 0 Monocryl  Running 0 Monocryl  Skin approximation was performed using staples  A sterile dressing was applied, TED hose were placed on the operative extremity followed by Cryo/Cuff.  The patient was taken recovery room in stable condition  Postop plan: Weightbearing as tolerated CPM machine Immediate physical therapy Discharge tomorrow if stable

## 2024-06-17 NOTE — Anesthesia Preprocedure Evaluation (Signed)
 Anesthesia Evaluation  Patient identified by MRN, date of birth, ID band Patient awake    Reviewed: Allergy & Precautions, H&P , NPO status , Patient's Chart, lab work & pertinent test results, reviewed documented beta blocker date and time   Airway Mallampati: II  TM Distance: >3 FB Neck ROM: full    Dental no notable dental hx.    Pulmonary asthma    Pulmonary exam normal breath sounds clear to auscultation       Cardiovascular Exercise Tolerance: Good hypertension, negative cardio ROS Normal cardiovascular exam Rhythm:regular Rate:Normal     Neuro/Psych  Headaches  Anxiety     negative neurological ROS  negative psych ROS   GI/Hepatic negative GI ROS, Neg liver ROS,,,  Endo/Other  Hypothyroidism    Renal/GU negative Renal ROS  negative genitourinary   Musculoskeletal  (+) Arthritis , Osteoarthritis,    Abdominal   Peds  Hematology negative hematology ROS (+)   Anesthesia Other Findings   Reproductive/Obstetrics negative OB ROS                              Anesthesia Physical Anesthesia Plan  ASA: 2  Anesthesia Plan: Spinal   Post-op Pain Management: Regional block* and Dilaudid  IV   Induction:   PONV Risk Score and Plan: Propofol  infusion  Airway Management Planned: Nasal Cannula and Natural Airway  Additional Equipment: None  Intra-op Plan:   Post-operative Plan:   Informed Consent: I have reviewed the patients History and Physical, chart, labs and discussed the procedure including the risks, benefits and alternatives for the proposed anesthesia with the patient or authorized representative who has indicated his/her understanding and acceptance.     Dental Advisory Given  Plan Discussed with: CRNA  Anesthesia Plan Comments:         Anesthesia Quick Evaluation

## 2024-06-17 NOTE — Plan of Care (Signed)
  Problem: Acute Rehab PT Goals(only PT should resolve) Goal: Pt Will Go Supine/Side To Sit Outcome: Progressing Flowsheets (Taken 06/17/2024 1620) Pt will go Supine/Side to Sit: with modified independence Goal: Patient Will Transfer Sit To/From Stand Outcome: Progressing Flowsheets (Taken 06/17/2024 1620) Patient will transfer sit to/from stand: with modified independence Goal: Pt Will Transfer Bed To Chair/Chair To Bed Outcome: Progressing Flowsheets (Taken 06/17/2024 1620) Pt will Transfer Bed to Chair/Chair to Bed: with modified independence Goal: Pt Will Ambulate Outcome: Progressing Flowsheets (Taken 06/17/2024 1620) Pt will Ambulate:  > 125 feet  with modified independence  with rolling walker   4:21 PM, 06/17/24 Lynwood Music, MPT Physical Therapist with [facility] 336 [phone] office [phone] mobile phone

## 2024-06-17 NOTE — Interval H&P Note (Signed)
 History and Physical Interval Note:  06/17/2024 10:07 AM  Diana Skinner  has presented today for surgery, with the diagnosis of osteoarthritis Right knee.  The various methods of treatment have been discussed with the patient and family. After consideration of risks, benefits and other options for treatment, the patient has consented to  Procedure(s): ARTHROPLASTY, KNEE, TOTAL (Right) as a surgical intervention.  The patient's history has been reviewed, patient examined, no change in status, stable for surgery.  I have reviewed the patient's chart and labs.  Questions were answered to the patient's satisfaction.     Taft Minerva

## 2024-06-17 NOTE — Anesthesia Procedure Notes (Signed)
 Anesthesia Regional Block: Adductor canal block   Pre-Anesthetic Checklist: , timeout performed,  Correct Patient, Correct Site, Correct Laterality,  Correct Procedure, Correct Position, site marked,  Risks and benefits discussed,  Surgical consent,  Pre-op evaluation,  At surgeon's request and post-op pain management  Laterality: Right  Prep: chloraprep       Needles:  Injection technique: Single-shot  Needle Type: Echogenic Needle     Needle Length: 4cm  Needle Gauge: 20   Needle insertion depth: 3 cm   Additional Needles:   Procedures:,,,, ultrasound used (permanent image in chart),,    Narrative:  Start time: 06/17/2024 1:30 PM End time: 06/17/2024 1:40 PM Injection made incrementally with aspirations every 5 mL.  Performed by: Personally  Anesthesiologist: Landry Dunnings, MD  Additional Notes: Injected 30cc 0.5% ropivicaine around nerve to vastus medialis and saphenous with 60 mcg precedex using ultrasound and nerve stimulator.  Risks and benefits explained to the patient.

## 2024-06-17 NOTE — Plan of Care (Signed)
  Problem: Education: Goal: Knowledge of General Education information will improve Description: Including pain rating scale, medication(s)/side effects and non-pharmacologic comfort measures Outcome: Progressing   Problem: Health Behavior/Discharge Planning: Goal: Ability to manage health-related needs will improve Outcome: Progressing   Problem: Clinical Measurements: Goal: Ability to maintain clinical measurements within normal limits will improve Outcome: Progressing Goal: Will remain free from infection Outcome: Progressing Goal: Diagnostic test results will improve Outcome: Progressing Goal: Respiratory complications will improve Outcome: Progressing   Problem: Activity: Goal: Risk for activity intolerance will decrease Outcome: Progressing   Problem: Elimination: Goal: Will not experience complications related to bowel motility Outcome: Progressing Goal: Will not experience complications related to urinary retention Outcome: Progressing   Problem: Pain Managment: Goal: General experience of comfort will improve and/or be controlled Outcome: Progressing   Problem: Safety: Goal: Ability to remain free from injury will improve Outcome: Progressing

## 2024-06-17 NOTE — Anesthesia Procedure Notes (Signed)
 Spinal  Patient location during procedure: OR Start time: 06/17/2024 10:44 AM Reason for block: surgical anesthesia Staffing Performed: resident/CRNA  Resident/CRNA: Dartha Meckel, CRNA Performed by: Dartha Meckel, CRNA Authorized by: Landry Dunnings, MD   Preanesthetic Checklist Completed: patient identified, IV checked, site marked, risks and benefits discussed, surgical consent, monitors and equipment checked, pre-op evaluation and timeout performed Spinal Block Patient position: sitting Prep: DuraPrep Patient monitoring: heart rate, cardiac monitor, continuous pulse ox and blood pressure Approach: midline Location: L3-4 Injection technique: single-shot Needle Needle type: Pencan  Needle gauge: 27 G Assessment Sensory level: T4 Events: CSF return

## 2024-06-18 ENCOUNTER — Encounter (HOSPITAL_COMMUNITY): Payer: Self-pay | Admitting: Orthopedic Surgery

## 2024-06-18 DIAGNOSIS — M1711 Unilateral primary osteoarthritis, right knee: Secondary | ICD-10-CM | POA: Diagnosis not present

## 2024-06-18 DIAGNOSIS — Z79899 Other long term (current) drug therapy: Secondary | ICD-10-CM | POA: Diagnosis not present

## 2024-06-18 DIAGNOSIS — Z7982 Long term (current) use of aspirin: Secondary | ICD-10-CM | POA: Diagnosis not present

## 2024-06-18 DIAGNOSIS — Z96652 Presence of left artificial knee joint: Secondary | ICD-10-CM | POA: Diagnosis not present

## 2024-06-18 DIAGNOSIS — J45909 Unspecified asthma, uncomplicated: Secondary | ICD-10-CM | POA: Diagnosis not present

## 2024-06-18 DIAGNOSIS — E039 Hypothyroidism, unspecified: Secondary | ICD-10-CM | POA: Diagnosis not present

## 2024-06-18 DIAGNOSIS — I10 Essential (primary) hypertension: Secondary | ICD-10-CM | POA: Diagnosis not present

## 2024-06-18 LAB — CBC
HCT: 32.5 % — ABNORMAL LOW (ref 36.0–46.0)
Hemoglobin: 10.1 g/dL — ABNORMAL LOW (ref 12.0–15.0)
MCH: 29.7 pg (ref 26.0–34.0)
MCHC: 31.1 g/dL (ref 30.0–36.0)
MCV: 95.6 fL (ref 80.0–100.0)
Platelets: 214 K/uL (ref 150–400)
RBC: 3.4 MIL/uL — ABNORMAL LOW (ref 3.87–5.11)
RDW: 13.3 % (ref 11.5–15.5)
WBC: 12.4 K/uL — ABNORMAL HIGH (ref 4.0–10.5)
nRBC: 0 % (ref 0.0–0.2)

## 2024-06-18 LAB — BASIC METABOLIC PANEL WITH GFR
Anion gap: 6 (ref 5–15)
BUN: 19 mg/dL (ref 8–23)
CO2: 25 mmol/L (ref 22–32)
Calcium: 8.7 mg/dL — ABNORMAL LOW (ref 8.9–10.3)
Chloride: 107 mmol/L (ref 98–111)
Creatinine, Ser: 0.77 mg/dL (ref 0.44–1.00)
GFR, Estimated: >60 mL/min (ref >60)
Glucose, Bld: 112 mg/dL — ABNORMAL HIGH (ref 70–99)
Potassium: 4.1 mmol/L (ref 3.5–5.1)
Sodium: 138 mmol/L (ref 135–145)

## 2024-06-18 MED ORDER — HYDROCODONE-ACETAMINOPHEN 5-325 MG PO TABS: 1.0000 | ORAL_TABLET | ORAL | 0 refills | Status: AC | PRN

## 2024-06-18 MED ORDER — ENOXAPARIN SODIUM 40 MG/0.4ML IJ SOSY
40.0000 mg | PREFILLED_SYRINGE | INTRAMUSCULAR | Status: DC
  Administered 2024-06-18: 40 mg via SUBCUTANEOUS
  Filled 2024-06-18: qty 0.4

## 2024-06-18 NOTE — TOC Transition Note (Signed)
 Transition of Care [facility]) - Discharge Note   Patient Details  Name: Diana Skinner MRN: [id number] Date of Birth: 12-09-1950  Transition of Care [facility]) CM/SW Contact:  Noreen KATHEE Cleotilde ISRAEL Phone Number: 06/18/2024, 10:16 AM   Clinical Narrative:     CSW spoke with patient at bedside to issue OBS and to inform her that [facility] will be her [facility] agency . Patient shared that she had spoke with [facility] this morning about therapy being setup. Patient is still independent, has a walker and cane at home, and was able to drive prior to surgery. Patient said that her husband was on the way to pick her up for DC. TOC signing off.    Final next level of care: Home w Home Health Services Barriers to Discharge: No Barriers Identified   Patient Goals and CMS Choice Patient states their goals for this hospitalization and ongoing recovery are:: return back home CMS Medicare.gov Compare Post Acute Care list provided to:: Patient Choice offered to / list presented to : Patient      Discharge Placement              Patient chooses bed at:  (DC home) Patient to be transferred to facility by: POV- Husband Name of family member notified: Lacara Patient and family notified of of transfer: 06/18/24  Discharge Plan and Services Additional resources added to the After Visit Summary for                            [facility] Arranged: PT [facility] Agency: [facility] Date [facility] Agency Contacted: 06/18/24 Time HH Agency Contacted: 1015 Representative spoke with at [facility] Agency: [facility]  Social Drivers of Health (SDOH) Interventions SDOH Screenings   Food Insecurity: No Food Insecurity (06/17/2024)  Housing: Low Risk  (06/17/2024)  Transportation Needs: No Transportation Needs (06/17/2024)  Utilities: Not At Risk (06/17/2024)  Social Connections: Socially Integrated (06/17/2024)  Tobacco Use: Low Risk  (06/17/2024)     Readmission Risk Interventions     No data to display

## 2024-06-18 NOTE — Progress Notes (Signed)
 Physical Therapy Treatment Patient Details Name: Diana Skinner MRN: [id number] DOB: [birth year] Today's Date: 06/18/2024  RIGHT  KNEE ROM:  110 degrees AMBULATION DISTANCE: 150 feet using RW with Modified Independence   History of Present Illness Diana Skinner is 73 y/o female, s/p Right TKA on 06/17/24, with the diagnosis with the diagnosis of osteoarthritis Right knee..    PT Comments  Patent demonstrates good return for going up/down steps in stairwell using bilateral side rails without loss of balance and understanding acknowledged, increased distance for gait training with good return for right heel to toe stepping and tolerated sitting up at bedside with RLE dangling after therapy. Patient had only minor amount of blood right knee dressing that did not increase after walking and functional activities. Patient will benefit from continued skilled physical therapy in hospital and recommended venue below to increase strength, balance, endurance for safe ADLs and gait.     If plan is discharge home, recommend the following: A little help with walking and/or transfers;A little help with bathing/dressing/bathroom;Help with stairs or ramp for entrance;Assistance with cooking/housework   Can travel by private vehicle        Equipment Recommendations  None recommended by PT    Recommendations for Other Services       Precautions / Restrictions Precautions Precautions: Fall Recall of Precautions/Restrictions: Intact Restrictions Weight Bearing Restrictions Per Provider Order: Yes RLE Weight Bearing Per Provider Order: Weight bearing as tolerated     Mobility  Bed Mobility Overal bed mobility: Modified Independent             General bed mobility comments: good return for moving RLE during bed mobility    Transfers Overall transfer level: Modified independent                 General transfer comment: good return for transferring to/from commode in bathroom     Ambulation/Gait Ambulation/Gait assistance: Modified independent (Device/Increase time) Gait Distance (Feet): 150 Feet Assistive device: Rolling walker (2 wheels) Gait Pattern/deviations: Decreased step length - right, Decreased step length - left, Decreased stride length, Antalgic Gait velocity: slightly decreased     General Gait Details: demonstrates increased endurance/distance for gait training with good return for right heel to toe stepping without loss of balance   Stairs Stairs: Yes   Stair Management: Two rails, Step to pattern Number of Stairs: 4 General stair comments: demonstrates good return for going up/down steps in stair well using bilateral side rails without loss of balance and uderstanding acknowledged   Wheelchair Mobility     Tilt Bed    Modified Rankin (Stroke Patients Only)       Balance Overall balance assessment: Needs assistance Sitting-balance support: Feet supported, No upper extremity supported Sitting balance-Leahy Scale: Good Sitting balance - Comments: seated at EOB   Standing balance support: During functional activity, Bilateral upper extremity supported Standing balance-Leahy Scale: Good Standing balance comment: using RW                            Communication Communication Communication: No apparent difficulties  Cognition Arousal: Alert Behavior During Therapy: WFL for tasks assessed/performed   PT - Cognitive impairments: No apparent impairments                         Following commands: Intact      Cueing Cueing Techniques: Verbal cues, Tactile cues  Exercises  General Comments        Pertinent Vitals/Pain Pain Assessment Pain Assessment: No/denies pain    Home Living                          Prior Function            PT Goals (current goals can now be found in the care plan section) Acute Rehab PT Goals Patient Stated Goal: return home with family to assist PT  Goal Formulation: With patient Time For Goal Achievement: 06/19/24 Potential to Achieve Goals: Good Progress towards PT goals: Progressing toward goals    Frequency    BID      PT Plan      Co-evaluation              AM-PAC PT 6 Clicks Mobility   Outcome Measure  Help needed turning from your back to your side while in a flat bed without using bedrails?: None Help needed moving from lying on your back to sitting on the side of a flat bed without using bedrails?: None Help needed moving to and from a bed to a chair (including a wheelchair)?: None Help needed standing up from a chair using your arms (e.g., wheelchair or bedside chair)?: None Help needed to walk in hospital room?: A Little Help needed climbing 3-5 steps with a railing? : A Little 6 Click Score: 22    End of Session   Activity Tolerance: Patient tolerated treatment well Patient left: in bed;with call bell/phone within reach Nurse Communication: Mobility status PT Visit Diagnosis: Unsteadiness on feet (R26.81);Other abnormalities of gait and mobility (R26.89);Muscle weakness (generalized) (M62.81)     Time: 9057-8997 PT Time Calculation (min) (ACUTE ONLY): 20 min  Charges:    $Gait Training: 8-22 mins $Therapeutic Activity: 8-22 mins PT General Charges $$ ACUTE PT VISIT: 1 Visit                     11:11 AM, 06/18/24 Lynwood Music, MPT Physical Therapist with [facility] 336 [phone] office [phone] mobile phone

## 2024-06-18 NOTE — Plan of Care (Signed)
  Problem: Education: Goal: Knowledge of General Education information will improve Description: Including pain rating scale, medication(s)/side effects and non-pharmacologic comfort measures Outcome: Adequate for Discharge   Problem: Health Behavior/Discharge Planning: Goal: Ability to manage health-related needs will improve Outcome: Adequate for Discharge   Problem: Clinical Measurements: Goal: Ability to maintain clinical measurements within normal limits will improve Outcome: Adequate for Discharge Goal: Will remain free from infection Outcome: Adequate for Discharge Goal: Diagnostic test results will improve Outcome: Adequate for Discharge Goal: Respiratory complications will improve Outcome: Adequate for Discharge Goal: Cardiovascular complication will be avoided Outcome: Adequate for Discharge   Problem: Activity: Goal: Risk for activity intolerance will decrease Outcome: Adequate for Discharge   Problem: Nutrition: Goal: Adequate nutrition will be maintained Outcome: Adequate for Discharge   Problem: Coping: Goal: Level of anxiety will decrease Outcome: Adequate for Discharge   Problem: Elimination: Goal: Will not experience complications related to bowel motility Outcome: Adequate for Discharge Goal: Will not experience complications related to urinary retention Outcome: Adequate for Discharge   Problem: Pain Managment: Goal: General experience of comfort will improve and/or be controlled Outcome: Adequate for Discharge   Problem: Safety: Goal: Ability to remain free from injury will improve Outcome: Adequate for Discharge   Problem: Skin Integrity: Goal: Risk for impaired skin integrity will decrease Outcome: Adequate for Discharge    Problem: Education: Goal: Knowledge of the prescribed therapeutic regimen will improve Outcome: Adequate for Discharge Goal: Individualized Educational Video(s) Outcome: Adequate for Discharge   Problem:  Activity: Goal: Ability to avoid complications of mobility impairment will improve Outcome: Adequate for Discharge Goal: Range of joint motion will improve Outcome: Adequate for Discharge   Problem: Clinical Measurements: Goal: Postoperative complications will be avoided or minimized Outcome: Adequate for Discharge   Problem: Pain Management: Goal: Pain level will decrease with appropriate interventions Outcome: Adequate for Discharge   Problem: Skin Integrity: Goal: Will show signs of wound healing Outcome: Adequate for Discharge   Problem: Acute Rehab PT Goals(only PT should resolve) Goal: Pt Will Go Supine/Side To Sit Outcome: Adequate for Discharge Goal: Patient Will Transfer Sit To/From Stand Outcome: Adequate for Discharge Goal: Pt Will Transfer Bed To Chair/Chair To Bed Outcome: Adequate for Discharge Goal: Pt Will Ambulate Outcome: Adequate for Discharge

## 2024-06-18 NOTE — Discharge Summary (Signed)
 Physician Discharge Summary  Patient ID: Diana Skinner MRN: [id number] DOB/AGE: January 25, 1951 73 y.o.  Admit date: 06/17/2024 Discharge date: 06/18/2024  Admission Diagnoses: Right knee osteoarthritis  Discharge Diagnoses: Right knee osteoarthritis  Discharged Condition: Stable  Procedure: Right knee arthroplasty  Hospital Course:  Hospital day 1 uncomplicated total knee arthroplasty right knee -Participated in physical therapy was able to ambulate with physical therapy and her range of motion was approximating 100 degrees.  She did have an episode of bleeding from the distal portion of incision which was handled with pressure dressing and this seemed to handle the problem   Hospital day 2 the patient continued to do well remained afebrile with stable vital signs -Advance in physical therapy tolerating the stairs with good pain control  Wound checked dressing changed no bleeding noted  Implant Name Type Inv. Item Serial No. Manufacturer Lot No. LRB No. Used Action  CEMENT HV SMART SET - [id number] Cement CEMENT HV SMART SET  [employer] [id number] Right 2 Implanted  PATELLA MEDIAL ATTUN KNEE - [id number] Knees PATELLA MEDIAL ATTUN KNEE  [employer] [id number] Right 1 Implanted  ATTUNE PS FEM RT SZ 4 CEM KNEE - [id number] Femur ATTUNE PS FEM RT SZ 4 CEM KNEE  [employer] [id number] Right 1 Implanted  BASEPLATE TIB CMT FB PCKT SZ5 - [id number] Knees BASEPLATE TIB CMT FB PCKT SZ5  [employer]PU[employer] [id number] Right 1 Implanted  INSERT TIB ATTUNE FB SZ4X6 - [id number] Insert INSERT TIB ATTUNE FB SZ4X6  [employer] [id number] Right 1 Implanted    Lab reports:    Latest Ref Rng & Units 06/18/2024    4:30 AM 06/17/2024    3:29 PM 06/10/2024    2:30 PM  CBC  WBC 4.0 - 10.5 K/uL 12.4  9.9  6.9   Hemoglobin 12.0 - 15.0 g/dL 89.8  87.9  86.8   Hematocrit 36.0 - 46.0 % 32.5  38.9  41.1   Platelets 150 - 400 K/uL 214  225  283       Discharge Exam: BP  111/87   Pulse 63   Temp 98.7 F (37.1 C) (Oral)   Resp 16   Ht 5' 7 (1.702 m)   Wt 74.8 kg   SpO2 97%   BMI 25.83 kg/m  Physical Exam The patient was awake alert and oriented  No calf pain tenderness or swelling, negative Homans  Wound had clotted blood around it but no expressible blood from the wound.  We did note 1 staple had come loose and may have been the source of the bleeding from the joint.  This was treated with cleansing of the wound and application of several Steri-Strips and pressure dressing   Disposition: Discharge disposition: 01-Home or Self Care       Discharge Instructions     Call MD / Call 911   Complete by: As directed    If you experience chest pain or shortness of breath, CALL 911 and be transported to the hospital emergency room.  If you develope a fever above 101 F, pus (white drainage) or increased drainage or redness at the wound, or calf pain, call your surgeon's office.   Constipation Prevention   Complete by: As directed    Drink plenty of fluids.  Prune juice may be helpful.  You may use a stool softener, such as Colace (over the counter) 100 mg twice a day.  Use MiraLax  (over the counter) for constipation as needed.   Diet -  low sodium heart healthy   Complete by: As directed    Discharge instructions   Complete by: As directed    1 - Blue foam  30 min  , 3 x a day   2 - CPM (if your insurer approved) 6 hrs per day  --may divide time in the cpm as tolertated \  --start at 0-90  and increase 10 per day.   --Use it for 2 weeks   3- Exercises as taught while you were in the hospital   4 - No shower   5 - Dressing, It  is waterproof and does not need changing unless it soaks thru    6 - Ice. Apply the cooling device to the knee for 30 min, 6 x a day   Increase activity slowly as tolerated   Complete by: As directed    Post-operative opioid taper instructions:   Complete by: As directed    POST-OPERATIVE OPIOID TAPER  INSTRUCTIONS: It is important to wean off of your opioid medication as soon as possible. If you do not need pain medication after your surgery it is ok to stop day one. Opioids include: Codeine, Hydrocodone (Norco, Vicodin), Oxycodone (Percocet, oxycontin ) and hydromorphone  amongst others.  Long term and even short term use of opiods can cause: Increased pain response Dependence Constipation Depression Respiratory depression And more.  Withdrawal symptoms can include Flu like symptoms Nausea, vomiting And more Techniques to manage these symptoms Hydrate well Eat regular healthy meals Stay active Use relaxation techniques(deep breathing, meditating, yoga) Do Not substitute Alcohol  to help with tapering If you have been on opioids for less than two weeks and do not have pain than it is ok to stop all together.  Plan to wean off of opioids This plan should start within one week post op of your joint replacement. Maintain the same interval or time between taking each dose and first decrease the dose.  Cut the total daily intake of opioids by one tablet each day Next start to increase the time between doses. The last dose that should be eliminated is the evening dose.         Allergies as of 06/18/2024       Reactions   Sulfonamide Derivatives Swelling        Medication List     STOP taking these medications    diclofenac  75 MG EC tablet Commonly known as: VOLTAREN        TAKE these medications    aspirin  EC 325 MG tablet Take 1 tablet (325 mg total) by mouth daily.   docusate sodium  100 MG capsule Commonly known as: COLACE Take 1 capsule (100 mg total) by mouth 2 (two) times daily.   Fish Oil 1200 MG Caps Take 1,200 mg by mouth daily.   HYDROcodone -acetaminophen  5-325 MG tablet Commonly known as: NORCO/VICODIN Take 1-2 tablets by mouth every 4 (four) hours as needed for moderate pain (pain score 4-6).   levothyroxine  50 MCG tablet Commonly known as:  SYNTHROID  Take 50 mcg by mouth daily before breakfast.   loratadine  10 MG tablet Commonly known as: CLARITIN  Take 10 mg by mouth daily.   losartan  100 MG tablet Commonly known as: COZAAR  Take 100 mg by mouth daily.   methocarbamol  500 MG tablet Commonly known as: ROBAXIN  Take 1 tablet (500 mg total) by mouth every 6 (six) hours as needed for muscle spasms.   polyethylene glycol 17 g packet Commonly known as: MIRALAX  / GLYCOLAX  Take 17 g by mouth daily  as needed for mild constipation.   rosuvastatin  20 MG tablet Commonly known as: CRESTOR  Take 20 mg by mouth daily.   SYSTANE OP Apply 1 drop to eye 2 (two) times daily as needed (dry eyes).   traMADol  50 MG tablet Commonly known as: ULTRAM  Take 1 tablet (50 mg total) by mouth every 6 (six) hours.   venlafaxine  XR 37.5 MG 24 hr capsule Commonly known as: EFFEXOR -XR Take 37.5 mg by mouth daily with breakfast.        Follow-up Information     [facility] Follow up.   Why: HHPT will call to schedule start of care.        Margrette Taft BRAVO, MD Follow up on 07/03/2024.   Specialties: Orthopedic Surgery, Radiology Contact information: [address] [place] [address] [phone]                 Signed: Taft Margrette 06/18/2024, 11:15 AM

## 2024-06-18 NOTE — Care Management Obs Status (Signed)
 MEDICARE OBSERVATION STATUS NOTIFICATION   Patient Details  Name: Diana Skinner MRN: [id number] Date of Birth: 05/07/1951   Medicare Observation Status Notification Given:  Yes    Noreen KATHEE Pinal, LCSWA 06/18/2024, 10:07 AM

## 2024-06-19 ENCOUNTER — Telehealth: Payer: Self-pay

## 2024-06-19 ENCOUNTER — Other Ambulatory Visit: Payer: Self-pay | Admitting: Orthopedic Surgery

## 2024-06-19 DIAGNOSIS — Z8781 Personal history of (healed) traumatic fracture: Secondary | ICD-10-CM | POA: Diagnosis not present

## 2024-06-19 DIAGNOSIS — Z96652 Presence of left artificial knee joint: Secondary | ICD-10-CM | POA: Diagnosis not present

## 2024-06-19 DIAGNOSIS — M1990 Unspecified osteoarthritis, unspecified site: Secondary | ICD-10-CM | POA: Diagnosis not present

## 2024-06-19 DIAGNOSIS — G43909 Migraine, unspecified, not intractable, without status migrainosus: Secondary | ICD-10-CM | POA: Diagnosis not present

## 2024-06-19 DIAGNOSIS — E039 Hypothyroidism, unspecified: Secondary | ICD-10-CM | POA: Diagnosis not present

## 2024-06-19 DIAGNOSIS — J45909 Unspecified asthma, uncomplicated: Secondary | ICD-10-CM | POA: Diagnosis not present

## 2024-06-19 DIAGNOSIS — Z96651 Presence of right artificial knee joint: Secondary | ICD-10-CM | POA: Diagnosis not present

## 2024-06-19 DIAGNOSIS — I10 Essential (primary) hypertension: Secondary | ICD-10-CM | POA: Diagnosis not present

## 2024-06-19 DIAGNOSIS — Z9089 Acquired absence of other organs: Secondary | ICD-10-CM | POA: Diagnosis not present

## 2024-06-19 DIAGNOSIS — E7800 Pure hypercholesterolemia, unspecified: Secondary | ICD-10-CM | POA: Diagnosis not present

## 2024-06-19 DIAGNOSIS — Z7982 Long term (current) use of aspirin: Secondary | ICD-10-CM | POA: Diagnosis not present

## 2024-06-19 DIAGNOSIS — Z791 Long term (current) use of non-steroidal anti-inflammatories (NSAID): Secondary | ICD-10-CM | POA: Diagnosis not present

## 2024-06-19 DIAGNOSIS — Z471 Aftercare following joint replacement surgery: Secondary | ICD-10-CM | POA: Diagnosis not present

## 2024-06-19 NOTE — Telephone Encounter (Signed)
 I gave verbal orders for the therapy  And she wants to know why the diclofenac  was d/c? It is in the d/c papers so I just told her that it was discontinued by Dr Margrette

## 2024-06-19 NOTE — Telephone Encounter (Signed)
 Katheryn (PT)with [facility] left message asking for a verbal order for patient. Stated 1 week-I, 3 week-1 and 1 week -1. Needs clarification on patient stopping Diclofenac  Sodium tablets because she has Hydrocodone  and Tramadol .  Please call and advise at [phone]

## 2024-06-19 NOTE — Telephone Encounter (Signed)
I called to advise.  

## 2024-06-23 ENCOUNTER — Telehealth: Payer: Self-pay | Admitting: Orthopedic Surgery

## 2024-06-23 NOTE — Telephone Encounter (Signed)
 Dr. Areatha pt GLENWOOD Mar PT w/[facility] [phone] lvm on 06/20/24 at 1:56pm stating she needs to clarify the pt's OP PT orders

## 2024-06-24 DIAGNOSIS — E039 Hypothyroidism, unspecified: Secondary | ICD-10-CM | POA: Diagnosis not present

## 2024-06-24 DIAGNOSIS — Z96652 Presence of left artificial knee joint: Secondary | ICD-10-CM | POA: Diagnosis not present

## 2024-06-24 DIAGNOSIS — M1990 Unspecified osteoarthritis, unspecified site: Secondary | ICD-10-CM | POA: Diagnosis not present

## 2024-06-24 DIAGNOSIS — I10 Essential (primary) hypertension: Secondary | ICD-10-CM | POA: Diagnosis not present

## 2024-06-24 DIAGNOSIS — Z8781 Personal history of (healed) traumatic fracture: Secondary | ICD-10-CM | POA: Diagnosis not present

## 2024-06-24 DIAGNOSIS — Z9089 Acquired absence of other organs: Secondary | ICD-10-CM | POA: Diagnosis not present

## 2024-06-24 DIAGNOSIS — G43909 Migraine, unspecified, not intractable, without status migrainosus: Secondary | ICD-10-CM | POA: Diagnosis not present

## 2024-06-24 DIAGNOSIS — Z791 Long term (current) use of non-steroidal anti-inflammatories (NSAID): Secondary | ICD-10-CM | POA: Diagnosis not present

## 2024-06-24 DIAGNOSIS — J45909 Unspecified asthma, uncomplicated: Secondary | ICD-10-CM | POA: Diagnosis not present

## 2024-06-24 DIAGNOSIS — Z471 Aftercare following joint replacement surgery: Secondary | ICD-10-CM | POA: Diagnosis not present

## 2024-06-24 DIAGNOSIS — Z7982 Long term (current) use of aspirin: Secondary | ICD-10-CM | POA: Diagnosis not present

## 2024-06-24 DIAGNOSIS — E7800 Pure hypercholesterolemia, unspecified: Secondary | ICD-10-CM | POA: Diagnosis not present

## 2024-06-24 DIAGNOSIS — Z96651 Presence of right artificial knee joint: Secondary | ICD-10-CM | POA: Diagnosis not present

## 2024-06-25 DIAGNOSIS — E7800 Pure hypercholesterolemia, unspecified: Secondary | ICD-10-CM | POA: Diagnosis not present

## 2024-06-25 DIAGNOSIS — Z9089 Acquired absence of other organs: Secondary | ICD-10-CM | POA: Diagnosis not present

## 2024-06-25 DIAGNOSIS — E039 Hypothyroidism, unspecified: Secondary | ICD-10-CM | POA: Diagnosis not present

## 2024-06-25 DIAGNOSIS — Z791 Long term (current) use of non-steroidal anti-inflammatories (NSAID): Secondary | ICD-10-CM | POA: Diagnosis not present

## 2024-06-25 DIAGNOSIS — Z8781 Personal history of (healed) traumatic fracture: Secondary | ICD-10-CM | POA: Diagnosis not present

## 2024-06-25 DIAGNOSIS — Z96652 Presence of left artificial knee joint: Secondary | ICD-10-CM | POA: Diagnosis not present

## 2024-06-25 DIAGNOSIS — G43909 Migraine, unspecified, not intractable, without status migrainosus: Secondary | ICD-10-CM | POA: Diagnosis not present

## 2024-06-25 DIAGNOSIS — Z471 Aftercare following joint replacement surgery: Secondary | ICD-10-CM | POA: Diagnosis not present

## 2024-06-25 DIAGNOSIS — Z7982 Long term (current) use of aspirin: Secondary | ICD-10-CM | POA: Diagnosis not present

## 2024-06-25 DIAGNOSIS — Z96651 Presence of right artificial knee joint: Secondary | ICD-10-CM | POA: Diagnosis not present

## 2024-06-25 DIAGNOSIS — J45909 Unspecified asthma, uncomplicated: Secondary | ICD-10-CM | POA: Diagnosis not present

## 2024-06-25 DIAGNOSIS — I10 Essential (primary) hypertension: Secondary | ICD-10-CM | POA: Diagnosis not present

## 2024-06-25 DIAGNOSIS — M1990 Unspecified osteoarthritis, unspecified site: Secondary | ICD-10-CM | POA: Diagnosis not present

## 2024-06-25 LAB — BPAM RBC
Blood Product Expiration Date: 202508062359
Blood Product Expiration Date: 202508112359
Unit Type and Rh: 5100
Unit Type and Rh: 5100

## 2024-06-25 LAB — TYPE AND SCREEN
ABO/RH(D): O POS
Antibody Screen: NEGATIVE
Unit division: 0
Unit division: 0

## 2024-06-27 DIAGNOSIS — E039 Hypothyroidism, unspecified: Secondary | ICD-10-CM | POA: Diagnosis not present

## 2024-06-27 DIAGNOSIS — I10 Essential (primary) hypertension: Secondary | ICD-10-CM | POA: Diagnosis not present

## 2024-06-27 DIAGNOSIS — M1990 Unspecified osteoarthritis, unspecified site: Secondary | ICD-10-CM | POA: Diagnosis not present

## 2024-06-27 DIAGNOSIS — Z8781 Personal history of (healed) traumatic fracture: Secondary | ICD-10-CM | POA: Diagnosis not present

## 2024-06-27 DIAGNOSIS — E7800 Pure hypercholesterolemia, unspecified: Secondary | ICD-10-CM | POA: Diagnosis not present

## 2024-06-27 DIAGNOSIS — J45909 Unspecified asthma, uncomplicated: Secondary | ICD-10-CM | POA: Diagnosis not present

## 2024-06-27 DIAGNOSIS — Z471 Aftercare following joint replacement surgery: Secondary | ICD-10-CM | POA: Diagnosis not present

## 2024-06-27 DIAGNOSIS — G43909 Migraine, unspecified, not intractable, without status migrainosus: Secondary | ICD-10-CM | POA: Diagnosis not present

## 2024-06-27 DIAGNOSIS — Z96652 Presence of left artificial knee joint: Secondary | ICD-10-CM | POA: Diagnosis not present

## 2024-06-27 DIAGNOSIS — Z96651 Presence of right artificial knee joint: Secondary | ICD-10-CM | POA: Diagnosis not present

## 2024-06-27 DIAGNOSIS — Z791 Long term (current) use of non-steroidal anti-inflammatories (NSAID): Secondary | ICD-10-CM | POA: Diagnosis not present

## 2024-06-27 DIAGNOSIS — Z9089 Acquired absence of other organs: Secondary | ICD-10-CM | POA: Diagnosis not present

## 2024-06-27 DIAGNOSIS — Z7982 Long term (current) use of aspirin: Secondary | ICD-10-CM | POA: Diagnosis not present

## 2024-06-30 ENCOUNTER — Encounter: Payer: Self-pay | Admitting: Orthopedic Surgery

## 2024-06-30 ENCOUNTER — Telehealth: Payer: Self-pay | Admitting: Orthopedic Surgery

## 2024-06-30 ENCOUNTER — Ambulatory Visit (INDEPENDENT_AMBULATORY_CARE_PROVIDER_SITE_OTHER): Admitting: Orthopedic Surgery

## 2024-06-30 DIAGNOSIS — Z96652 Presence of left artificial knee joint: Secondary | ICD-10-CM | POA: Diagnosis not present

## 2024-06-30 DIAGNOSIS — Z7982 Long term (current) use of aspirin: Secondary | ICD-10-CM | POA: Diagnosis not present

## 2024-06-30 DIAGNOSIS — Z471 Aftercare following joint replacement surgery: Secondary | ICD-10-CM | POA: Diagnosis not present

## 2024-06-30 DIAGNOSIS — Z791 Long term (current) use of non-steroidal anti-inflammatories (NSAID): Secondary | ICD-10-CM | POA: Diagnosis not present

## 2024-06-30 DIAGNOSIS — Z96651 Presence of right artificial knee joint: Secondary | ICD-10-CM

## 2024-06-30 DIAGNOSIS — M1990 Unspecified osteoarthritis, unspecified site: Secondary | ICD-10-CM | POA: Diagnosis not present

## 2024-06-30 DIAGNOSIS — E039 Hypothyroidism, unspecified: Secondary | ICD-10-CM | POA: Diagnosis not present

## 2024-06-30 DIAGNOSIS — J45909 Unspecified asthma, uncomplicated: Secondary | ICD-10-CM | POA: Diagnosis not present

## 2024-06-30 DIAGNOSIS — E7800 Pure hypercholesterolemia, unspecified: Secondary | ICD-10-CM | POA: Diagnosis not present

## 2024-06-30 DIAGNOSIS — I10 Essential (primary) hypertension: Secondary | ICD-10-CM | POA: Diagnosis not present

## 2024-06-30 DIAGNOSIS — Z9089 Acquired absence of other organs: Secondary | ICD-10-CM | POA: Diagnosis not present

## 2024-06-30 DIAGNOSIS — Z8781 Personal history of (healed) traumatic fracture: Secondary | ICD-10-CM | POA: Diagnosis not present

## 2024-06-30 DIAGNOSIS — G43909 Migraine, unspecified, not intractable, without status migrainosus: Secondary | ICD-10-CM | POA: Diagnosis not present

## 2024-06-30 NOTE — Progress Notes (Signed)
   There were no vitals taken for this visit.  There is no height or weight on file to calculate BMI.  Chief Complaint  Patient presents with   Post-op Follow-up    Right total knee replacement/ large amount of bleeding this weekend     Encounter Diagnosis  Name Primary?   S/P total knee replacement, right 06/17/24 Yes    DOI/DOS/ Date: 06/17/24

## 2024-06-30 NOTE — Progress Notes (Signed)
 Diana Skinner had right knee arthroplasty 13 days ago  She had excessive bleeding over the weekend  She came in today with some serosanguineous drainage  No increase in pain range of motion still at 125  She is having issues with the staples I do not think they fired appropriately and malfunctioned  We will continue the dressing changes for now I will see her on Thursday to take them all out we may need some Steri-Strips and I cannot rule out repeat surgery

## 2024-06-30 NOTE — Telephone Encounter (Signed)
 Dr. Areatha pt - Damien Ada w/[facility] [phone] lvm 06/27/24 1:23pm stating the pt is having some bleeding out from under the bandage some, stated she did this last time, but this time has some mild redness, no fever, wanted to make you aware.

## 2024-07-01 ENCOUNTER — Encounter (HOSPITAL_COMMUNITY): Payer: Self-pay

## 2024-07-01 ENCOUNTER — Ambulatory Visit (HOSPITAL_COMMUNITY): Attending: Orthopedic Surgery

## 2024-07-01 ENCOUNTER — Other Ambulatory Visit: Payer: Self-pay

## 2024-07-01 DIAGNOSIS — Z7409 Other reduced mobility: Secondary | ICD-10-CM | POA: Insufficient documentation

## 2024-07-01 DIAGNOSIS — R29898 Other symptoms and signs involving the musculoskeletal system: Secondary | ICD-10-CM | POA: Diagnosis not present

## 2024-07-01 DIAGNOSIS — M1711 Unilateral primary osteoarthritis, right knee: Secondary | ICD-10-CM | POA: Diagnosis not present

## 2024-07-01 DIAGNOSIS — M25661 Stiffness of right knee, not elsewhere classified: Secondary | ICD-10-CM | POA: Diagnosis not present

## 2024-07-01 NOTE — Therapy (Signed)
 OUTPATIENT PHYSICAL THERAPY LOWER EXTREMITY EVALUATION   Patient Name: Diana Skinner MRN: [id number] DOB:[birth year], 73 y.o., female Today's Date: 07/01/2024  END OF SESSION:  PT End of Session - 07/01/24 1619     Visit Number 1 (P)     Date for PT Re-Evaluation 08/12/24 (P)     Authorization Type [employer] MEDICARE (P)     Authorization Time Period seeking auth (P)     Authorization - Visit Number 0 (P)     Progress Note Due on Visit 10 (P)     PT Start Time 1300 (P)     PT Stop Time 1335 (P)     PT Time Calculation (min) 35 min (P)     Activity Tolerance Patient tolerated treatment well;Patient limited by pain (P)     Behavior During Therapy WFL for tasks assessed/performed (P)           Past Medical History:  Diagnosis Date   Anxiety    Arthritis    Asthma    High cholesterol    HTN (hypertension)    Hypothyroidism    Migraines    Past Surgical History:  Procedure Laterality Date   CATARACT EXTRACTION W/PHACO Right 01/27/2019   Procedure: CATARACT EXTRACTION PHACO AND INTRAOCULAR LENS PLACEMENT (IOC);  Surgeon: Harrie Agent, MD;  Location: [facility];  Service: Ophthalmology;  Laterality: Right;  CDE: 8.33   CATARACT EXTRACTION W/PHACO Left 02/10/2019   Procedure: CATARACT EXTRACTION PHACO AND INTRAOCULAR LENS PLACEMENT (IOC);  Surgeon: Harrie Agent, MD;  Location: [facility];  Service: Ophthalmology;  Laterality: Left;  CDE: 5.49   CESAREAN SECTION     COLONOSCOPY  06/19/2012   Procedure: COLONOSCOPY;  Surgeon: Claudis RAYMOND Rivet, MD;  Location: [facility];  Service: Endoscopy;  Laterality: N/A;  1030   ORIF RADIAL FRACTURE Right 08/27/2016   Procedure: OPEN REDUCTION INTERNAL FIXATION (ORIF) RADIAL FRACTURE/DISTAL;  Surgeon: Balinda Rogue, MD;  Location: [facility] OR;  Service: Plastics;  Laterality: Right;   TONSILLECTOMY AND ADENOIDECTOMY     TOTAL KNEE ARTHROPLASTY Left 03/25/2024   Procedure: ARTHROPLASTY, KNEE, TOTAL;  Surgeon: Margrette Taft BRAVO, MD;  Location: [facility];   Service: Orthopedics;  Laterality: Left;   TOTAL KNEE ARTHROPLASTY Right 06/17/2024   Procedure: ARTHROPLASTY, KNEE, TOTAL;  Surgeon: Margrette Taft BRAVO, MD;  Location: [facility];  Service: Orthopedics;  Laterality: Right;   Patient Active Problem List   Diagnosis Date Noted   Primary osteoarthritis of right knee 06/17/2024   S/P total knee arthroplasty, left 03/25/24 04/08/2024   Primary osteoarthritis of left knee 03/25/2024   Fracture of right wrist 08/27/2016   Bursitis of knee 05/30/2011   KNEE PAIN 01/10/2010   CLOSED FRACTURE OF UPPER END OF TIBIA 01/10/2010   DERANGEMENT MENISCUS 01/03/2010    PCP: Marvine Rush, MD   REFERRING PROVIDER: Margrette Taft BRAVO, MD  REFERRING DIAG: M17.11 (ICD-10-CM) - Primary osteoarthritis of right knee  THERAPY DIAG:  Decreased ROM of right knee  Weakness of right lower extremity  Impaired functional mobility, balance, and endurance  Rationale for Evaluation and Treatment: Rehabilitation  ONSET DATE: 06/17/24 R TKA  SUBJECTIVE:   SUBJECTIVE STATEMENT: Pt states that she does has some sharp pain along the front of the right knee pain. Pt states she had to go before postop due to bleeding of incision. Pt states follow up is Thursday. Pt states she would like to be able to handle the new grandchild. Pt states she had two weeks of home therapy,  pt states she only has used the [facility] due to the insistence of the home therapist. Pt states she has been wearing the compression sleeve but not wearing one upon presentation. Pt has used the CPM but has toned it back since she was bleeding some.   PERTINENT HISTORY: 04/25 left knee replacement  PAIN:  Are you having pain? Yes: NPRS scale: 5/10 Pain location: front of knee and anterior thigh Pain description: discomfort, some sharp pain Aggravating factors: bending, squatting, lifting Relieving factors: pain meds, ice  PRECAUTIONS: Knee  RED FLAGS: None   WEIGHT BEARING RESTRICTIONS:  No  FALLS:  Has patient fallen in last 6 months? No  LIVING ENVIRONMENT: Lives with: lives with their family and lives with their spouse Lives in: House/apartment Stairs: Yes: External: 5 steps; on right going up Has following equipment at home: Single point cane and Walker - 2 wheeled  OCCUPATION: retired  PLOF: Independent with basic ADLs  PATIENT GOALS: keep up with grandchildren, walk the green way again  NEXT MD VISIT: Thursday the 31st of July  OBJECTIVE:  Note: Objective measures were completed at Evaluation unless otherwise noted.  DIAGNOSTIC FINDINGS: CLINICAL DATA:  Status post right knee arthroplasty   EXAM: PORTABLE RIGHT KNEE - 2 VIEW   COMPARISON:  Right knee radiograph dated 03/03/2024   FINDINGS: Postsurgical changes of right knee arthroplasty. Hardware appears intact and well seated. No acute fracture or dislocation. Midline surgical staples along the anterior knee. Overlying soft tissue swelling and subcutaneous emphysema.   IMPRESSION: Postsurgical changes of right knee arthroplasty.  PATIENT SURVEYS:  LEFS:  45/80  COGNITION: Overall cognitive status: Within functional limits for tasks assessed     SENSATION: Some numbness and tingling on outside of right knee  EDEMA:  Normal post op edema  PALPATION: Tenderness to anterior knee, tenderness to anterior and posterior proximal thigh  LOWER EXTREMITY ROM:  Active ROM Right eval Left eval  Hip flexion    Hip extension    Hip abduction    Hip adduction    Hip internal rotation    Hip external rotation    Knee flexion 112   Knee extension 5 degrees from neutral   Ankle dorsiflexion    Ankle plantarflexion    Ankle inversion    Ankle eversion     (Blank rows = not tested)  LOWER EXTREMITY MMT:  MMT Right eval Left eval  Hip flexion    Hip extension    Hip abduction    Hip adduction    Hip internal rotation    Hip external rotation    Knee flexion    Knee extension     Ankle dorsiflexion    Ankle plantarflexion    Ankle inversion    Ankle eversion     (Blank rows = not tested)  FUNCTIONAL TESTS:  5 times sit to stand: 13.24 seconds with bilateral UE support 2 minute walk test: 380 feet no AD  SLS 07/01/24: R: 7.03s  L: 8.12s  GAIT: Distance walked: 380 feet, with no AD Assistive device utilized: None Level of assistance: Complete Independence Comments: decreased stance time on RLE noted especially during end of .  TREATMENT DATE:  07/01/2024  Evaluation: -ROM measured, Strength assessed, HEP prescribed, pt educated on prognosis, findings, and importance of HEP compliance if given.        PATIENT EDUCATION:  Education details: Pt was educated on findings of PT evaluation, prognosis, frequency of therapy visits and rationale, attendance policy, and HEP if given.   Person educated: Patient Education method: Explanation, Verbal cues, and Handouts Education comprehension: verbalized understanding, verbal cues required, and needs further education  HOME EXERCISE PROGRAM: Access Code: [email or web address] URL: [email or web address] Date: 07/01/2024 Prepared by: Lang Ada  Exercises - Supine Bridge  - 1 x daily - 7 x weekly - 3 sets - 10 reps - 3 second hold - Sidelying Hip Abduction  - 1 x daily - 7 x weekly - 3 sets - 10 reps - Supine Heel Slide  - 1 x daily - 7 x weekly - 3 sets - 10 reps  ASSESSMENT:  CLINICAL IMPRESSION: Patient is a 73 y.o. female who was seen today for physical therapy evaluation and treatment for M17.11 (ICD-10-CM) - Primary osteoarthritis of right knee.   Patient demonstrates decreased RLE strength/ROM, abnormal pain of right knee, and impaired balance. Patient also demonstrates difficulty with ambulation during today's session with decreased stride length and velocity noted on RLE  especially with prolonged walking. Patient also demonstrates limited knee flexion although postoperative bandaging still donned. Patient requires education on role of PT, importance of progression of exercises and allowing time for proper healing of knee tissues. Patient would benefit from skilled physical therapy for increased endurance with ambulation, increased RLE strength, and balance for improved gait quality, return to higher level of function with ADLs, and progress towards therapy goals.   OBJECTIVE IMPAIRMENTS: Abnormal gait, decreased activity tolerance, decreased balance, decreased endurance, decreased mobility, decreased ROM, decreased strength, and pain.   ACTIVITY LIMITATIONS: carrying, lifting, bending, sitting, standing, squatting, stairs, and transfers  PARTICIPATION LIMITATIONS: meal prep, cleaning, laundry, driving, shopping, community activity, and yard work  PERSONAL FACTORS: Age, Past/current experiences, and 1 comorbidity: LTKA earlier this year are also affecting patient's functional outcome.   REHAB POTENTIAL: Good  CLINICAL DECISION MAKING: Stable/uncomplicated  EVALUATION COMPLEXITY: Low   GOALS: Goals reviewed with patient? No  SHORT TERM GOALS: Target date: 07/22/24  Patient will demonstrate evidence of independence with individualized HEP and will report compliance for at least 3 days per week for optimized progression towards remaining therapy goals. Baseline:  Goal status: INITIAL  2.  Patient will report a decrease in pain level during community ambulation by at least 2 points for improved quality of life. Baseline: 5/10 Goal status: INITIAL     LONG TERM GOALS: Target date: 08/12/24  Pt will demonstrate a an increase of at least 9 points on the LEFS for improved performance of community ambulation and ADL. Baseline: see objective Goal status: INITIAL  2.  Pt will improve 2 MWT by 50 feet in order to demonstrate improved functional ambulatory  capacity in community setting.  Baseline: see objective Goal status: INITIAL  3.  Pt will demonstrate WFL ROM (flexion and extension) in right knee, for increased mobility and maximal efficiency of gait cycle during ambulation. Baseline: see objective Goal status: INITIAL  4.  Pt will demonstrate at least 4-/5 MMT for right lower extremity for increased strength during ADL and community ambulation. Baseline: see objective Goal status: INITIAL  5.  Pt will improve 5TSTS by at least 2.3 seconds in order to improve strength during functional transfers. Baseline: see objective  Goal status: INITIAL    PLAN:  PT FREQUENCY: 1-2x/week  PT DURATION: 6 weeks  PLANNED INTERVENTIONS: 97110-Therapeutic exercises, 97530- Therapeutic activity, [id number]- Neuromuscular re-education, 97535- Self Care, 02859- Manual therapy, [phone]- Gait training, Patient/Family education, Balance training, Stair training, Joint mobilization, DME instructions, Cryotherapy, and Moist heat  PLAN FOR NEXT SESSION: progress RLE strengthening and balance.   Lang Ada, PT, DPT [facility] Office: [phone] 4:29 PM, 07/01/24  [facility] Medicare Auth Request Information   Date of referral: 05/15/24 Referring provider: Margrette Taft BRAVO, MD Referring diagnosis (ICD 10)? M17.11 (ICD-10-CM) - Primary osteoarthritis of right knee Treatment diagnosis (ICD 10)? (if different than referring diagnosis) M25.661 ; R29.898 ; Z74.09    Functional Tool Score: LEFS 49/80   What was this (referring dx) caused by? Surgery (Type: L TKA)   Nature of Condition: Initial Onset (within last 3 months)             Laterality: Lt   Current Functional Measure Score: LEFS 45/80   Objective measurements identify impairments when they are compared to normal values, the uninvolved extremity, and prior level of function.            [x]  Yes            []  No   Objective assessment of functional ability: Moderate  functional limitations             Briefly describe symptoms: decreased ROM and strength, pain   How did symptoms start: age related changes   Average pain intensity:            Last 24 hours: 5/10            Past week: 5/10   How often does the pt experience symptoms? Frequently   How much have the symptoms interfered with usual daily activities? A little bit   How has condition changed since care began at this facility? NA - initial visit   In general, how is the patients overall health? Good     BACK PAIN (STarT Back Screening Tool) No

## 2024-07-03 ENCOUNTER — Encounter: Payer: Self-pay | Admitting: Orthopedic Surgery

## 2024-07-03 ENCOUNTER — Ambulatory Visit (INDEPENDENT_AMBULATORY_CARE_PROVIDER_SITE_OTHER): Admitting: Orthopedic Surgery

## 2024-07-03 DIAGNOSIS — Z96651 Presence of right artificial knee joint: Secondary | ICD-10-CM

## 2024-07-03 MED ORDER — DOXYCYCLINE HYCLATE 100 MG PO TABS: 100.0000 mg | ORAL_TABLET | Freq: Two times a day (BID) | ORAL | 1 refills | Status: AC

## 2024-07-03 NOTE — Progress Notes (Signed)
   POST OP VISIT # 2  Encounter Diagnosis  Name Primary?   S/P total knee replacement, right 06/17/24 Yes    Chief Complaint  Patient presents with   Post-op Follow-up    Right knee 06/17/24     PROCEDURE: right  TKA  This is postop day number 16  Postop pain control  so far good but recently night pain with ache  Subjective complaints  as above   Physical exam findings  see picture in media  Serosanguineous wound drainage distal portion of the right total knee incision.  All staples were removed.  Wound remains intact with serosanguineous drainage.  Erythema around the distal portion of the wound noted.  Assessment and plan  Serosanguineous drainage right knee Postop staples became dislodged from the skin due to malfunctioning.  Start oral antibiotics return on Monday if no improvement patient aware this will require incision and drainage irrigation debridement  Meds ordered this encounter  Medications   doxycycline  (VIBRA -TABS) 100 MG tablet    Sig: Take 1 tablet (100 mg total) by mouth 2 (two) times daily.    Dispense:  28 tablet    Refill:  1     Per [facility] clinic policy, our goal is ensure optimal postoperative pain control with a multimodal pain management strategy. For all [facility] patients, our goal is to wean post-operative narcotic medications by 6 weeks post-operatively. If this is not possible due to utilization of pain medication prior to surgery, your [facility] doctor will support your acute post-operative pain control for the first 6 weeks postoperatively, with a plan to transition you back to your primary pain team following that. Maralee will work to ensure a Therapist, occupational.

## 2024-07-04 ENCOUNTER — Encounter (HOSPITAL_COMMUNITY)

## 2024-07-07 ENCOUNTER — Encounter: Payer: Self-pay | Admitting: Orthopedic Surgery

## 2024-07-07 ENCOUNTER — Encounter (HOSPITAL_COMMUNITY): Payer: Self-pay

## 2024-07-07 ENCOUNTER — Encounter (HOSPITAL_COMMUNITY)
Admission: RE | Admit: 2024-07-07 | Discharge: 2024-07-07 | Disposition: A | Discharge status: Home / Self Care | Source: Ambulatory Visit | Attending: Orthopedic Surgery | Admitting: Orthopedic Surgery

## 2024-07-07 ENCOUNTER — Ambulatory Visit (INDEPENDENT_AMBULATORY_CARE_PROVIDER_SITE_OTHER): Admitting: Orthopedic Surgery

## 2024-07-07 DIAGNOSIS — Z01812 Encounter for preprocedural laboratory examination: Secondary | ICD-10-CM | POA: Insufficient documentation

## 2024-07-07 DIAGNOSIS — Z96651 Presence of right artificial knee joint: Secondary | ICD-10-CM | POA: Diagnosis not present

## 2024-07-07 DIAGNOSIS — L03115 Cellulitis of right lower limb: Secondary | ICD-10-CM

## 2024-07-07 LAB — CBC WITH DIFFERENTIAL/PLATELET
Abs Immature Granulocytes: 0.02 K/uL (ref 0.00–0.07)
Basophils Absolute: 0 K/uL (ref 0.0–0.1)
Basophils Relative: 1 %
Eosinophils Absolute: 0.3 K/uL (ref 0.0–0.5)
Eosinophils Relative: 5 %
HCT: 33.5 % — ABNORMAL LOW (ref 36.0–46.0)
Hemoglobin: 10.5 g/dL — ABNORMAL LOW (ref 12.0–15.0)
Immature Granulocytes: 0 %
Lymphocytes Relative: 23 %
Lymphs Abs: 1.3 K/uL (ref 0.7–4.0)
MCH: 30.3 pg (ref 26.0–34.0)
MCHC: 31.3 g/dL (ref 30.0–36.0)
MCV: 96.5 fL (ref 80.0–100.0)
Monocytes Absolute: 0.5 K/uL (ref 0.1–1.0)
Monocytes Relative: 8 %
Neutro Abs: 3.7 K/uL (ref 1.7–7.7)
Neutrophils Relative %: 63 %
Platelets: 499 K/uL — ABNORMAL HIGH (ref 150–400)
RBC: 3.47 MIL/uL — ABNORMAL LOW (ref 3.87–5.11)
RDW: 15.2 % (ref 11.5–15.5)
WBC: 5.8 K/uL (ref 4.0–10.5)
nRBC: 0 % (ref 0.0–0.2)

## 2024-07-07 LAB — BASIC METABOLIC PANEL WITH GFR
Anion gap: 12 (ref 5–15)
BUN: 16 mg/dL (ref 8–23)
CO2: 24 mmol/L (ref 22–32)
Calcium: 9 mg/dL (ref 8.9–10.3)
Chloride: 103 mmol/L (ref 98–111)
Creatinine, Ser: 0.85 mg/dL (ref 0.44–1.00)
GFR, Estimated: >60 mL/min (ref >60)
Glucose, Bld: 98 mg/dL (ref 70–99)
Potassium: 4 mmol/L (ref 3.5–5.1)
Sodium: 139 mmol/L (ref 135–145)

## 2024-07-07 NOTE — Addendum Note (Signed)
 Addended byBETHA JENEAN GREIG LELON on: 07/07/2024 01:29 PM   Modules accepted: Orders

## 2024-07-07 NOTE — H&P (Signed)
 Diana Skinner is an 73 y.o. female.   Chief Complaint: Drainage from right knee HPI: Diana Skinner is 73 years old she had a right total knee arthroplasty on June 17, 2024 and did well in terms of her physical therapy but developed postop wound drainage on Saturday 726 and presented to the office on 728 with excessive bleeding  At that point we noted that several of the staples had come out of the skin  This was treated with sterile dressing changes.  On July 31 she was having serosanguineous drainage and was started on doxycycline  100 mg twice a day  On August 4 she presented back to the office for wound check  July 07, 2024 we decided to take her back to the operating room for formal irrigation debridement and wound closure.  Past Medical History:  Diagnosis Date   Anxiety    Arthritis    Asthma    High cholesterol    HTN (hypertension)    Hypothyroidism    Migraines     Past Surgical History:  Procedure Laterality Date   CATARACT EXTRACTION W/PHACO Right 01/27/2019   Procedure: CATARACT EXTRACTION PHACO AND INTRAOCULAR LENS PLACEMENT (IOC);  Surgeon: Harrie Agent, MD;  Location: [facility];  Service: Ophthalmology;  Laterality: Right;  CDE: 8.33   CATARACT EXTRACTION W/PHACO Left 02/10/2019   Procedure: CATARACT EXTRACTION PHACO AND INTRAOCULAR LENS PLACEMENT (IOC);  Surgeon: Harrie Agent, MD;  Location: [facility];  Service: Ophthalmology;  Laterality: Left;  CDE: 5.49   CESAREAN SECTION     COLONOSCOPY  06/19/2012   Procedure: COLONOSCOPY;  Surgeon: Claudis RAYMOND Rivet, MD;  Location: [facility];  Service: Endoscopy;  Laterality: N/A;  1030   ORIF RADIAL FRACTURE Right 08/27/2016   Procedure: OPEN REDUCTION INTERNAL FIXATION (ORIF) RADIAL FRACTURE/DISTAL;  Surgeon: Balinda Rogue, MD;  Location: [facility];  Service: Plastics;  Laterality: Right;   TONSILLECTOMY AND ADENOIDECTOMY     TOTAL KNEE ARTHROPLASTY Left 03/25/2024   Procedure: ARTHROPLASTY, KNEE, TOTAL;  Surgeon: Margrette Taft BRAVO,  MD;  Location: [facility];  Service: Orthopedics;  Laterality: Left;   TOTAL KNEE ARTHROPLASTY Right 06/17/2024   Procedure: ARTHROPLASTY, KNEE, TOTAL;  Surgeon: Margrette Taft BRAVO, MD;  Location: [facility];  Service: Orthopedics;  Laterality: Right;    Family History  Problem Relation Age of Onset   Dementia Mother    Heart attack Brother    Heart attack Brother        x2   Heart disease Other    Breast cancer Neg Hx    Social History:  reports that she has never smoked. She has never used smokeless tobacco. She reports current alcohol  use. She reports that she does not use drugs.  Allergies:  Allergies  Allergen Reactions   Sulfonamide Derivatives Swelling    No medications prior to admission.    No results found for this or any previous visit (from the past 48 hours). No results found.  Review of Systems she denies fever she did take her temperature she never had a temperature above 98.  She did have a shaking chill episode last Friday.  Review of systems also reveals she had some nausea after her office visit last Friday.  There were no vitals taken for this visit. Physical Exam Vitals and nursing note reviewed.  Constitutional:      General: She is not in acute distress.    Appearance: Normal appearance. She is normal weight. She is not ill-appearing, toxic-appearing or diaphoretic.  HENT:     Head: Normocephalic and atraumatic.     Nose: No congestion or rhinorrhea.     Mouth/Throat:     Mouth: Mucous membranes are moist.     Pharynx: No oropharyngeal exudate or posterior oropharyngeal erythema.  Eyes:     General: No scleral icterus.       Right eye: No discharge.        Left eye: No discharge.     Extraocular Movements: Extraocular movements intact.     Conjunctiva/sclera: Conjunctivae normal.     Pupils: Pupils are equal, round, and reactive to light.  Cardiovascular:     Rate and Rhythm: Normal rate and regular rhythm.     Pulses: Normal pulses.  Pulmonary:      Effort: Pulmonary effort is normal.  Abdominal:     General: Abdomen is flat. There is no distension.     Palpations: Abdomen is soft.  Musculoskeletal:     Cervical back: Normal range of motion.     Comments: Right knee the distal half of the incision has erythema on each side there is serosanguineous drainage from the distal portion she has a knee joint effusion.  There is no tenderness around the knee joint itself or the prosthetic area.  She has near full extension and 120 degrees of knee flexion.    Skin:    Capillary Refill: Capillary refill takes more than 3 seconds.  Neurological:     General: No focal deficit present.     Mental Status: She is alert and oriented to person, place, and time. Mental status is at baseline.     Cranial Nerves: No cranial nerve deficit.     Motor: No weakness.     Coordination: Coordination normal.     Gait: Gait normal.     Deep Tendon Reflexes: Reflexes normal.  Psychiatric:        Mood and Affect: Mood normal.        Behavior: Behavior normal.        Thought Content: Thought content normal.        Judgment: Judgment normal.      Assessment/Plan Right knee  The procedure has been fully reviewed with the patient; The risks and benefits of surgery have been discussed and explained and understood. Alternative treatment has also been reviewed, questions were encouraged and answered. The postoperative plan is also been reviewed.   Serosanguineous drainage after right total knee arthroplasty which did not improve with wound care dressing changes and antibiotics orally  Patient presents for First am going to perform a right knee aspiration and evaluate the fluid.  If the fluid looks normal then we will do an incision irrigation and debridement down to the fascia/extensor mechanism  If the fluid looks abnormal/infectious then we will do an arthrotomy irrigation debridement incision and drainage and polyethylene exchange right knee    Taft Minerva, MD 07/07/2024, 12:49 PM

## 2024-07-07 NOTE — Progress Notes (Signed)
  Follow-up postop status post right total knee with postop serosanguineous wound drainage  Currently on doxycycline   Wound drainage continues erythema continues  Patient had an episode of shaking chills but says she was afebrile when she checked   Chief Complaint  Patient presents with   Post-op Problem    Right knee     Encounter Diagnosis  Name Primary?   S/P total knee replacement, right 06/17/24 Yes    DOI/DOS/ Date: 06/17/24   Pictures are in the media  Effusion noted right knee distal portion of the incision has erythema and serosanguineous drainage  Recommend irrigation debridement  Knee aspiration culture.  If fluid looks infected then incision drainage polyethylene exchange  Sed rate, C-reactive protein CBC preop

## 2024-07-07 NOTE — Progress Notes (Signed)
   There were no vitals taken for this visit.  There is no height or weight on file to calculate BMI.  Chief Complaint  Patient presents with   Post-op Problem    Right knee     Encounter Diagnosis  Name Primary?   S/P total knee replacement, right 06/17/24 Yes    DOI/DOS/ Date: 06/17/24

## 2024-07-07 NOTE — Patient Instructions (Signed)
 Diana Skinner  07/07/2024     @PREFPERIOPPHARMACY @   Your procedure is scheduled on Tuesday, 8/5.  Report to [facility] at 0940 A.M.  Call this number if you have problems the morning of surgery:  [phone]  If you experience any cold or flu symptoms such as cough, fever, chills, shortness of breath, etc. between now and your scheduled surgery, please notify us  at the above number.   Remember:  Do not eat or drink after midnight.  You may drink clear liquids until 0730 .  Clear liquids allowed are:                    Water , Carbonated beverages (diabetics please choose diet or no sugar options), Clear Tea (No creamer, milk, or cream, including half & half and powdered creamer), and Black Coffee Only (No creamer, milk or cream, including half & half and powdered creamer)    Take these medicines the morning of surgery with A SIP OF WATER  levothyroxine , claritin , effexor , robaxin  and either norco or tramadol     Do not wear jewelry, make-up or nail polish, including gel polish,  artificial nails, or any other type of covering on natural nails (fingers and  toes).  Do not wear lotions, powders, or perfumes, or deodorant.  Do not shave 48 hours prior to surgery.  Men may shave face and neck.  Do not bring valuables to the hospital.  [facility] is not responsible for any belongings or valuables.  Contacts, dentures or bridgework may not be worn into surgery.  Leave your suitcase in the car.  After surgery it may be brought to your room.  For patients admitted to the hospital, discharge time will be determined by your treatment team.  Patients discharged the day of surgery will not be allowed to drive home.   Name and phone number of your driver:   family Special instructions:  none  Please read over the following fact sheets that you were given. Anesthesia Post-op Instructions and Care and Recovery After Surgery      General Anesthesia, Adult, Care After The following  information offers guidance on how to care for yourself after your procedure. Your health care provider may also give you more specific instructions. If you have problems or questions, contact your health care provider. What can I expect after the procedure? After the procedure, it is common for people to: Have pain or discomfort at the IV site. Have nausea or vomiting. Have a sore throat or hoarseness. Have trouble concentrating. Feel cold or chills. Feel weak, sleepy, or tired (fatigue). Have soreness and body aches. These can affect parts of the body that were not involved in surgery. Follow these instructions at home: For the time period you were told by your health care provider:  Rest. Do not participate in activities where you could fall or become injured. Do not drive or use machinery. Do not drink alcohol . Do not take sleeping pills or medicines that cause drowsiness. Do not make important decisions or sign legal documents. Do not take care of children on your own. General instructions Drink enough fluid to keep your urine pale yellow. If you have sleep apnea, surgery and certain medicines can increase your risk for breathing problems. Follow instructions from your health care provider about wearing your sleep device: Anytime you are sleeping, including during daytime naps. While taking prescription pain medicines, sleeping medicines, or medicines that make you drowsy. Return to your normal activities as  told by your health care provider. Ask your health care provider what activities are safe for you. Take over-the-counter and prescription medicines only as told by your health care provider. Do not use any products that contain nicotine or tobacco. These products include cigarettes, chewing tobacco, and vaping devices, such as e-cigarettes. These can delay incision healing after surgery. If you need help quitting, ask your health care provider. Contact a health care provider  if: You have nausea or vomiting that does not get better with medicine. You vomit every time you eat or drink. You have pain that does not get better with medicine. You cannot urinate or have bloody urine. You develop a skin rash. You have a fever. Get help right away if: You have trouble breathing. You have chest pain. You vomit blood. These symptoms may be an emergency. Get help right away. Call 911. Do not wait to see if the symptoms will go away. Do not drive yourself to the hospital. Summary After the procedure, it is common to have a sore throat, hoarseness, nausea, vomiting, or to feel weak, sleepy, or fatigue. For the time period you were told by your health care provider, do not drive or use machinery. Get help right away if you have difficulty breathing, have chest pain, or vomit blood. These symptoms may be an emergency. This information is not intended to replace advice given to you by your health care provider. Make sure you discuss any questions you have with your health care provider. Document Revised: 02/17/2022 Document Reviewed: 02/17/2022 Elsevier Patient Education  2024 [employer].

## 2024-07-08 ENCOUNTER — Encounter (HOSPITAL_COMMUNITY)

## 2024-07-08 ENCOUNTER — Other Ambulatory Visit: Payer: Self-pay

## 2024-07-08 ENCOUNTER — Encounter (HOSPITAL_COMMUNITY): Admitting: Certified Registered Nurse Anesthetist

## 2024-07-08 ENCOUNTER — Encounter (HOSPITAL_COMMUNITY)
Admission: RE | Disposition: A | Discharge status: Home / Self Care | Payer: Self-pay | Source: Home / Self Care | Attending: Orthopedic Surgery

## 2024-07-08 ENCOUNTER — Encounter (HOSPITAL_COMMUNITY): Payer: Self-pay | Admitting: Orthopedic Surgery

## 2024-07-08 ENCOUNTER — Ambulatory Visit (HOSPITAL_BASED_OUTPATIENT_CLINIC_OR_DEPARTMENT_OTHER): Admitting: Certified Registered Nurse Anesthetist

## 2024-07-08 ENCOUNTER — Ambulatory Visit (HOSPITAL_COMMUNITY)
Admission: RE | Admit: 2024-07-08 | Discharge: 2024-07-08 | Disposition: A | Discharge status: Home / Self Care | Attending: Orthopedic Surgery | Admitting: Orthopedic Surgery

## 2024-07-08 DIAGNOSIS — Z79890 Hormone replacement therapy (postmenopausal): Secondary | ICD-10-CM | POA: Insufficient documentation

## 2024-07-08 DIAGNOSIS — E039 Hypothyroidism, unspecified: Secondary | ICD-10-CM | POA: Diagnosis not present

## 2024-07-08 DIAGNOSIS — J45909 Unspecified asthma, uncomplicated: Secondary | ICD-10-CM | POA: Diagnosis not present

## 2024-07-08 DIAGNOSIS — B957 Other staphylococcus as the cause of diseases classified elsewhere: Secondary | ICD-10-CM | POA: Diagnosis not present

## 2024-07-08 DIAGNOSIS — Z96653 Presence of artificial knee joint, bilateral: Secondary | ICD-10-CM | POA: Diagnosis not present

## 2024-07-08 DIAGNOSIS — Z96651 Presence of right artificial knee joint: Secondary | ICD-10-CM | POA: Diagnosis not present

## 2024-07-08 DIAGNOSIS — T8459XA Infection and inflammatory reaction due to other internal joint prosthesis, initial encounter: Secondary | ICD-10-CM | POA: Diagnosis not present

## 2024-07-08 DIAGNOSIS — Z96652 Presence of left artificial knee joint: Secondary | ICD-10-CM

## 2024-07-08 DIAGNOSIS — I10 Essential (primary) hypertension: Secondary | ICD-10-CM | POA: Insufficient documentation

## 2024-07-08 DIAGNOSIS — T8453XA Infection and inflammatory reaction due to internal right knee prosthesis, initial encounter: Secondary | ICD-10-CM | POA: Diagnosis not present

## 2024-07-08 DIAGNOSIS — T8141XA Infection following a procedure, superficial incisional surgical site, initial encounter: Secondary | ICD-10-CM | POA: Diagnosis not present

## 2024-07-08 DIAGNOSIS — Z96659 Presence of unspecified artificial knee joint: Secondary | ICD-10-CM

## 2024-07-08 DIAGNOSIS — X58XXXA Exposure to other specified factors, initial encounter: Secondary | ICD-10-CM | POA: Diagnosis not present

## 2024-07-08 HISTORY — PX: INCISION AND DRAINAGE OF WOUND: SHX1803

## 2024-07-08 HISTORY — PX: ASPIRATION OF ABSCESS: SHX6754

## 2024-07-08 LAB — SEDIMENTATION RATE: Sed Rate: 110 mm/h — ABNORMAL HIGH (ref 0–30)

## 2024-07-08 LAB — C-REACTIVE PROTEIN: CRP: 3 mg/dL — ABNORMAL HIGH (ref <1.0)

## 2024-07-08 SURGERY — IRRIGATION AND DEBRIDEMENT WOUND
Anesthesia: General | Site: Knee | Laterality: Right

## 2024-07-08 MED ORDER — PHENYLEPHRINE 80 MCG/ML (10ML) SYRINGE FOR IV PUSH (FOR BLOOD PRESSURE SUPPORT)
PREFILLED_SYRINGE | INTRAVENOUS | Status: DC | PRN
  Administered 2024-07-08: 80 ug via INTRAVENOUS
  Administered 2024-07-08: 120 ug via INTRAVENOUS
  Administered 2024-07-08: 40 ug via INTRAVENOUS

## 2024-07-08 MED ORDER — MIDAZOLAM HCL 2 MG/2ML IJ SOLN
INTRAMUSCULAR | Status: AC
Start: 2024-07-08 — End: 2024-07-08
  Filled 2024-07-08: qty 2

## 2024-07-08 MED ORDER — FENTANYL CITRATE PF 50 MCG/ML IJ SOSY
25.0000 ug | PREFILLED_SYRINGE | INTRAMUSCULAR | Status: DC | PRN
  Administered 2024-07-08: 50 ug via INTRAVENOUS
  Filled 2024-07-08: qty 1

## 2024-07-08 MED ORDER — HYDROCODONE-ACETAMINOPHEN 5-325 MG PO TABS
1.0000 | ORAL_TABLET | ORAL | Status: AC | PRN
  Administered 2024-07-08: 1 via ORAL
  Filled 2024-07-08: qty 1

## 2024-07-08 MED ORDER — SODIUM CHLORIDE 0.9 % IR SOLN
Status: DC | PRN
  Administered 2024-07-08: 1000 mL
  Administered 2024-07-08: 3000 mL

## 2024-07-08 MED ORDER — LIDOCAINE 2% (20 MG/ML) 5 ML SYRINGE
INTRAMUSCULAR | Status: AC
  Filled 2024-07-08: qty 5

## 2024-07-08 MED ORDER — PHENYLEPHRINE 80 MCG/ML (10ML) SYRINGE FOR IV PUSH (FOR BLOOD PRESSURE SUPPORT)
PREFILLED_SYRINGE | INTRAVENOUS | Status: AC
  Filled 2024-07-08: qty 10

## 2024-07-08 MED ORDER — HYDROCODONE-ACETAMINOPHEN 7.5-325 MG PO TABS: 1.0000 | ORAL_TABLET | Freq: Once | ORAL | Status: DC | PRN

## 2024-07-08 MED ORDER — ROCURONIUM BROMIDE 10 MG/ML (PF) SYRINGE
PREFILLED_SYRINGE | INTRAVENOUS | Status: DC | PRN
Start: 2024-07-08 — End: 2024-07-08
  Administered 2024-07-08: 60 mg via INTRAVENOUS

## 2024-07-08 MED ORDER — FENTANYL CITRATE (PF) 100 MCG/2ML IJ SOLN
INTRAMUSCULAR | Status: DC | PRN
  Administered 2024-07-08 (×2): 50 ug via INTRAVENOUS
  Administered 2024-07-08: 25 ug via INTRAVENOUS

## 2024-07-08 MED ORDER — PROPOFOL 10 MG/ML IV BOLUS
INTRAVENOUS | Status: DC | PRN
Start: 2024-07-08 — End: 2024-07-08
  Administered 2024-07-08: 150 mg via INTRAVENOUS
  Administered 2024-07-08: 50 mg via INTRAVENOUS

## 2024-07-08 MED ORDER — ORAL CARE MOUTH RINSE: 15.0000 mL | Freq: Once | OROMUCOSAL | Status: AC

## 2024-07-08 MED ORDER — LACTATED RINGERS IV SOLN: INTRAVENOUS | Status: DC

## 2024-07-08 MED ORDER — LIDOCAINE 2% (20 MG/ML) 5 ML SYRINGE
INTRAMUSCULAR | Status: DC | PRN
Start: 2024-07-08 — End: 2024-07-08
  Administered 2024-07-08: 100 mg via INTRAVENOUS

## 2024-07-08 MED ORDER — DEXAMETHASONE SODIUM PHOSPHATE 10 MG/ML IJ SOLN
INTRAMUSCULAR | Status: DC | PRN
  Administered 2024-07-08: 5 mg via INTRAVENOUS

## 2024-07-08 MED ORDER — DEXAMETHASONE SODIUM PHOSPHATE 10 MG/ML IJ SOLN
8.0000 mg | Freq: Once | INTRAMUSCULAR | Status: DC | PRN
  Filled 2024-07-08: qty 0.8

## 2024-07-08 MED ORDER — ONDANSETRON HCL 4 MG/2ML IJ SOLN
INTRAMUSCULAR | Status: DC | PRN
  Administered 2024-07-08: 4 mg via INTRAVENOUS

## 2024-07-08 MED ORDER — CHLORHEXIDINE GLUCONATE 0.12 % MT SOLN
15.0000 mL | Freq: Once | OROMUCOSAL | Status: AC
  Administered 2024-07-08: 15 mL via OROMUCOSAL

## 2024-07-08 MED ORDER — PROPOFOL 10 MG/ML IV BOLUS
INTRAVENOUS | Status: AC
  Filled 2024-07-08: qty 20

## 2024-07-08 MED ORDER — ONDANSETRON HCL 4 MG/2ML IJ SOLN
INTRAMUSCULAR | Status: AC
Start: 2024-07-08 — End: 2024-07-08
  Filled 2024-07-08: qty 2

## 2024-07-08 MED ORDER — FENTANYL CITRATE (PF) 100 MCG/2ML IJ SOLN
INTRAMUSCULAR | Status: AC
Start: 2024-07-08 — End: 2024-07-08
  Filled 2024-07-08: qty 2

## 2024-07-08 MED ORDER — SUGAMMADEX SODIUM 200 MG/2ML IV SOLN
INTRAVENOUS | Status: AC
  Filled 2024-07-08: qty 2

## 2024-07-08 MED ORDER — SUGAMMADEX SODIUM 200 MG/2ML IV SOLN
INTRAVENOUS | Status: DC | PRN
Start: 2024-07-08 — End: 2024-07-08
  Administered 2024-07-08: 200 mg via INTRAVENOUS

## 2024-07-08 MED ORDER — DEXAMETHASONE SODIUM PHOSPHATE 10 MG/ML IJ SOLN
INTRAMUSCULAR | Status: AC
Start: 2024-07-08 — End: 2024-07-08
  Filled 2024-07-08: qty 1

## 2024-07-08 MED ORDER — FENTANYL CITRATE (PF) 100 MCG/2ML IJ SOLN
INTRAMUSCULAR | Status: AC
  Filled 2024-07-08: qty 2

## 2024-07-08 MED ORDER — DEXMEDETOMIDINE HCL IN NACL 80 MCG/20ML IV SOLN
INTRAVENOUS | Status: DC | PRN
  Administered 2024-07-08 (×2): 8 ug via INTRAVENOUS

## 2024-07-08 MED ORDER — BUPIVACAINE-EPINEPHRINE (PF) 0.5% -1:200000 IJ SOLN
INTRAMUSCULAR | Status: AC
  Filled 2024-07-08: qty 30

## 2024-07-08 MED ORDER — CEFAZOLIN SODIUM-DEXTROSE 2-3 GM-%(50ML) IV SOLR
INTRAVENOUS | Status: DC | PRN
  Administered 2024-07-08: 2 g via INTRAVENOUS

## 2024-07-08 SURGICAL SUPPLY — 34 items
BNDG COMPR ESMARK 6X3 LF (GAUZE/BANDAGES/DRESSINGS) IMPLANT
BNDG ELASTIC 6INX 5YD STR LF (GAUZE/BANDAGES/DRESSINGS) IMPLANT
CLOTH BEACON ORANGE TIMEOUT ST (SAFETY) ×1 IMPLANT
COUNTER NDL MAGNETIC 40 RED (SET/KITS/TRAYS/PACK) IMPLANT
COUNTER NEEDLE MAGNETIC 40 RED (SET/KITS/TRAYS/PACK) ×1 IMPLANT
COVER LIGHT HANDLE STERIS (MISCELLANEOUS) ×2 IMPLANT
CUFF TRNQT CYL 34X4.125X (TOURNIQUET CUFF) IMPLANT
DRAPE EXTREMITY T 121X128X90 (DISPOSABLE) IMPLANT
DRSG AQUACEL AG ADV 3.5X10 (GAUZE/BANDAGES/DRESSINGS) IMPLANT
ELECTRODE REM PT RTRN 9FT ADLT (ELECTROSURGICAL) ×1 IMPLANT
GLOVE BIOGEL PI IND STRL 7.0 (GLOVE) ×2 IMPLANT
GLOVE BIOGEL PI IND STRL 8.5 (GLOVE) ×1 IMPLANT
GLOVE SKINSENSE STRL SZ8.0 LF (GLOVE) ×1 IMPLANT
GOWN STRL REUS W/TWL LRG LVL3 (GOWN DISPOSABLE) ×1 IMPLANT
GOWN STRL REUS W/TWL XL LVL3 (GOWN DISPOSABLE) ×1 IMPLANT
KIT TURNOVER KIT A (KITS) ×1 IMPLANT
MANIFOLD NEPTUNE II (INSTRUMENTS) ×1 IMPLANT
MARKER SKIN DUAL TIP RULER LAB (MISCELLANEOUS) IMPLANT
NDL HYPO 18GX1.5 BLUNT FILL (NEEDLE) IMPLANT
NEEDLE HYPO 18GX1.5 BLUNT FILL (NEEDLE) ×1 IMPLANT
NS IRRIG 1000ML POUR BTL (IV SOLUTION) ×1 IMPLANT
PACK TOTAL JOINT (CUSTOM PROCEDURE TRAY) IMPLANT
PAD ARMBOARD POSITIONER FOAM (MISCELLANEOUS) ×1 IMPLANT
PENCIL SMOKE EVACUATOR (MISCELLANEOUS) IMPLANT
POSITIONER HEAD 8X9X4 ADT (SOFTGOODS) ×1 IMPLANT
SET BASIN LINEN APH (SET/KITS/TRAYS/PACK) ×1 IMPLANT
SET HNDPC FAN SPRY TIP SCT (DISPOSABLE) ×1 IMPLANT
SOL .9 NS 3000ML IRR UROMATIC (IV SOLUTION) IMPLANT
SUT 3-0 BLK 1X30 PSL (SUTURE) IMPLANT
SUT VIC AB 1 CT1 27XBRD ANTBC (SUTURE) IMPLANT
SWAB CULTURE ESWAB REG 1ML (MISCELLANEOUS) IMPLANT
SYR 30ML LL (SYRINGE) IMPLANT
SYR BULB IRRIG 60ML STRL (SYRINGE) ×1 IMPLANT
YANKAUER SUCT BULB TIP 10FT TU (MISCELLANEOUS) ×1 IMPLANT

## 2024-07-08 NOTE — Interval H&P Note (Signed)
 History and Physical Interval Note:  07/08/2024 11:27 AM  Diana Skinner  has presented today for surgery, with the diagnosis of infection right total knee replacement incision.  The various methods of treatment have been discussed with the patient and family. After consideration of risks, benefits and other options for treatment, the patient has consented to  Procedure(s): IRRIGATION AND DEBRIDEMENT WOUND .incision right total knee possible polyethelyne exchange (Right) as a surgical intervention.  The patient's history has been reviewed, patient examined, no change in status, stable for surgery.  I have reviewed the patient's chart and labs.  Questions were answered to the patient's satisfaction.     Taft Minerva

## 2024-07-08 NOTE — Anesthesia Preprocedure Evaluation (Addendum)
 Anesthesia Evaluation  Patient identified by MRN, date of birth, ID band Patient awake    Reviewed: Allergy & Precautions, H&P , NPO status , Patient's Chart, lab work & pertinent test results  Airway Mallampati: II  TM Distance: >3 FB Neck ROM: Full    Dental no notable dental hx.    Pulmonary asthma    Pulmonary exam normal breath sounds clear to auscultation       Cardiovascular hypertension, Normal cardiovascular exam Rhythm:Regular Rate:Normal     Neuro/Psych  Headaches  Anxiety      negative psych ROS   GI/Hepatic negative GI ROS, Neg liver ROS,,,  Endo/Other  Hypothyroidism    Renal/GU negative Renal ROS  negative genitourinary   Musculoskeletal  (+) Arthritis ,    Abdominal   Peds negative pediatric ROS (+)  Hematology negative hematology ROS (+)   Anesthesia Other Findings   Reproductive/Obstetrics negative OB ROS                              Anesthesia Physical Anesthesia Plan  ASA: 2  Anesthesia Plan: General   Post-op Pain Management:    Induction: Intravenous  PONV Risk Score and Plan:   Airway Management Planned: LMA  Additional Equipment:   Intra-op Plan:   Post-operative Plan: Extubation in OR  Informed Consent: I have reviewed the patients History and Physical, chart, labs and discussed the procedure including the risks, benefits and alternatives for the proposed anesthesia with the patient or authorized representative who has indicated his/her understanding and acceptance.     Dental advisory given  Plan Discussed with: CRNA  Anesthesia Plan Comments:         Anesthesia Quick Evaluation

## 2024-07-08 NOTE — Anesthesia Postprocedure Evaluation (Signed)
 Anesthesia Post Note  Patient: Diana Skinner  Procedure(s) Performed: IRRIGATION AND DEBRIDEMENT WOUND, RIGHT TOTAL KNEE INCISION (Right: Knee) JOINT ASPIRATION RIGHT KNEE (Right: Knee)  Patient location during evaluation: PACU Anesthesia Type: General Level of consciousness: awake and alert Pain management: pain level controlled Vital Signs Assessment: post-procedure vital signs reviewed and stable Respiratory status: spontaneous breathing, nonlabored ventilation, respiratory function stable and patient connected to nasal cannula oxygen Cardiovascular status: blood pressure returned to baseline and stable Postop Assessment: no apparent nausea or vomiting Anesthetic complications: no   No notable events documented.   Last Vitals:  Vitals:   07/08/24 1400 07/08/24 1411  BP: 138/88 137/87  Pulse: 67 77  Resp: 10 16  Temp: 36.4 C 36.4 C  SpO2: 100% 97%    Last Pain:  Vitals:   07/08/24 1411  TempSrc: Oral  PainSc: 5                  Andrea Limes

## 2024-07-08 NOTE — Brief Op Note (Signed)
 07/08/2024  12:53 PM  PATIENT:  Diana Skinner  73 y.o. female  PRE-OPERATIVE DIAGNOSIS:  infection right total knee replacement incision  POST-OPERATIVE DIAGNOSIS:  infection right total knee replacement incision  PROCEDURE:  Procedure(s): IRRIGATION AND DEBRIDEMENT WOUND, RIGHT TOTAL KNEE INCISION (Right) JOINT ASPIRATION RIGHT KNEE (Right)  Findings #1 the joint was aspirated from a separate approach.  There was amber-colored serous fluid approximately 70 cc.  This was sent for culture  Findings #2 the area wound dehiscence showed serosanguineous material and some fat necrosis  The procedure was done as follows The patient was brought to the operating room for general anesthesia  After successful anesthesia the wound was prepped with Betadine  draped sterilely and timeout was completed  The limb was exsanguinated by elevation and wrapping with the Fornage Esmarch the tourniquet was elevated to 280 mmHg  A separate lateral stab wound was made with an 18-gauge needle and the joint was aspirated.  There was amber-colored serous fluid this was cultured and sent to the lab.  The distal portion of the incision which had dehisced and opened up and noted to be draining was opened with sharp incision.  This was carried down through the subcutaneous tissue down to the fascia over the proximal tibia.  This was evaluated and cultured.  This area had serosanguineous fluid as stated with fat necrosis.  This was irrigated.  Finger dissection was carried out there was 1 pocket medially and 1 pocket laterally this was all irrigated and debrided.  #1 Vicryl in interrupted fashion was used to close the wound along with 3-0 nylon suture.  Sterile dressing was applied tourniquet was released.  A Band-Aid was placed over the stab wound    SURGEON:  Surgeons and Role:    * Margrette Taft BRAVO, MD - Primary  PHYSICIAN ASSISTANT:   ASSISTANTS: CYNTHIA WRENN   ANESTHESIA:   general  EBL:  MINIMAL     BLOOD ADMINISTERED:none  DRAINS: none   LOCAL MEDICATIONS USED:  NONE  SPECIMEN:  Source of Specimen:  CULTURE WOUND  and Aspirate KNEE JOINT SEPARATE   DISPOSITION OF SPECIMEN:  MICROBIOLOGY  COUNTS:  YES  TOURNIQUET:   Total Tourniquet Time Documented: Thigh (Right) - 23 minutes Total: Thigh (Right) - 23 minutes   DICTATION: .Nechama Dictation  PLAN OF CARE: Discharge to home after PACU  PATIENT DISPOSITION:  PACU - hemodynamically stable.   Delay start of Pharmacological VTE agent (>24hrs) due to surgical blood loss or risk of bleeding: no

## 2024-07-08 NOTE — Anesthesia Procedure Notes (Signed)
 Procedure Name: Intubation Date/Time: 07/08/2024 12:05 PM  Performed by: Augusta Daved SAILOR, CRNAPre-anesthesia Checklist: Patient identified, Emergency Drugs available, Suction available and Patient being monitored Patient Re-evaluated:Patient Re-evaluated prior to induction Oxygen Delivery Method: Circle System Utilized Preoxygenation: Pre-oxygenation with 100% oxygen Induction Type: IV induction Ventilation: Mask ventilation without difficulty Laryngoscope Size: Miller and 2 Grade View: Grade I Tube type: Oral Tube size: 7.0 mm Number of attempts: 1 Airway Equipment and Method: Stylet Placement Confirmation: ETT inserted through vocal cords under direct vision, positive ETCO2 and breath sounds checked- equal and bilateral Secured at: 21 (at lip) cm Tube secured with: Tape Dental Injury: Teeth and Oropharynx as per pre-operative assessment

## 2024-07-08 NOTE — Transfer of Care (Signed)
 Immediate Anesthesia Transfer of Care Note  Patient: Diana Skinner  Procedure(s) Performed: IRRIGATION AND DEBRIDEMENT WOUND, RIGHT TOTAL KNEE INCISION (Right: Knee) JOINT ASPIRATION RIGHT KNEE (Right: Knee)  Patient Location: PACU  Anesthesia Type:General  Level of Consciousness: oriented, drowsy, and patient cooperative  Airway & Oxygen Therapy: Patient Spontanous Breathing and Patient connected to nasal cannula oxygen  Post-op Assessment: Report given to RN and Post -op Vital signs reviewed and stable  Post vital signs: Reviewed and stable  Last Vitals:  Vitals Value Taken Time  BP 127/82 07/08/24 12:59  Temp    Pulse 82 07/08/24 13:00  Resp 16 07/08/24 13:00  SpO2 97% 07/08/24 13:01  Vitals shown include unfiled device data.  Last Pain:  Vitals:   07/08/24 1026  TempSrc: Oral  PainSc: 0-No pain         Complications: No notable events documented.

## 2024-07-08 NOTE — Brief Op Note (Signed)
 07/08/2024  12:53 PM  PATIENT:  Diana Skinner  73 y.o. female  PRE-OPERATIVE DIAGNOSIS:  infection right total knee replacement incision  POST-OPERATIVE DIAGNOSIS:  infection right total knee replacement incision  PROCEDURE:  Procedure(s): IRRIGATION AND DEBRIDEMENT WOUND, RIGHT TOTAL KNEE INCISION (Right) JOINT ASPIRATION RIGHT KNEE (Right)  SURGEON:  Surgeons and Role:    * Margrette Taft BRAVO, MD - Primary  PHYSICIAN ASSISTANT:   ASSISTANTS: CYNTHIA WRENN   ANESTHESIA:   general  EBL:  MINIMAL    BLOOD ADMINISTERED:none  DRAINS: none   LOCAL MEDICATIONS USED:  NONE  SPECIMEN:  Source of Specimen:  CULTURE WOUND  and Aspirate KNEE JOINT SEPARATE   DISPOSITION OF SPECIMEN:  MICROBIOLOGY  COUNTS:  YES  TOURNIQUET:   Total Tourniquet Time Documented: Thigh (Right) - 23 minutes Total: Thigh (Right) - 23 minutes   DICTATION: .Nechama Dictation  PLAN OF CARE: Discharge to home after PACU  PATIENT DISPOSITION:  PACU - hemodynamically stable.   Delay start of Pharmacological VTE agent (>24hrs) due to surgical blood loss or risk of bleeding: no

## 2024-07-08 NOTE — Op Note (Addendum)
 07/08/2024  12:53 PM  PATIENT:  Diana Skinner  73 y.o. female  PRE-OPERATIVE DIAGNOSIS:  infection right total knee replacement incision  POST-OPERATIVE DIAGNOSIS:  infection right total knee replacement incision  PROCEDURE:  Procedure(s): IRRIGATION AND DEBRIDEMENT WOUND, RIGHT TOTAL KNEE INCISION (Right) JOINT ASPIRATION RIGHT KNEE (Right)  Findings #1 the joint was aspirated from a separate approach.  There was amber-colored serous fluid approximately 70 cc.  This was sent for culture  Findings #2 the area wound dehiscence showed serosanguineous material and some fat necrosis  Note depth of incision superficial above the fascia   The procedure was done as follows The patient was brought to the operating room for general anesthesia  After successful anesthesia the wound was prepped with Betadine  draped sterilely and timeout was completed  The limb was exsanguinated by elevation and wrapping with the Fornage Esmarch the tourniquet was elevated to 280 mmHg  A separate lateral stab wound was made with an 18-gauge needle and the joint was aspirated.  There was amber-colored serous fluid this was cultured and sent to the lab.  The distal portion of the incision which had dehisced and opened up and noted to be draining was opened with sharp incision.  This was carried down through the subcutaneous tissue down to the fascia over the proximal tibia.  This was evaluated and cultured.  This area had serosanguineous fluid as stated with fat necrosis.  This was irrigated.  Finger dissection was carried out there was 1 pocket medially and 1 pocket laterally this was all irrigated and debrided.  #1 Vicryl in interrupted fashion was used to close the wound along with 3-0 nylon suture.  Sterile dressing was applied tourniquet was released.  A Band-Aid was placed over the stab wound    SURGEON:  Surgeons and Role:    * Margrette Taft BRAVO, MD - Primary  PHYSICIAN ASSISTANT:   ASSISTANTS:  CYNTHIA WRENN   ANESTHESIA:   general  EBL:  MINIMAL    BLOOD ADMINISTERED:none  DRAINS: none   LOCAL MEDICATIONS USED:  NONE  SPECIMEN:  Source of Specimen:  CULTURE WOUND  and Aspirate KNEE JOINT SEPARATE   DISPOSITION OF SPECIMEN:  MICROBIOLOGY  COUNTS:  YES  TOURNIQUET:   Total Tourniquet Time Documented: Thigh (Right) - 23 minutes Total: Thigh (Right) - 23 minutes   DICTATION: .Nechama Dictation  PLAN OF CARE: Discharge to home after PACU  PATIENT DISPOSITION:  PACU - hemodynamically stable.   Delay start of Pharmacological VTE agent (>24hrs) due to surgical blood loss or risk of bleeding: no

## 2024-07-09 ENCOUNTER — Encounter (HOSPITAL_COMMUNITY): Payer: Self-pay | Admitting: Orthopedic Surgery

## 2024-07-10 ENCOUNTER — Telehealth: Payer: Self-pay | Admitting: Orthopedic Surgery

## 2024-07-10 NOTE — Telephone Encounter (Signed)
 What do you want me to tell her did you get her yesterday

## 2024-07-10 NOTE — Telephone Encounter (Signed)
 Dr Margrette called her and couldn't get her I called also Dr Margrette called her also / her voice mail is full   Asp negative / and the area was superficial

## 2024-07-10 NOTE — Telephone Encounter (Signed)
 Patient called, she wants to speak to you, stated she's been trying to get you since yesterday.  [phone]

## 2024-07-10 NOTE — Telephone Encounter (Signed)
 Pt lvm for Diana Skinner, returning her call

## 2024-07-11 ENCOUNTER — Other Ambulatory Visit: Payer: Self-pay | Admitting: Orthopedic Surgery

## 2024-07-11 ENCOUNTER — Encounter (HOSPITAL_COMMUNITY)

## 2024-07-11 DIAGNOSIS — L03115 Cellulitis of right lower limb: Secondary | ICD-10-CM

## 2024-07-11 MED ORDER — CLINDAMYCIN HCL 150 MG PO CAPS: 150.0000 mg | ORAL_CAPSULE | Freq: Four times a day (QID) | ORAL | 0 refills | Status: AC

## 2024-07-11 NOTE — Progress Notes (Signed)
 Conversation with Mrs. Sharlet Haymaker  Her medication doxycycline  is somewhat sensitive to the bacteria that was grown from the distal portion of her wound  However better choices are clindamycin  and ciprofloxacin  We went over the risk of each which include tendon rupture for Cipro and GI upset for clindamycin   However I think since we have a total knee in the vicinity that the clindamycin  has the strongest profile against the bacteria and we should try that if we have trouble we can switch to something else   Patient in agreement  Medication called in  Meds ordered this encounter  Medications   clindamycin  (CLEOCIN ) 150 MG capsule    Sig: Take 1 capsule (150 mg total) by mouth 4 (four) times daily for 21 days.    Dispense:  84 capsule    Refill:  0

## 2024-07-13 LAB — AEROBIC/ANAEROBIC CULTURE W GRAM STAIN (SURGICAL/DEEP WOUND)
Culture: NO GROWTH
Gram Stain: NONE SEEN

## 2024-07-14 ENCOUNTER — Encounter: Payer: Self-pay | Admitting: Orthopedic Surgery

## 2024-07-14 ENCOUNTER — Ambulatory Visit (INDEPENDENT_AMBULATORY_CARE_PROVIDER_SITE_OTHER): Admitting: Orthopedic Surgery

## 2024-07-14 DIAGNOSIS — Z96651 Presence of right artificial knee joint: Secondary | ICD-10-CM

## 2024-07-14 NOTE — Progress Notes (Signed)
   Chief Complaint  Patient presents with   Post-op Follow-up    Encounter Diagnosis  Name Primary?   S/P total knee replacement, right 06/17/24 I and D 07/08/24 Yes    DOI/DOS/ Date: 07/08/24  Improved  Pain throbbing sensation have resolved wound still has some bruising around it the erythema has resolved  Check wound possibly remove some of the sutures on Friday  continue clindamycin   This is the distal wound culture   6 d ago   Specimen Description WOUND Performed at [facility], [address], [place], [place] [address]  Special Requests INCISION SITE R KNEE  Gram Stain RARE WBC PRESENT, PREDOMINANTLY PMN RARE GRAM POSITIVE COCCI IN PAIRS  Culture FEW STAPHYLOCOCCUS PSEUDINTERMEDIUS FEW STAPHYLOCOCCUS EPIDERMIDIS NO ANAEROBES ISOLATED Performed at [facility], [address], [place], [place] [address]  Report Status 07/13/2024 FINAL  Organism ID, Bacteria STAPHYLOCOCCUS PSEUDINTERMEDIUS  Organism ID, Bacteria STAPHYLOCOCCUS EPIDERMIDIS  Resulting Agency [facility]     Susceptibility   Staphylococcus pseudintermedius Staphylococcus epidermidis    MIC MIC    CIPROFLOXACIN <=0.5 SENSI... Sensitive <=0.5 SENSI... Sensitive    CLINDAMYCIN  <=0.25 SENS... Sensitive <=0.25 SENS... Sensitive    ERYTHROMYCIN <=0.25 SENS... Sensitive >=8 RESISTANT Resistant    GENTAMICIN  <=0.5 SENSI... Sensitive <=0.5 SENSI... Sensitive    Inducible Clindamycin  NEGATIVE Sensitive NEGATIVE Sensitive    OXACILLIN <=0.25 SENS... Sensitive <=0.25 SENS... Sensitive    RIFAMPIN <=0.5 SENSI... Sensitive <=0.5 SENSI... Sensitive    TETRACYCLINE <=1 SENSITIVE Sensitive <=1 SENSITIVE Sensitive    TRIMETH/SULFA <=10 SENSIT... Sensitive <=10 SENSIT... Sensitive    VANCOMYCIN 1 SENSITIVE Sensitive 2 SENSITIVE Sensitive               This is the joint aspiration    Component Ref Range & Units (hover) 6 d ago  Specimen Description WOUND Performed at [facility], [address],  [place], [place] [address]  Special Requests JOINT ASPIRATIN R KNEE  Gram Stain NO WBC SEEN NO ORGANISMS SEEN  Culture No growth aerobically or anaerobically. Performed at [facility], [address], [place], [place] [address]  Report Status 07/13/2024 FINAL  Resulting Agency [facility]

## 2024-07-14 NOTE — Progress Notes (Signed)
   There were no vitals taken for this visit.  There is no height or weight on file to calculate BMI.  Chief Complaint  Patient presents with   Post-op Follow-up    Encounter Diagnosis  Name Primary?   S/P total knee replacement, right 06/17/24 I and D 07/08/24 Yes    DOI/DOS/ Date: 07/08/24  Improved

## 2024-07-15 ENCOUNTER — Encounter (HOSPITAL_COMMUNITY)

## 2024-07-17 ENCOUNTER — Encounter: Admitting: Orthopedic Surgery

## 2024-07-18 ENCOUNTER — Ambulatory Visit (INDEPENDENT_AMBULATORY_CARE_PROVIDER_SITE_OTHER): Admitting: Orthopedic Surgery

## 2024-07-18 ENCOUNTER — Encounter (HOSPITAL_COMMUNITY)

## 2024-07-18 ENCOUNTER — Encounter: Payer: Self-pay | Admitting: Orthopedic Surgery

## 2024-07-18 DIAGNOSIS — Z96652 Presence of left artificial knee joint: Secondary | ICD-10-CM

## 2024-07-18 DIAGNOSIS — L03115 Cellulitis of right lower limb: Secondary | ICD-10-CM

## 2024-07-18 DIAGNOSIS — Z471 Aftercare following joint replacement surgery: Secondary | ICD-10-CM

## 2024-07-18 DIAGNOSIS — Z96651 Presence of right artificial knee joint: Secondary | ICD-10-CM

## 2024-07-18 NOTE — Progress Notes (Signed)
 Follow-up postop visit Chief Complaint  Patient presents with   Post-op Follow-up    Feels better     Encounter Diagnoses  Name Primary?   S/P total knee replacement, right 06/17/24 I and D 07/08/24 Yes   Cellulitis of right knee    Aftercare following left knee joint replacement surgery     73 year old female status post incision and drainage of right knee distal aspect of the wound  Cultures grew out staph species. IntraOp knee aspiration was negative for bacteria  Wound looks good please see media section for pictures  All sutures were removed Continue clindamycin  Okay to shower with wound covered Return in 2 weeks Okay to return to physical therapy

## 2024-07-18 NOTE — Progress Notes (Signed)
   There were no vitals taken for this visit.  There is no height or weight on file to calculate BMI.  Chief Complaint  Patient presents with   Post-op Follow-up    Feels better     Encounter Diagnosis  Name Primary?   S/P total knee replacement, right 06/17/24 I and D 07/08/24 Yes    DOI/DOS/ Date:    Improved

## 2024-07-22 ENCOUNTER — Encounter (HOSPITAL_COMMUNITY): Payer: Self-pay

## 2024-07-22 ENCOUNTER — Ambulatory Visit (HOSPITAL_COMMUNITY): Attending: Orthopedic Surgery

## 2024-07-22 DIAGNOSIS — R29898 Other symptoms and signs involving the musculoskeletal system: Secondary | ICD-10-CM | POA: Insufficient documentation

## 2024-07-22 DIAGNOSIS — M25661 Stiffness of right knee, not elsewhere classified: Secondary | ICD-10-CM | POA: Insufficient documentation

## 2024-07-22 DIAGNOSIS — Z7409 Other reduced mobility: Secondary | ICD-10-CM | POA: Diagnosis not present

## 2024-07-22 NOTE — Therapy (Signed)
 OUTPATIENT PHYSICAL THERAPY LOWER EXTREMITY EVALUATION   Patient Name: Diana Skinner MRN: [id number] DOB:Mar 06, 1951, 73 y.o., female Today's Date: 07/22/2024  END OF SESSION:  PT End of Session - 07/22/24 1259     Visit Number 2    Number of Visits --    Date for PT Re-Evaluation 08/12/24    Authorization Type [employer] MEDICARE    Authorization Time Period 6 approved from 07/04/24 to 09/05/24    Authorization - Visit Number 1    Authorization - Number of Visits 6    Progress Note Due on Visit 10    PT Start Time 1301    PT Stop Time 1342    PT Time Calculation (min) 41 min    Activity Tolerance Patient tolerated treatment well    Behavior During Therapy [facility] for tasks assessed/performed          Past Medical History:  Diagnosis Date   Anxiety    Arthritis    Asthma    High cholesterol    HTN (hypertension)    Hypothyroidism    Migraines    Past Surgical History:  Procedure Laterality Date   ASPIRATION OF ABSCESS Right 07/08/2024   Procedure: JOINT ASPIRATION RIGHT KNEE;  Surgeon: Margrette Taft BRAVO, MD;  Location: [facility];  Service: Orthopedics;  Laterality: Right;   CATARACT EXTRACTION W/PHACO Right 01/27/2019   Procedure: CATARACT EXTRACTION PHACO AND INTRAOCULAR LENS PLACEMENT (IOC);  Surgeon: Harrie Agent, MD;  Location: [facility];  Service: Ophthalmology;  Laterality: Right;  CDE: 8.33   CATARACT EXTRACTION W/PHACO Left 02/10/2019   Procedure: CATARACT EXTRACTION PHACO AND INTRAOCULAR LENS PLACEMENT (IOC);  Surgeon: Harrie Agent, MD;  Location: [facility];  Service: Ophthalmology;  Laterality: Left;  CDE: 5.49   CESAREAN SECTION     COLONOSCOPY  06/19/2012   Procedure: COLONOSCOPY;  Surgeon: Claudis RAYMOND Rivet, MD;  Location: [facility];  Service: Endoscopy;  Laterality: N/A;  1030   INCISION AND DRAINAGE OF WOUND Right 07/08/2024   Procedure: IRRIGATION AND DEBRIDEMENT WOUND, RIGHT TOTAL KNEE INCISION;  Surgeon: Margrette Taft BRAVO, MD;  Location: [facility];  Service:  Orthopedics;  Laterality: Right;   ORIF RADIAL FRACTURE Right 08/27/2016   Procedure: OPEN REDUCTION INTERNAL FIXATION (ORIF) RADIAL FRACTURE/DISTAL;  Surgeon: Balinda Rogue, MD;  Location: [facility];  Service: Plastics;  Laterality: Right;   TONSILLECTOMY AND ADENOIDECTOMY     TOTAL KNEE ARTHROPLASTY Left 03/25/2024   Procedure: ARTHROPLASTY, KNEE, TOTAL;  Surgeon: Margrette Taft BRAVO, MD;  Location: [facility];  Service: Orthopedics;  Laterality: Left;   TOTAL KNEE ARTHROPLASTY Right 06/17/2024   Procedure: ARTHROPLASTY, KNEE, TOTAL;  Surgeon: Margrette Taft BRAVO, MD;  Location: [facility];  Service: Orthopedics;  Laterality: Right;   Patient Active Problem List   Diagnosis Date Noted   Infection of total knee replacement (HCC) 07/08/2024   Primary osteoarthritis of right knee 06/17/2024   S/P total knee arthroplasty, left 03/25/24 04/08/2024   Primary osteoarthritis of left knee 03/25/2024   Fracture of right wrist 08/27/2016   Bursitis of knee 05/30/2011   KNEE PAIN 01/10/2010   CLOSED FRACTURE OF UPPER END OF TIBIA 01/10/2010   DERANGEMENT MENISCUS 01/03/2010    PCP: Marvine Rush, MD   REFERRING PROVIDER: Margrette Taft BRAVO, MD  REFERRING DIAG: M17.11 (ICD-10-CM) - Primary osteoarthritis of right knee  THERAPY DIAG:  Weakness of right lower extremity  Impaired functional mobility, balance, and endurance  Decreased ROM of right knee  Rationale for Evaluation and Treatment: Rehabilitation  ONSET DATE: 06/17/24 R TKA  SUBJECTIVE:   SUBJECTIVE STATEMENT: Pt states the incision kept draining and pt states doctor went back in and flushed it out and took out some fluid. Pt states she is on a stronger antibiotic 4x per day. Pt states she is walking fine but can tell knee is still weak and needs therapy. Pt states she was cleared to resume the HEP and knee machine at home, has been walking on neighborhood trail.  Pt states that she does has some sharp pain along the front of the right knee  pain. Pt states she had to go before postop due to bleeding of incision. Pt states follow up is Thursday. Pt states she would like to be able to handle the new grandchild. Pt states she had two weeks of home therapy, pt states she only has used the [facility] due to the insistence of the home therapist. Pt states she has been wearing the compression sleeve but not wearing one upon presentation. Pt has used the CPM but has toned it back since she was bleeding some.   PERTINENT HISTORY: 04/25 left knee replacement  PAIN:  Are you having pain? Yes: NPRS scale: 5/10 Pain location: front of knee and anterior thigh Pain description: discomfort, some sharp pain Aggravating factors: bending, squatting, lifting Relieving factors: pain meds, ice  PRECAUTIONS: Knee  RED FLAGS: None   WEIGHT BEARING RESTRICTIONS: No  FALLS:  Has patient fallen in last 6 months? No  LIVING ENVIRONMENT: Lives with: lives with their family and lives with their spouse Lives in: House/apartment Stairs: Yes: External: 5 steps; on right going up Has following equipment at home: Single point cane and Walker - 2 wheeled  OCCUPATION: retired  PLOF: Independent with basic ADLs  PATIENT GOALS: keep up with grandchildren, walk the green way again  NEXT MD VISIT: Thursday the 31st of July  OBJECTIVE:  Note: Objective measures were completed at Evaluation unless otherwise noted.  DIAGNOSTIC FINDINGS: CLINICAL DATA:  Status post right knee arthroplasty   EXAM: PORTABLE RIGHT KNEE - 2 VIEW   COMPARISON:  Right knee radiograph dated 03/03/2024   FINDINGS: Postsurgical changes of right knee arthroplasty. Hardware appears intact and well seated. No acute fracture or dislocation. Midline surgical staples along the anterior knee. Overlying soft tissue swelling and subcutaneous emphysema.   IMPRESSION: Postsurgical changes of right knee arthroplasty.  PATIENT SURVEYS:  LEFS:  45/80  COGNITION: Overall cognitive  status: Within functional limits for tasks assessed     SENSATION: Some numbness and tingling on outside of right knee  EDEMA:  Normal post op edema  PALPATION: Tenderness to anterior knee, tenderness to anterior and posterior proximal thigh  LOWER EXTREMITY ROM:  Active ROM Right eval Left eval  Hip flexion    Hip extension    Hip abduction    Hip adduction    Hip internal rotation    Hip external rotation    Knee flexion 112   Knee extension 5 degrees from neutral   Ankle dorsiflexion    Ankle plantarflexion    Ankle inversion    Ankle eversion     (Blank rows = not tested)  LOWER EXTREMITY MMT:  MMT Right eval Left eval  Hip flexion    Hip extension    Hip abduction    Hip adduction    Hip internal rotation    Hip external rotation    Knee flexion    Knee extension    Ankle dorsiflexion  Ankle plantarflexion    Ankle inversion    Ankle eversion     (Blank rows = not tested)  FUNCTIONAL TESTS:  5 times sit to stand: 13.24 seconds with bilateral UE support 2 minute walk test: 380 feet no AD  SLS 07/01/24: R: 7.03s  L: 8.12s  GAIT: Distance walked: 380 feet, with no AD Assistive device utilized: None Level of assistance: Complete Independence Comments: decreased stance time on RLE noted especially during end of .                                                                                                                                TREATMENT DATE:  07/22/2024  Therapeutic Exercise: -Stationary bike, 5 minutes, level 2 resistance, pt cued for full rotations, able to power bike up, seat 9 -Supine bridges on red theraball 2 sets of 10 reps, 3 second holds, pt cued for max hip extension -DKTC on red exercise ball, 2 sets of 10 reps, pt cued for max hip flexion and knee flexion -Long arc quads, 2 sets of 10 reps, 2 second holds, Pt cued for max quad contraction -Lateral stepping 3 laps 20 feet per lap, second 2 with RTB around ankles, pt cued  for upright posture -Knee drive stretch on 12 inch step, 2 sets of 10 reps, pt cued for max ROM (118 of flexion degrees obtained) -Standing calf stretch, 2 sets of 30 second holds, pt cued for hip extension and anterior pelvic tilt Neuromuscular Re-education: -Aeromat walks in parallel bars, 3 lengths of mat, 3 laps each variation, lateral stepping and tandem stepping, pt cued for one UE support -Sit to stands with tidal tank sphere, 2 sets of 8 reps, pt cued for core activation, and use of BLE equally Therapeutic Activity: -Step up and overs, 6 inch step, 1 set of 8 reps, pt cued for one UE -Lateral step up and overs, 6 inch step, 1 set of 8 reps, pt cued for one UE support   07/01/2024  Evaluation: -ROM measured, Strength assessed, HEP prescribed, pt educated on prognosis, findings, and importance of HEP compliance if given.     PATIENT EDUCATION:  Education details: Pt was educated on findings of PT evaluation, prognosis, frequency of therapy visits and rationale, attendance policy, and HEP if given.   Person educated: Patient Education method: Explanation, Verbal cues, and Handouts Education comprehension: verbalized understanding, verbal cues required, and needs further education  HOME EXERCISE PROGRAM: Access Code: [email or web address] URL: [email or web address] Date: 07/01/2024 Prepared by: Lang Ada  Exercises - Supine Bridge  - 1 x daily - 7 x weekly - 3 sets - 10 reps - 3 second hold - Sidelying Hip Abduction  - 1 x daily - 7 x weekly - 3 sets - 10 reps - Supine Heel Slide  - 1 x daily - 7 x weekly - 3 sets - 10 reps  ASSESSMENT:  CLINICAL IMPRESSION: Patient resumes therapy today following post  surgical complications with weeping incision and some bacteria found, cleared to resume therapy. Patient continues to demonstrate decreased RLE strength, decreased gait quality and balance. Patient also demonstrates good endurance with aerobic based exercise during today's  session. Patient able to progress dynamic knee and core activation exercises today with step up variations, and bridge variations, good performance with verbal cueing. Patient would continue to benefit from skilled physical therapy for increased endurance with ambulation, increased LE strength/ROM, and improved balance for improved quality of life, improved independence with management of right knee and continued progress towards therapy goals.  Patient is a 73 y.o. female who was seen today for physical therapy evaluation and treatment for M17.11 (ICD-10-CM) - Primary osteoarthritis of right knee. Patient demonstrates decreased RLE strength/ROM, abnormal pain of right knee, and impaired balance. Patient also demonstrates difficulty with ambulation during today's session with decreased stride length and velocity noted on RLE especially with prolonged walking. Patient also demonstrates limited knee flexion although postoperative bandaging still donned. Patient requires education on role of PT, importance of progression of exercises and allowing time for proper healing of knee tissues. Patient would benefit from skilled physical therapy for increased endurance with ambulation, increased RLE strength, and balance for improved gait quality, return to higher level of function with ADLs, and progress towards therapy goals.   OBJECTIVE IMPAIRMENTS: Abnormal gait, decreased activity tolerance, decreased balance, decreased endurance, decreased mobility, decreased ROM, decreased strength, and pain.   ACTIVITY LIMITATIONS: carrying, lifting, bending, sitting, standing, squatting, stairs, and transfers  PARTICIPATION LIMITATIONS: meal prep, cleaning, laundry, driving, shopping, community activity, and yard work  PERSONAL FACTORS: Age, Past/current experiences, and 1 comorbidity: LTKA earlier this year are also affecting patient's functional outcome.   REHAB POTENTIAL: Good  CLINICAL DECISION MAKING:  Stable/uncomplicated  EVALUATION COMPLEXITY: Low   GOALS: Goals reviewed with patient? No  SHORT TERM GOALS: Target date: 07/22/24  Patient will demonstrate evidence of independence with individualized HEP and will report compliance for at least 3 days per week for optimized progression towards remaining therapy goals. Baseline:  Goal status: INITIAL  2.  Patient will report a decrease in pain level during community ambulation by at least 2 points for improved quality of life. Baseline: 5/10 Goal status: INITIAL     LONG TERM GOALS: Target date: 08/12/24  Pt will demonstrate a an increase of at least 9 points on the LEFS for improved performance of community ambulation and ADL. Baseline: see objective Goal status: INITIAL  2.  Pt will improve 2 MWT by 50 feet in order to demonstrate improved functional ambulatory capacity in community setting.  Baseline: see objective Goal status: INITIAL  3.  Pt will demonstrate WFL ROM (flexion and extension) in right knee, for increased mobility and maximal efficiency of gait cycle during ambulation. Baseline: see objective Goal status: INITIAL  4.  Pt will demonstrate at least 4-/5 MMT for right lower extremity for increased strength during ADL and community ambulation. Baseline: see objective Goal status: INITIAL  5.  Pt will improve 5TSTS by at least 2.3 seconds in order to improve strength during functional transfers. Baseline: see objective Goal status: INITIAL    PLAN:  PT FREQUENCY: 1-2x/week  PT DURATION: 6 weeks  PLANNED INTERVENTIONS: 97110-Therapeutic exercises, 97530- Therapeutic activity, [id number]- Neuromuscular re-education, 97535- Self Care, [id number]- Manual therapy, [phone]- Gait training, Patient/Family education, Balance training, Stair training, Joint mobilization, DME instructions, Cryotherapy, and Moist heat  PLAN FOR NEXT SESSION: progress RLE strengthening and balance.   Camelia Stelzner, PT,  DPT [facility] Office: [phone] 1:47 PM, 07/22/24

## 2024-07-25 ENCOUNTER — Encounter (HOSPITAL_COMMUNITY): Payer: Self-pay

## 2024-07-25 ENCOUNTER — Ambulatory Visit (HOSPITAL_COMMUNITY)

## 2024-07-25 DIAGNOSIS — M25661 Stiffness of right knee, not elsewhere classified: Secondary | ICD-10-CM

## 2024-07-25 DIAGNOSIS — Z7409 Other reduced mobility: Secondary | ICD-10-CM | POA: Diagnosis not present

## 2024-07-25 DIAGNOSIS — R29898 Other symptoms and signs involving the musculoskeletal system: Secondary | ICD-10-CM | POA: Diagnosis not present

## 2024-07-25 NOTE — Therapy (Signed)
 OUTPATIENT PHYSICAL THERAPY LOWER EXTREMITY EVALUATION   Patient Name: Diana Skinner MRN: [id number] DOB:May 08, 1951, 73 y.o., female Today's Date: 07/25/2024  END OF SESSION:  PT End of Session - 07/25/24 1303     Visit Number 3    Number of Visits 12    Date for PT Re-Evaluation 08/12/24    Authorization Type [employer] MEDICARE    Authorization Time Period 6 approved from 07/04/24 to 09/05/24    Authorization - Visit Number 2    Authorization - Number of Visits 6    Progress Note Due on Visit 10    PT Start Time 1303    PT Stop Time 1343    PT Time Calculation (min) 40 min    Activity Tolerance Patient tolerated treatment well    Behavior During Therapy [facility] for tasks assessed/performed           Past Medical History:  Diagnosis Date   Anxiety    Arthritis    Asthma    High cholesterol    HTN (hypertension)    Hypothyroidism    Migraines    Past Surgical History:  Procedure Laterality Date   ASPIRATION OF ABSCESS Right 07/08/2024   Procedure: JOINT ASPIRATION RIGHT KNEE;  Surgeon: Margrette Taft BRAVO, MD;  Location: [facility];  Service: Orthopedics;  Laterality: Right;   CATARACT EXTRACTION W/PHACO Right 01/27/2019   Procedure: CATARACT EXTRACTION PHACO AND INTRAOCULAR LENS PLACEMENT (IOC);  Surgeon: Harrie Agent, MD;  Location: [facility];  Service: Ophthalmology;  Laterality: Right;  CDE: 8.33   CATARACT EXTRACTION W/PHACO Left 02/10/2019   Procedure: CATARACT EXTRACTION PHACO AND INTRAOCULAR LENS PLACEMENT (IOC);  Surgeon: Harrie Agent, MD;  Location: [facility];  Service: Ophthalmology;  Laterality: Left;  CDE: 5.49   CESAREAN SECTION     COLONOSCOPY  06/19/2012   Procedure: COLONOSCOPY;  Surgeon: Claudis RAYMOND Rivet, MD;  Location: [facility];  Service: Endoscopy;  Laterality: N/A;  1030   INCISION AND DRAINAGE OF WOUND Right 07/08/2024   Procedure: IRRIGATION AND DEBRIDEMENT WOUND, RIGHT TOTAL KNEE INCISION;  Surgeon: Margrette Taft BRAVO, MD;  Location: [facility];  Service:  Orthopedics;  Laterality: Right;   ORIF RADIAL FRACTURE Right 08/27/2016   Procedure: OPEN REDUCTION INTERNAL FIXATION (ORIF) RADIAL FRACTURE/DISTAL;  Surgeon: Balinda Rogue, MD;  Location: [facility];  Service: Plastics;  Laterality: Right;   TONSILLECTOMY AND ADENOIDECTOMY     TOTAL KNEE ARTHROPLASTY Left 03/25/2024   Procedure: ARTHROPLASTY, KNEE, TOTAL;  Surgeon: Margrette Taft BRAVO, MD;  Location: [facility];  Service: Orthopedics;  Laterality: Left;   TOTAL KNEE ARTHROPLASTY Right 06/17/2024   Procedure: ARTHROPLASTY, KNEE, TOTAL;  Surgeon: Margrette Taft BRAVO, MD;  Location: [facility];  Service: Orthopedics;  Laterality: Right;   Patient Active Problem List   Diagnosis Date Noted   Infection of total knee replacement (HCC) 07/08/2024   Primary osteoarthritis of right knee 06/17/2024   S/P total knee arthroplasty, left 03/25/24 04/08/2024   Primary osteoarthritis of left knee 03/25/2024   Fracture of right wrist 08/27/2016   Bursitis of knee 05/30/2011   KNEE PAIN 01/10/2010   CLOSED FRACTURE OF UPPER END OF TIBIA 01/10/2010   DERANGEMENT MENISCUS 01/03/2010    PCP: Marvine Rush, MD   REFERRING PROVIDER: Margrette Taft BRAVO, MD  REFERRING DIAG: M17.11 (ICD-10-CM) - Primary osteoarthritis of right knee  THERAPY DIAG:  Weakness of right lower extremity  Impaired functional mobility, balance, and endurance  Decreased ROM of right knee  Rationale for Evaluation and Treatment:  Rehabilitation  ONSET DATE: 06/17/24 R TKA  SUBJECTIVE:   SUBJECTIVE STATEMENT: Pt states she has noticed the right knee is still a little red and presents with no bandage today. Pt states R knee has not been painful and is not on any pain meds, states the most annoying thing is the numbness.   Pt states that she does has some sharp pain along the front of the right knee pain. Pt states she had to go before postop due to bleeding of incision. Pt states follow up is Thursday. Pt states she would like to be able to  handle the new grandchild. Pt states she had two weeks of home therapy, pt states she only has used the [facility] due to the insistence of the home therapist. Pt states she has been wearing the compression sleeve but not wearing one upon presentation. Pt has used the CPM but has toned it back since she was bleeding some.   PERTINENT HISTORY: 04/25 left knee replacement  PAIN:  Are you having pain? Yes: NPRS scale: 5/10 Pain location: front of knee and anterior thigh Pain description: discomfort, some sharp pain Aggravating factors: bending, squatting, lifting Relieving factors: pain meds, ice  PRECAUTIONS: Knee  RED FLAGS: None   WEIGHT BEARING RESTRICTIONS: No  FALLS:  Has patient fallen in last 6 months? No  LIVING ENVIRONMENT: Lives with: lives with their family and lives with their spouse Lives in: House/apartment Stairs: Yes: External: 5 steps; on right going up Has following equipment at home: Single point cane and Walker - 2 wheeled  OCCUPATION: retired  PLOF: Independent with basic ADLs  PATIENT GOALS: keep up with grandchildren, walk the green way again  NEXT MD VISIT: Thursday the 31st of July  OBJECTIVE:  Note: Objective measures were completed at Evaluation unless otherwise noted.  DIAGNOSTIC FINDINGS: CLINICAL DATA:  Status post right knee arthroplasty   EXAM: PORTABLE RIGHT KNEE - 2 VIEW   COMPARISON:  Right knee radiograph dated 03/03/2024   FINDINGS: Postsurgical changes of right knee arthroplasty. Hardware appears intact and well seated. No acute fracture or dislocation. Midline surgical staples along the anterior knee. Overlying soft tissue swelling and subcutaneous emphysema.   IMPRESSION: Postsurgical changes of right knee arthroplasty.  PATIENT SURVEYS:  LEFS:  45/80  COGNITION: Overall cognitive status: Within functional limits for tasks assessed     SENSATION: Some numbness and tingling on outside of right knee  EDEMA:  Normal post op  edema  PALPATION: Tenderness to anterior knee, tenderness to anterior and posterior proximal thigh  LOWER EXTREMITY ROM:  Active ROM Right eval Left eval  Hip flexion    Hip extension    Hip abduction    Hip adduction    Hip internal rotation    Hip external rotation    Knee flexion 112   Knee extension 5 degrees from neutral   Ankle dorsiflexion    Ankle plantarflexion    Ankle inversion    Ankle eversion     (Blank rows = not tested)  LOWER EXTREMITY MMT:  MMT Right eval Left eval  Hip flexion    Hip extension    Hip abduction    Hip adduction    Hip internal rotation    Hip external rotation    Knee flexion    Knee extension    Ankle dorsiflexion    Ankle plantarflexion    Ankle inversion    Ankle eversion     (Blank rows = not tested)  FUNCTIONAL  TESTS:  5 times sit to stand: 13.24 seconds with bilateral UE support 2 minute walk test: 380 feet no AD  SLS 07/01/24: R: 7.03s  L: 8.12s  GAIT: Distance walked: 380 feet, with no AD Assistive device utilized: None Level of assistance: Complete Independence Comments: decreased stance time on RLE noted especially during end of .                                                                                                                                TREATMENT DATE:  07/25/2024  Therapeutic Exercise: -Stationary bike, 5 minutes, level 1 resistance, pt cued for full rotations, able to power bike up, seat 8 -Supine bridges on green theraball 2 sets of 10 reps, 5 second holds, pt cued for max hip extension, second set hamstring curl added -DKTC on green exercise ball, 2 sets of 10 reps, pt cued for max hip flexion and knee flexion -Supine heel slides, 1 set of 10 reps, 3 second holds, pt cued for controlled motion -SLR, 1 set of 12 reps RLE only, pt cued for maxquad contraction -Knee drive stretch on 12 inch step, 1 sets of 10 reps, pt cued for max ROM (118 of flexion degrees obtained) -Standing calf  stretch, 2 sets of 30 second holds, pt cued for hip extension and anterior pelvic tilt Manual Therapy: -STM to R knee, quads, hamstrings and calf region -Patellar mobilizations, inferior/superior, medial and lateral, 10 reps each direction Therapeutic Activity: -Stair ambulation, 5 bouts, 4 steps, pt able to complete last 2 bouts with no UE support    07/22/2024  Therapeutic Exercise: -Stationary bike, 5 minutes, level 2 resistance, pt cued for full rotations, able to power bike up, seat 9 -Supine bridges on red theraball 2 sets of 10 reps, 3 second holds, pt cued for max hip extension -DKTC on red exercise ball, 2 sets of 10 reps, pt cued for max hip flexion and knee flexion -Long arc quads, 2 sets of 10 reps, 2 second holds, Pt cued for max quad contraction -Lateral stepping 3 laps 20 feet per lap, second 2 with RTB around ankles, pt cued for upright posture -Knee drive stretch on 12 inch step, 2 sets of 10 reps, pt cued for max ROM (118 of flexion degrees obtained) -Standing calf stretch, 2 sets of 30 second holds, pt cued for hip extension and anterior pelvic tilt Neuromuscular Re-education: -Aeromat walks in parallel bars, 3 lengths of mat, 3 laps each variation, lateral stepping and tandem stepping, pt cued for one UE support -Sit to stands with tidal tank sphere, 2 sets of 8 reps, pt cued for core activation, and use of BLE equally Therapeutic Activity: -Step up and overs, 6 inch step, 1 set of 8 reps, pt cued for one UE -Lateral step up and overs, 6 inch step, 1 set of 8 reps, pt cued for one UE support   07/01/2024  Evaluation: -ROM measured, Strength assessed,  HEP prescribed, pt educated on prognosis, findings, and importance of HEP compliance if given.     PATIENT EDUCATION:  Education details: Pt was educated on findings of PT evaluation, prognosis, frequency of therapy visits and rationale, attendance policy, and HEP if given.   Person educated: Patient Education method:  Explanation, Verbal cues, and Handouts Education comprehension: verbalized understanding, verbal cues required, and needs further education  HOME EXERCISE PROGRAM: Access Code: [email or web address] URL: [email or web address] Date: 07/01/2024 Prepared by: Lang Ada  Exercises - Supine Bridge  - 1 x daily - 7 x weekly - 3 sets - 10 reps - 3 second hold - Sidelying Hip Abduction  - 1 x daily - 7 x weekly - 3 sets - 10 reps - Supine Heel Slide  - 1 x daily - 7 x weekly - 3 sets - 10 reps  ASSESSMENT:  CLINICAL IMPRESSION: Pt presents with no bandaging on R knee today. Patient continues to demonstrate decreased RLE strength, fair gait quality and balance. Patient also demonstrates good endurance with aerobic based exercise during today's session. Pt continuing to demonstrate close to 120 degrees of knee flexion and near neutral extension with no pain reported. Patient able to progress dynamic knee and core activation exercises today with stair ambulation and bridge variations, good performance with verbal cueing. Manual therapy completed today due to slight swelling and tracking of patellar mobility. Patient would continue to benefit from skilled physical therapy for increased endurance with ambulation, increased LE strength/ROM, and improved balance for improved quality of life, improved independence with management of right knee and continued progress towards therapy goals.  Patient is a 73 y.o. female who was seen today for physical therapy evaluation and treatment for M17.11 (ICD-10-CM) - Primary osteoarthritis of right knee. Patient demonstrates decreased RLE strength/ROM, abnormal pain of right knee, and impaired balance. Patient also demonstrates difficulty with ambulation during today's session with decreased stride length and velocity noted on RLE especially with prolonged walking. Patient also demonstrates limited knee flexion although postoperative bandaging still donned. Patient  requires education on role of PT, importance of progression of exercises and allowing time for proper healing of knee tissues. Patient would benefit from skilled physical therapy for increased endurance with ambulation, increased RLE strength, and balance for improved gait quality, return to higher level of function with ADLs, and progress towards therapy goals.   OBJECTIVE IMPAIRMENTS: Abnormal gait, decreased activity tolerance, decreased balance, decreased endurance, decreased mobility, decreased ROM, decreased strength, and pain.   ACTIVITY LIMITATIONS: carrying, lifting, bending, sitting, standing, squatting, stairs, and transfers  PARTICIPATION LIMITATIONS: meal prep, cleaning, laundry, driving, shopping, community activity, and yard work  PERSONAL FACTORS: Age, Past/current experiences, and 1 comorbidity: LTKA earlier this year are also affecting patient's functional outcome.   REHAB POTENTIAL: Good  CLINICAL DECISION MAKING: Stable/uncomplicated  EVALUATION COMPLEXITY: Low   GOALS: Goals reviewed with patient? No  SHORT TERM GOALS: Target date: 07/22/24  Patient will demonstrate evidence of independence with individualized HEP and will report compliance for at least 3 days per week for optimized progression towards remaining therapy goals. Baseline:  Goal status: INITIAL  2.  Patient will report a decrease in pain level during community ambulation by at least 2 points for improved quality of life. Baseline: 5/10 Goal status: INITIAL     LONG TERM GOALS: Target date: 08/12/24  Pt will demonstrate a an increase of at least 9 points on the LEFS for improved performance of community ambulation and ADL. Baseline: see objective Goal status: INITIAL  2.  Pt will improve 2 MWT by 50 feet in order to demonstrate improved functional ambulatory capacity in community setting.  Baseline: see objective Goal status: INITIAL  3.  Pt will demonstrate WFL ROM (flexion and extension)  in right knee, for increased mobility and maximal efficiency of gait cycle during ambulation. Baseline: see objective Goal status: INITIAL  4.  Pt will demonstrate at least 4-/5 MMT for right lower extremity for increased strength during ADL and community ambulation. Baseline: see objective Goal status: INITIAL  5.  Pt will improve 5TSTS by at least 2.3 seconds in order to improve strength during functional transfers. Baseline: see objective Goal status: INITIAL    PLAN:  PT FREQUENCY: 1-2x/week  PT DURATION: 6 weeks  PLANNED INTERVENTIONS: 97110-Therapeutic exercises, 97530- Therapeutic activity, [id number]- Neuromuscular re-education, 97535- Self Care, [id number]- Manual therapy, [id number]- Gait training, Patient/Family education, Balance training, Stair training, Joint mobilization, DME instructions, Cryotherapy, and Moist heat  PLAN FOR NEXT SESSION: progress RLE strengthening and balance.   Ailin Rochford, PT, DPT [facility] Office: [phone] 1:50 PM, 07/25/24

## 2024-07-29 ENCOUNTER — Ambulatory Visit (HOSPITAL_COMMUNITY)

## 2024-07-29 ENCOUNTER — Encounter (HOSPITAL_COMMUNITY): Payer: Self-pay

## 2024-07-29 DIAGNOSIS — R29898 Other symptoms and signs involving the musculoskeletal system: Secondary | ICD-10-CM

## 2024-07-29 DIAGNOSIS — Z7409 Other reduced mobility: Secondary | ICD-10-CM

## 2024-07-29 DIAGNOSIS — M25661 Stiffness of right knee, not elsewhere classified: Secondary | ICD-10-CM | POA: Diagnosis not present

## 2024-07-29 NOTE — Therapy (Signed)
 OUTPATIENT PHYSICAL THERAPY LOWER EXTREMITY TREATMENT   Patient Name: Diana Skinner MRN: [id number] DOB:[birth year], 73 y.o., female Today's Date: 07/29/2024  END OF SESSION:  PT End of Session - 07/29/24 1300     Visit Number 4    Number of Visits 12    Date for PT Re-Evaluation 08/12/24    Authorization Type [employer] MEDICARE    Authorization Time Period 6 approved from 07/04/24 to 09/05/24    Authorization - Visit Number 3    Authorization - Number of Visits 6    Progress Note Due on Visit 10    PT Start Time 1300    PT Stop Time 1340    PT Time Calculation (min) 40 min    Activity Tolerance Patient tolerated treatment well    Behavior During Therapy [facility] for tasks assessed/performed            Past Medical History:  Diagnosis Date   Anxiety    Arthritis    Asthma    High cholesterol    HTN (hypertension)    Hypothyroidism    Migraines    Past Surgical History:  Procedure Laterality Date   ASPIRATION OF ABSCESS Right 07/08/2024   Procedure: JOINT ASPIRATION RIGHT KNEE;  Surgeon: Margrette Taft BRAVO, MD;  Location: [facility];  Service: Orthopedics;  Laterality: Right;   CATARACT EXTRACTION W/PHACO Right 01/27/2019   Procedure: CATARACT EXTRACTION PHACO AND INTRAOCULAR LENS PLACEMENT (IOC);  Surgeon: Harrie Agent, MD;  Location: [facility];  Service: Ophthalmology;  Laterality: Right;  CDE: 8.33   CATARACT EXTRACTION W/PHACO Left 02/10/2019   Procedure: CATARACT EXTRACTION PHACO AND INTRAOCULAR LENS PLACEMENT (IOC);  Surgeon: Harrie Agent, MD;  Location: [facility];  Service: Ophthalmology;  Laterality: Left;  CDE: 5.49   CESAREAN SECTION     COLONOSCOPY  06/19/2012   Procedure: COLONOSCOPY;  Surgeon: Claudis RAYMOND Rivet, MD;  Location: [facility];  Service: Endoscopy;  Laterality: N/A;  1030   INCISION AND DRAINAGE OF WOUND Right 07/08/2024   Procedure: IRRIGATION AND DEBRIDEMENT WOUND, RIGHT TOTAL KNEE INCISION;  Surgeon: Margrette Taft BRAVO, MD;  Location: [facility];  Service:  Orthopedics;  Laterality: Right;   ORIF RADIAL FRACTURE Right 08/27/2016   Procedure: OPEN REDUCTION INTERNAL FIXATION (ORIF) RADIAL FRACTURE/DISTAL;  Surgeon: Balinda Rogue, MD;  Location: [facility];  Service: Plastics;  Laterality: Right;   TONSILLECTOMY AND ADENOIDECTOMY     TOTAL KNEE ARTHROPLASTY Left 03/25/2024   Procedure: ARTHROPLASTY, KNEE, TOTAL;  Surgeon: Margrette Taft BRAVO, MD;  Location: [facility];  Service: Orthopedics;  Laterality: Left;   TOTAL KNEE ARTHROPLASTY Right 06/17/2024   Procedure: ARTHROPLASTY, KNEE, TOTAL;  Surgeon: Margrette Taft BRAVO, MD;  Location: [facility];  Service: Orthopedics;  Laterality: Right;   Patient Active Problem List   Diagnosis Date Noted   Infection of total knee replacement (HCC) 07/08/2024   Primary osteoarthritis of right knee 06/17/2024   S/P total knee arthroplasty, left 03/25/24 04/08/2024   Primary osteoarthritis of left knee 03/25/2024   Fracture of right wrist 08/27/2016   Bursitis of knee 05/30/2011   KNEE PAIN 01/10/2010   CLOSED FRACTURE OF UPPER END OF TIBIA 01/10/2010   DERANGEMENT MENISCUS 01/03/2010    PCP: Marvine Rush, MD   REFERRING PROVIDER: Margrette Taft BRAVO, MD  REFERRING DIAG: M17.11 (ICD-10-CM) - Primary osteoarthritis of right knee  THERAPY DIAG:  Weakness of right lower extremity  Impaired functional mobility, balance, and endurance  Decreased ROM of right knee  Rationale for Evaluation and  Treatment: Rehabilitation  ONSET DATE: 06/17/24 R TKA  SUBJECTIVE:   SUBJECTIVE STATEMENT: Pt states HEP is going well and being able to watch both grandchildren by herself this morning with not much difficulty. Pt reports leftover stitch has been bothering her and has kept incision covered to ensure no bumping it or picking at the stitch.   Pt states that she does has some sharp pain along the front of the right knee pain. Pt states she had to go before postop due to bleeding of incision. Pt states follow up is Thursday.  Pt states she would like to be able to handle the new grandchild. Pt states she had two weeks of home therapy, pt states she only has used the [facility] due to the insistence of the home therapist. Pt states she has been wearing the compression sleeve but not wearing one upon presentation. Pt has used the CPM but has toned it back since she was bleeding some.   PERTINENT HISTORY: 04/25 left knee replacement  PAIN:  Are you having pain? Yes: NPRS scale: 5/10 Pain location: front of knee and anterior thigh Pain description: discomfort, some sharp pain Aggravating factors: bending, squatting, lifting Relieving factors: pain meds, ice  PRECAUTIONS: Knee  RED FLAGS: None   WEIGHT BEARING RESTRICTIONS: No  FALLS:  Has patient fallen in last 6 months? No  LIVING ENVIRONMENT: Lives with: lives with their family and lives with their spouse Lives in: House/apartment Stairs: Yes: External: 5 steps; on right going up Has following equipment at home: Single point cane and Walker - 2 wheeled  OCCUPATION: retired  PLOF: Independent with basic ADLs  PATIENT GOALS: keep up with grandchildren, walk the green way again  NEXT MD VISIT: Thursday the 31st of July  OBJECTIVE:  Note: Objective measures were completed at Evaluation unless otherwise noted.  DIAGNOSTIC FINDINGS: CLINICAL DATA:  Status post right knee arthroplasty   EXAM: PORTABLE RIGHT KNEE - 2 VIEW   COMPARISON:  Right knee radiograph dated 03/03/2024   FINDINGS: Postsurgical changes of right knee arthroplasty. Hardware appears intact and well seated. No acute fracture or dislocation. Midline surgical staples along the anterior knee. Overlying soft tissue swelling and subcutaneous emphysema.   IMPRESSION: Postsurgical changes of right knee arthroplasty.  PATIENT SURVEYS:  LEFS:  45/80  COGNITION: Overall cognitive status: Within functional limits for tasks assessed     SENSATION: Some numbness and tingling on outside of  right knee  EDEMA:  Normal post op edema  PALPATION: Tenderness to anterior knee, tenderness to anterior and posterior proximal thigh  LOWER EXTREMITY ROM:  Active ROM Right eval Left eval  Hip flexion    Hip extension    Hip abduction    Hip adduction    Hip internal rotation    Hip external rotation    Knee flexion 112   Knee extension 5 degrees from neutral   Ankle dorsiflexion    Ankle plantarflexion    Ankle inversion    Ankle eversion     (Blank rows = not tested)  LOWER EXTREMITY MMT:  MMT Right eval Left eval  Hip flexion    Hip extension    Hip abduction    Hip adduction    Hip internal rotation    Hip external rotation    Knee flexion    Knee extension    Ankle dorsiflexion    Ankle plantarflexion    Ankle inversion    Ankle eversion     (Blank rows = not  tested)  FUNCTIONAL TESTS:  5 times sit to stand: 13.24 seconds with bilateral UE support 2 minute walk test: 380 feet no AD  SLS 07/01/24: R: 7.03s  L: 8.12s  GAIT: Distance walked: 380 feet, with no AD Assistive device utilized: None Level of assistance: Complete Independence Comments: decreased stance time on RLE noted especially during end of .                                                                                                                                TREATMENT DATE:  07/29/2024  Therapeutic Exercise: -Stationary bike, 5 minutes, level 4 resistance, pt cued for full rotations, able to power bike up, seat 9 -3 way kick with 3lb ankle weights, 1 set of 7 reps, bilaterally, pt cued for maxquad contraction -Standing marches/butt kicks with 3lb ankle weights, 30 second bouts, 2 bouts each, pt cued for max hip and knee ROM -Knee drive stretch on 12 inch step, 1 sets of 10 reps, 2 second hold, pt cued for max ROM (121 of flexion degrees obtained) -TKE on 4 inch step, 2 sets of 8 reps, pt cued for decreased UE support -Seated hamstring stretch, 2 sets of 30 second holds, pt  cued for hip extension and anterior pelvic tilt Neuromuscular Re-education: -Aeromat walks in parallel bars with 3lb ankle weights, 3 lengths of mat, 3 laps each variation, lateral stepping and tandem stepping, pt cued for one UE support -Sit to stands to SLS with yellow ball trampoline toss, 1 sets of 5 reps, 3 tosses per rep, pt cued for core activation, and use of BLE equally Therapeutic Activity: -Step up and overs, 8 inch step, 2 set of 8 reps, pt cued for one UE -Lateral step up and overs, 8 inch step, 1 set of 8 reps, pt cued for one UE support  07/25/2024  Therapeutic Exercise: -Stationary bike, 5 minutes, level 1 resistance, pt cued for full rotations, able to power bike up, seat 8 -Supine bridges on green theraball 2 sets of 10 reps, 5 second holds, pt cued for max hip extension, second set hamstring curl added -DKTC on green exercise ball, 2 sets of 10 reps, pt cued for max hip flexion and knee flexion -Supine heel slides, 1 set of 10 reps, 3 second holds, pt cued for controlled motion -SLR, 1 set of 12 reps RLE only, pt cued for maxquad contraction -Knee drive stretch on 12 inch step, 1 sets of 10 reps, pt cued for max ROM (118 of flexion degrees obtained) -Standing calf stretch, 2 sets of 30 second holds, pt cued for hip extension and anterior pelvic tilt Manual Therapy: -STM to R knee, quads, hamstrings and calf region -Patellar mobilizations, inferior/superior, medial and lateral, 10 reps each direction Therapeutic Activity: -Stair ambulation, 5 bouts, 4 steps, pt able to complete last 2 bouts with no UE support    07/22/2024  Therapeutic Exercise: -Stationary bike, 5 minutes, level 2  resistance, pt cued for full rotations, able to power bike up, seat 9 -Supine bridges on red theraball 2 sets of 10 reps, 3 second holds, pt cued for max hip extension -DKTC on red exercise ball, 2 sets of 10 reps, pt cued for max hip flexion and knee flexion -Long arc quads, 2 sets of 10 reps,  2 second holds, Pt cued for max quad contraction -Lateral stepping 3 laps 20 feet per lap, second 2 with RTB around ankles, pt cued for upright posture -Knee drive stretch on 12 inch step, 2 sets of 10 reps, pt cued for max ROM (118 of flexion degrees obtained) -Standing calf stretch, 2 sets of 30 second holds, pt cued for hip extension and anterior pelvic tilt Neuromuscular Re-education: -Aeromat walks in parallel bars, 3 lengths of mat, 3 laps each variation, lateral stepping and tandem stepping, pt cued for one UE support -Sit to stands with tidal tank sphere, 2 sets of 8 reps, pt cued for core activation, and use of BLE equally Therapeutic Activity: -Step up and overs, 6 inch step, 1 set of 8 reps, pt cued for one UE -Lateral step up and overs, 6 inch step, 1 set of 8 reps, pt cued for one UE support   PATIENT EDUCATION:  Education details: Pt was educated on findings of PT evaluation, prognosis, frequency of therapy visits and rationale, attendance policy, and HEP if given.   Person educated: Patient Education method: Explanation, Verbal cues, and Handouts Education comprehension: verbalized understanding, verbal cues required, and needs further education  HOME EXERCISE PROGRAM: Access Code: [email or web address] URL: [email or web address] Date: 07/01/2024 Prepared by: Lang Ada  Exercises - Supine Bridge  - 1 x daily - 7 x weekly - 3 sets - 10 reps - 3 second hold - Sidelying Hip Abduction  - 1 x daily - 7 x weekly - 3 sets - 10 reps - Supine Heel Slide  - 1 x daily - 7 x weekly - 3 sets - 10 reps  ASSESSMENT:  CLINICAL IMPRESSION: Pt presents with bandaging on R knee today. Patient continues to demonstrate decreased RLE strength, fair gait quality and balance. Patient also demonstrates good endurance with aerobic based exercise during today's session. Pt continuing to demonstrate 121 degrees of knee flexion and neutral extension with no pain reported. Patient able to  progress dynamic knee and core activation exercises today with SLS activities and aeromat walks with ankle weights, good performance with verbal cueing. Patient would continue to benefit from skilled physical therapy for increased endurance with ambulation, increased LE strength/ROM, and improved balance for improved quality of life, improved independence with management of right knee and continued progress towards therapy goals.  Patient is a 73 y.o. female who was seen today for physical therapy evaluation and treatment for M17.11 (ICD-10-CM) - Primary osteoarthritis of right knee. Patient demonstrates decreased RLE strength/ROM, abnormal pain of right knee, and impaired balance. Patient also demonstrates difficulty with ambulation during today's session with decreased stride length and velocity noted on RLE especially with prolonged walking. Patient also demonstrates limited knee flexion although postoperative bandaging still donned. Patient requires education on role of PT, importance of progression of exercises and allowing time for proper healing of knee tissues. Patient would benefit from skilled physical therapy for increased endurance with ambulation, increased RLE strength, and balance for improved gait quality, return to higher level of function with ADLs, and progress towards therapy goals.   OBJECTIVE IMPAIRMENTS: Abnormal gait, decreased activity tolerance, decreased balance, decreased endurance, decreased  mobility, decreased ROM, decreased strength, and pain.   ACTIVITY LIMITATIONS: carrying, lifting, bending, sitting, standing, squatting, stairs, and transfers  PARTICIPATION LIMITATIONS: meal prep, cleaning, laundry, driving, shopping, community activity, and yard work  PERSONAL FACTORS: Age, Past/current experiences, and 1 comorbidity: LTKA earlier this year are also affecting patient's functional outcome.   REHAB POTENTIAL: Good  CLINICAL DECISION MAKING:  Stable/uncomplicated  EVALUATION COMPLEXITY: Low   GOALS: Goals reviewed with patient? No  SHORT TERM GOALS: Target date: 07/22/24  Patient will demonstrate evidence of independence with individualized HEP and will report compliance for at least 3 days per week for optimized progression towards remaining therapy goals. Baseline:  Goal status: INITIAL  2.  Patient will report a decrease in pain level during community ambulation by at least 2 points for improved quality of life. Baseline: 5/10 Goal status: INITIAL     LONG TERM GOALS: Target date: 08/12/24  Pt will demonstrate a an increase of at least 9 points on the LEFS for improved performance of community ambulation and ADL. Baseline: see objective Goal status: INITIAL  2.  Pt will improve 2 MWT by 50 feet in order to demonstrate improved functional ambulatory capacity in community setting.  Baseline: see objective Goal status: INITIAL  3.  Pt will demonstrate WFL ROM (flexion and extension) in right knee, for increased mobility and maximal efficiency of gait cycle during ambulation. Baseline: see objective Goal status: INITIAL  4.  Pt will demonstrate at least 4-/5 MMT for right lower extremity for increased strength during ADL and community ambulation. Baseline: see objective Goal status: INITIAL  5.  Pt will improve 5TSTS by at least 2.3 seconds in order to improve strength during functional transfers. Baseline: see objective Goal status: INITIAL    PLAN:  PT FREQUENCY: 1-2x/week  PT DURATION: 6 weeks  PLANNED INTERVENTIONS: 97110-Therapeutic exercises, 97530- Therapeutic activity, [id number]- Neuromuscular re-education, 97535- Self Care, [id number]- Manual therapy, [phone]- Gait training, Patient/Family education, Balance training, Stair training, Joint mobilization, DME instructions, Cryotherapy, and Moist heat  PLAN FOR NEXT SESSION: progress RLE strengthening and balance.   Tigerlily Christine, PT, DPT [facility] Office: [phone] 1:45 PM, 07/29/24

## 2024-08-01 ENCOUNTER — Ambulatory Visit (HOSPITAL_COMMUNITY)

## 2024-08-01 ENCOUNTER — Ambulatory Visit (INDEPENDENT_AMBULATORY_CARE_PROVIDER_SITE_OTHER): Admitting: Orthopedic Surgery

## 2024-08-01 DIAGNOSIS — Z7409 Other reduced mobility: Secondary | ICD-10-CM

## 2024-08-01 DIAGNOSIS — L03115 Cellulitis of right lower limb: Secondary | ICD-10-CM

## 2024-08-01 DIAGNOSIS — M25661 Stiffness of right knee, not elsewhere classified: Secondary | ICD-10-CM

## 2024-08-01 DIAGNOSIS — R29898 Other symptoms and signs involving the musculoskeletal system: Secondary | ICD-10-CM | POA: Diagnosis not present

## 2024-08-01 DIAGNOSIS — Z96652 Presence of left artificial knee joint: Secondary | ICD-10-CM

## 2024-08-01 DIAGNOSIS — Z96651 Presence of right artificial knee joint: Secondary | ICD-10-CM

## 2024-08-01 DIAGNOSIS — Z471 Aftercare following joint replacement surgery: Secondary | ICD-10-CM

## 2024-08-01 NOTE — Therapy (Signed)
 OUTPATIENT PHYSICAL THERAPY LOWER EXTREMITY TREATMENT   Patient Name: Diana Skinner MRN: [id number] DOB:Jan 24, 1951, 73 y.o., female Today's Date: 08/01/2024  END OF SESSION:  PT End of Session - 08/01/24 1300     Visit Number 5    Number of Visits 12    Date for PT Re-Evaluation 08/12/24    Authorization Type [employer] MEDICARE    Authorization Time Period 6 approved from 07/04/24 to 09/05/24    Authorization - Visit Number 4    Authorization - Number of Visits 6    Progress Note Due on Visit 10    PT Start Time 1300    PT Stop Time 1340    PT Time Calculation (min) 40 min    Activity Tolerance Patient tolerated treatment well    Behavior During Therapy [facility] for tasks assessed/performed             Past Medical History:  Diagnosis Date   Anxiety    Arthritis    Asthma    High cholesterol    HTN (hypertension)    Hypothyroidism    Migraines    Past Surgical History:  Procedure Laterality Date   ASPIRATION OF ABSCESS Right 07/08/2024   Procedure: JOINT ASPIRATION RIGHT KNEE;  Surgeon: Margrette Taft BRAVO, MD;  Location: [facility];  Service: Orthopedics;  Laterality: Right;   CATARACT EXTRACTION W/PHACO Right 01/27/2019   Procedure: CATARACT EXTRACTION PHACO AND INTRAOCULAR LENS PLACEMENT (IOC);  Surgeon: Harrie Agent, MD;  Location: [facility];  Service: Ophthalmology;  Laterality: Right;  CDE: 8.33   CATARACT EXTRACTION W/PHACO Left 02/10/2019   Procedure: CATARACT EXTRACTION PHACO AND INTRAOCULAR LENS PLACEMENT (IOC);  Surgeon: Harrie Agent, MD;  Location: [facility];  Service: Ophthalmology;  Laterality: Left;  CDE: 5.49   CESAREAN SECTION     COLONOSCOPY  06/19/2012   Procedure: COLONOSCOPY;  Surgeon: Claudis RAYMOND Rivet, MD;  Location: [facility];  Service: Endoscopy;  Laterality: N/A;  1030   INCISION AND DRAINAGE OF WOUND Right 07/08/2024   Procedure: IRRIGATION AND DEBRIDEMENT WOUND, RIGHT TOTAL KNEE INCISION;  Surgeon: Margrette Taft BRAVO, MD;  Location: [facility];  Service:  Orthopedics;  Laterality: Right;   ORIF RADIAL FRACTURE Right 08/27/2016   Procedure: OPEN REDUCTION INTERNAL FIXATION (ORIF) RADIAL FRACTURE/DISTAL;  Surgeon: Balinda Rogue, MD;  Location: [facility];  Service: Plastics;  Laterality: Right;   TONSILLECTOMY AND ADENOIDECTOMY     TOTAL KNEE ARTHROPLASTY Left 03/25/2024   Procedure: ARTHROPLASTY, KNEE, TOTAL;  Surgeon: Margrette Taft BRAVO, MD;  Location: [facility];  Service: Orthopedics;  Laterality: Left;   TOTAL KNEE ARTHROPLASTY Right 06/17/2024   Procedure: ARTHROPLASTY, KNEE, TOTAL;  Surgeon: Margrette Taft BRAVO, MD;  Location: [facility];  Service: Orthopedics;  Laterality: Right;   Patient Active Problem List   Diagnosis Date Noted   Infection of total knee replacement (HCC) 07/08/2024   Primary osteoarthritis of right knee 06/17/2024   S/P total knee arthroplasty, left 03/25/24 04/08/2024   Primary osteoarthritis of left knee 03/25/2024   Fracture of right wrist 08/27/2016   Bursitis of knee 05/30/2011   KNEE PAIN 01/10/2010   CLOSED FRACTURE OF UPPER END OF TIBIA 01/10/2010   DERANGEMENT MENISCUS 01/03/2010    PCP: Marvine Rush, MD   REFERRING PROVIDER: Margrette Taft BRAVO, MD  REFERRING DIAG: M17.11 (ICD-10-CM) - Primary osteoarthritis of right knee  THERAPY DIAG:  Weakness of right lower extremity  Impaired functional mobility, balance, and endurance  Decreased ROM of right knee  Rationale for Evaluation  and Treatment: Rehabilitation  ONSET DATE: 06/17/24 R TKA  SUBJECTIVE:   SUBJECTIVE STATEMENT: Pt states that she was a little sore after last session, was able to walk and everything. Pt states she went into the ortho doc for follow up, stitches still needing to be covered with bandages.    Pt states that she does has some sharp pain along the front of the right knee pain. Pt states she had to go before postop due to bleeding of incision. Pt states follow up is Thursday. Pt states she would like to be able to handle the new  grandchild. Pt states she had two weeks of home therapy, pt states she only has used the [facility] due to the insistence of the home therapist. Pt states she has been wearing the compression sleeve but not wearing one upon presentation. Pt has used the CPM but has toned it back since she was bleeding some.   PERTINENT HISTORY: 04/25 left knee replacement  PAIN:  Are you having pain? Yes: NPRS scale: 5/10 Pain location: front of knee and anterior thigh Pain description: discomfort, some sharp pain Aggravating factors: bending, squatting, lifting Relieving factors: pain meds, ice  PRECAUTIONS: Knee  RED FLAGS: None   WEIGHT BEARING RESTRICTIONS: No  FALLS:  Has patient fallen in last 6 months? No  LIVING ENVIRONMENT: Lives with: lives with their family and lives with their spouse Lives in: House/apartment Stairs: Yes: External: 5 steps; on right going up Has following equipment at home: Single point cane and Walker - 2 wheeled  OCCUPATION: retired  PLOF: Independent with basic ADLs  PATIENT GOALS: keep up with grandchildren, walk the green way again  NEXT MD VISIT: Thursday the 31st of July  OBJECTIVE:  Note: Objective measures were completed at Evaluation unless otherwise noted.  DIAGNOSTIC FINDINGS: CLINICAL DATA:  Status post right knee arthroplasty   EXAM: PORTABLE RIGHT KNEE - 2 VIEW   COMPARISON:  Right knee radiograph dated 03/03/2024   FINDINGS: Postsurgical changes of right knee arthroplasty. Hardware appears intact and well seated. No acute fracture or dislocation. Midline surgical staples along the anterior knee. Overlying soft tissue swelling and subcutaneous emphysema.   IMPRESSION: Postsurgical changes of right knee arthroplasty.  PATIENT SURVEYS:  LEFS:  45/80  COGNITION: Overall cognitive status: Within functional limits for tasks assessed     SENSATION: Some numbness and tingling on outside of right knee  EDEMA:  Normal post op  edema  PALPATION: Tenderness to anterior knee, tenderness to anterior and posterior proximal thigh  LOWER EXTREMITY ROM:  Active ROM Right eval Left eval  Hip flexion    Hip extension    Hip abduction    Hip adduction    Hip internal rotation    Hip external rotation    Knee flexion 112   Knee extension 5 degrees from neutral   Ankle dorsiflexion    Ankle plantarflexion    Ankle inversion    Ankle eversion     (Blank rows = not tested)  LOWER EXTREMITY MMT:  MMT Right eval Left eval  Hip flexion    Hip extension    Hip abduction    Hip adduction    Hip internal rotation    Hip external rotation    Knee flexion    Knee extension    Ankle dorsiflexion    Ankle plantarflexion    Ankle inversion    Ankle eversion     (Blank rows = not tested)  FUNCTIONAL TESTS:  5  times sit to stand: 13.24 seconds with bilateral UE support 2 minute walk test: 380 feet no AD  SLS 07/01/24: R: 7.03s  L: 8.12s  GAIT: Distance walked: 380 feet, with no AD Assistive device utilized: None Level of assistance: Complete Independence Comments: decreased stance time on RLE noted especially during end of .                                                                                                                                TREATMENT DATE:  08/01/2024  Therapeutic Exercise: -Stationary bike, 5 minutes, level 5 resistance, pt cued for full rotations, able to power bike up, seat 10 -Bosu ball lunges, 1 set of 8 reps bilaterally, pt cued for proper foot placement -Bosu ball step up and overs, 2 sets of 5 reps, pt cued for decreased UE support and increased R knee extension -Hip flexions with 5lb Kettle bell on toes, 2 sets of 6 reps bilaterally, pt cued for increased ROM -Knee drive stretch on 12 inch step, 1 sets of 7 reps, 2 second hold, pt cued for max ROM (121 of flexion degrees obtained, and 0 degrees of extension) -TKE on 6 inch step, 2 sets of 10 (only 8 on second  set)reps, pt cued for decreased UE support -Seated hamstring stretch, 2 sets of 30 second holds, pt cued for hip extension and anterior pelvic tilt Neuromuscular Re-education: -Aeromat walks in parallel bars with 3lb ankle weights, 3 lengths of mat, 3 laps each variation, lateral stepping and tandem stepping, pt cued for one UE support -SLS 2 bouts of 30 seconds bilaterally, pt cued for decreased UE support and UE compensation instead of LE compensation Therapeutic Activity: -Sled pushes/pulls, 20lbs, 3 laps, 50 feet per lap, pt cued for increased ankle and knee mobility throughout -Aeromat walks tandem stepping and lateral stepping with no UE support u, 3 laps, SBA   07/29/2024  Therapeutic Exercise: -Stationary bike, 5 minutes, level 4 resistance, pt cued for full rotations, able to power bike up, seat 9 -3 way kick with 3lb ankle weights, 1 set of 7 reps, bilaterally, pt cued for maxquad contraction -Standing marches/butt kicks with 3lb ankle weights, 30 second bouts, 2 bouts each, pt cued for max hip and knee ROM -Knee drive stretch on 12 inch step, 1 sets of 10 reps, 2 second hold, pt cued for max ROM (121 of flexion degrees obtained) -TKE on 4 inch step, 2 sets of 8 reps, pt cued for decreased UE support -Seated hamstring stretch, 2 sets of 30 second holds, pt cued for hip extension and anterior pelvic tilt Neuromuscular Re-education: -Aeromat walks in parallel bars with 3lb ankle weights, 3 lengths of mat, 3 laps each variation, lateral stepping and tandem stepping, pt cued for one UE support -Sit to stands to SLS with yellow ball trampoline toss, 1 sets of 5 reps, 3 tosses per rep, pt cued for core activation, and use of BLE  equally Therapeutic Activity: -Step up and overs, 8 inch step, 2 set of 8 reps, pt cued for one UE -Lateral step up and overs, 8 inch step, 1 set of 8 reps, pt cued for one UE support  07/25/2024  Therapeutic Exercise: -Stationary bike, 5 minutes, level 1  resistance, pt cued for full rotations, able to power bike up, seat 8 -Supine bridges on green theraball 2 sets of 10 reps, 5 second holds, pt cued for max hip extension, second set hamstring curl added -DKTC on green exercise ball, 2 sets of 10 reps, pt cued for max hip flexion and knee flexion -Supine heel slides, 1 set of 10 reps, 3 second holds, pt cued for controlled motion -SLR, 1 set of 12 reps RLE only, pt cued for maxquad contraction -Knee drive stretch on 12 inch step, 1 sets of 10 reps, pt cued for max ROM (118 of flexion degrees obtained) -Standing calf stretch, 2 sets of 30 second holds, pt cued for hip extension and anterior pelvic tilt Manual Therapy: -STM to R knee, quads, hamstrings and calf region -Patellar mobilizations, inferior/superior, medial and lateral, 10 reps each direction Therapeutic Activity: -Stair ambulation, 5 bouts, 4 steps, pt able to complete last 2 bouts with no UE support    PATIENT EDUCATION:  Education details: Pt was educated on findings of PT evaluation, prognosis, frequency of therapy visits and rationale, attendance policy, and HEP if given.   Person educated: Patient Education method: Explanation, Verbal cues, and Handouts Education comprehension: verbalized understanding, verbal cues required, and needs further education  HOME EXERCISE PROGRAM: Access Code: [email or web address] URL: [email or web address] Date: 07/01/2024 Prepared by: Lang Ada  Exercises - Supine Bridge  - 1 x daily - 7 x weekly - 3 sets - 10 reps - 3 second hold - Sidelying Hip Abduction  - 1 x daily - 7 x weekly - 3 sets - 10 reps - Supine Heel Slide  - 1 x daily - 7 x weekly - 3 sets - 10 reps  ASSESSMENT:  CLINICAL IMPRESSION: Patient continues to demonstrate improved RLE strength/ROM, fair gait quality and impaired balance. Patient also demonstrates improved endurance with aerobic based exercise during today's session. Pt continuing to demonstrate 121 degrees  of knee flexion and neutral extension with no pain reported during knee drive stretch today. Patient able to progress dynamic knee and core activation exercises today with hip flexion with dorsiflexion resistance and step up variations, good performance with verbal cueing. Patient would continue to benefit from skilled physical therapy for increased endurance with ambulation, increased LE strength/ROM, and improved balance for improved quality of life, improved independence with management of right knee and continued progress towards therapy goals.  Patient is a 73 y.o. female who was seen today for physical therapy evaluation and treatment for M17.11 (ICD-10-CM) - Primary osteoarthritis of right knee. Patient demonstrates decreased RLE strength/ROM, abnormal pain of right knee, and impaired balance. Patient also demonstrates difficulty with ambulation during today's session with decreased stride length and velocity noted on RLE especially with prolonged walking. Patient also demonstrates limited knee flexion although postoperative bandaging still donned. Patient requires education on role of PT, importance of progression of exercises and allowing time for proper healing of knee tissues. Patient would benefit from skilled physical therapy for increased endurance with ambulation, increased RLE strength, and balance for improved gait quality, return to higher level of function with ADLs, and progress towards therapy goals.   OBJECTIVE IMPAIRMENTS: Abnormal gait, decreased activity tolerance, decreased balance, decreased  endurance, decreased mobility, decreased ROM, decreased strength, and pain.   ACTIVITY LIMITATIONS: carrying, lifting, bending, sitting, standing, squatting, stairs, and transfers  PARTICIPATION LIMITATIONS: meal prep, cleaning, laundry, driving, shopping, community activity, and yard work  PERSONAL FACTORS: Age, Past/current experiences, and 1 comorbidity: LTKA earlier this year are also  affecting patient's functional outcome.   REHAB POTENTIAL: Good  CLINICAL DECISION MAKING: Stable/uncomplicated  EVALUATION COMPLEXITY: Low   GOALS: Goals reviewed with patient? No  SHORT TERM GOALS: Target date: 07/22/24  Patient will demonstrate evidence of independence with individualized HEP and will report compliance for at least 3 days per week for optimized progression towards remaining therapy goals. Baseline:  Goal status: INITIAL  2.  Patient will report a decrease in pain level during community ambulation by at least 2 points for improved quality of life. Baseline: 5/10 Goal status: INITIAL     LONG TERM GOALS: Target date: 08/12/24  Pt will demonstrate a an increase of at least 9 points on the LEFS for improved performance of community ambulation and ADL. Baseline: see objective Goal status: INITIAL  2.  Pt will improve 2 MWT by 50 feet in order to demonstrate improved functional ambulatory capacity in community setting.  Baseline: see objective Goal status: INITIAL  3.  Pt will demonstrate WFL ROM (flexion and extension) in right knee, for increased mobility and maximal efficiency of gait cycle during ambulation. Baseline: see objective Goal status: INITIAL  4.  Pt will demonstrate at least 4-/5 MMT for right lower extremity for increased strength during ADL and community ambulation. Baseline: see objective Goal status: INITIAL  5.  Pt will improve 5TSTS by at least 2.3 seconds in order to improve strength during functional transfers. Baseline: see objective Goal status: INITIAL    PLAN:  PT FREQUENCY: 1-2x/week  PT DURATION: 6 weeks  PLANNED INTERVENTIONS: 97110-Therapeutic exercises, 97530- Therapeutic activity, [id number]- Neuromuscular re-education, 97535- Self Care, [id number]- Manual therapy, [phone]- Gait training, Patient/Family education, Balance training, Stair training, Joint mobilization, DME instructions, Cryotherapy, and Moist heat  PLAN FOR NEXT  SESSION: progress RLE strengthening and balance.   Lang Ada, PT, DPT [facility] Office: [phone] 1:49 PM, 08/01/24

## 2024-08-01 NOTE — Progress Notes (Signed)
   Chief Complaint  Patient presents with   Routine Post Op    Encounter Diagnoses  Name Primary?   S/P total knee replacement, right 06/17/24 I and D 07/08/24 Yes   Cellulitis of right knee    Aftercare following left knee joint replacement surgery     DOI/DOS/ Date: TKR 06/17/24 I&D 07/08/24  Improved  Pam continues to improve after her incision and drainage for superficial wound infection  She is currently on antibiotics, clindamycin  She has returned to physical therapy progressing nicely  Ambulates today no cane no significant pain some soreness on the right superolateral part of the knee  Wound is clean dry and intact with several Vicryl sutures visible 1 was removed the other 3 are still buried.  Recommend alcohol  Neosporin and covered with Band-Aid  Recheck 2 weeks

## 2024-08-01 NOTE — Progress Notes (Signed)
   There were no vitals taken for this visit.  There is no height or weight on file to calculate BMI.  Chief Complaint  Patient presents with   Routine Post Op    Encounter Diagnosis  Name Primary?   S/P total knee replacement, right 06/17/24 I and D 07/08/24 Yes    DOI/DOS/ Date: TKR 06/17/24 I&D 07/08/24  Improved

## 2024-08-05 ENCOUNTER — Encounter (HOSPITAL_COMMUNITY): Payer: Self-pay

## 2024-08-05 ENCOUNTER — Ambulatory Visit (HOSPITAL_COMMUNITY): Attending: Orthopedic Surgery

## 2024-08-05 DIAGNOSIS — R29898 Other symptoms and signs involving the musculoskeletal system: Secondary | ICD-10-CM | POA: Insufficient documentation

## 2024-08-05 DIAGNOSIS — M25661 Stiffness of right knee, not elsewhere classified: Secondary | ICD-10-CM | POA: Diagnosis not present

## 2024-08-05 DIAGNOSIS — Z7409 Other reduced mobility: Secondary | ICD-10-CM | POA: Diagnosis not present

## 2024-08-05 NOTE — Therapy (Signed)
 OUTPATIENT PHYSICAL THERAPY LOWER EXTREMITY TREATMENT  Progress Note Reporting Period 07/01/24 to 08/05/24  See note below for Objective Data and Assessment of Progress/Goals.     Patient Name: Diana Skinner MRN: [id number] DOB:1951/07/31, 73 y.o., female Today's Date: 08/05/2024  END OF SESSION:  PT End of Session - 08/05/24 1300     Visit Number 6    Number of Visits 12    Date for PT Re-Evaluation 08/12/24    Authorization Type [employer] MEDICARE    Authorization Time Period 6 approved from 07/04/24 to 09/05/24    Authorization - Visit Number 5    Authorization - Number of Visits 6    Progress Note Due on Visit 10   PN performed on aproved visit 5   PT Start Time 1300    PT Stop Time 1343    PT Time Calculation (min) 43 min    Activity Tolerance Patient tolerated treatment well    Behavior During Therapy [facility] for tasks assessed/performed              Past Medical History:  Diagnosis Date   Anxiety    Arthritis    Asthma    High cholesterol    HTN (hypertension)    Hypothyroidism    Migraines    Past Surgical History:  Procedure Laterality Date   ASPIRATION OF ABSCESS Right 07/08/2024   Procedure: JOINT ASPIRATION RIGHT KNEE;  Surgeon: Margrette Taft BRAVO, MD;  Location: [facility];  Service: Orthopedics;  Laterality: Right;   CATARACT EXTRACTION W/PHACO Right 01/27/2019   Procedure: CATARACT EXTRACTION PHACO AND INTRAOCULAR LENS PLACEMENT (IOC);  Surgeon: Harrie Agent, MD;  Location: [facility];  Service: Ophthalmology;  Laterality: Right;  CDE: 8.33   CATARACT EXTRACTION W/PHACO Left 02/10/2019   Procedure: CATARACT EXTRACTION PHACO AND INTRAOCULAR LENS PLACEMENT (IOC);  Surgeon: Harrie Agent, MD;  Location: [facility];  Service: Ophthalmology;  Laterality: Left;  CDE: 5.49   CESAREAN SECTION     COLONOSCOPY  06/19/2012   Procedure: COLONOSCOPY;  Surgeon: Claudis RAYMOND Rivet, MD;  Location: [facility];  Service: Endoscopy;  Laterality: N/A;  1030   INCISION AND DRAINAGE OF  WOUND Right 07/08/2024   Procedure: IRRIGATION AND DEBRIDEMENT WOUND, RIGHT TOTAL KNEE INCISION;  Surgeon: Margrette Taft BRAVO, MD;  Location: [facility];  Service: Orthopedics;  Laterality: Right;   ORIF RADIAL FRACTURE Right 08/27/2016   Procedure: OPEN REDUCTION INTERNAL FIXATION (ORIF) RADIAL FRACTURE/DISTAL;  Surgeon: Balinda Rogue, MD;  Location: [facility];  Service: Plastics;  Laterality: Right;   TONSILLECTOMY AND ADENOIDECTOMY     TOTAL KNEE ARTHROPLASTY Left 03/25/2024   Procedure: ARTHROPLASTY, KNEE, TOTAL;  Surgeon: Margrette Taft BRAVO, MD;  Location: [facility];  Service: Orthopedics;  Laterality: Left;   TOTAL KNEE ARTHROPLASTY Right 06/17/2024   Procedure: ARTHROPLASTY, KNEE, TOTAL;  Surgeon: Margrette Taft BRAVO, MD;  Location: [facility];  Service: Orthopedics;  Laterality: Right;   Patient Active Problem List   Diagnosis Date Noted   Infection of total knee replacement (HCC) 07/08/2024   Primary osteoarthritis of right knee 06/17/2024   S/P total knee arthroplasty, left 03/25/24 04/08/2024   Primary osteoarthritis of left knee 03/25/2024   Fracture of right wrist 08/27/2016   Bursitis of knee 05/30/2011   KNEE PAIN 01/10/2010   CLOSED FRACTURE OF UPPER END OF TIBIA 01/10/2010   DERANGEMENT MENISCUS 01/03/2010    PCP: Marvine Rush, MD   REFERRING PROVIDER: Margrette Taft BRAVO, MD  REFERRING DIAG: M17.11 (ICD-10-CM) - Primary osteoarthritis  of right knee  THERAPY DIAG:  Weakness of right lower extremity  Impaired functional mobility, balance, and endurance  Decreased ROM of right knee  Rationale for Evaluation and Treatment: Rehabilitation  ONSET DATE: 06/17/24 R TKA  SUBJECTIVE:   SUBJECTIVE STATEMENT: Pt states right knee still bothers her at night, numbness feeling makes it hard to get comfortable.  Pt states she feels she is about 85%-90% at this time and feels one more visit and she would feel comfortable with HEP independence at home.   Pt states that she does has some  sharp pain along the front of the right knee pain. Pt states she had to go before postop due to bleeding of incision. Pt states follow up is Thursday. Pt states she would like to be able to handle the new grandchild. Pt states she had two weeks of home therapy, pt states she only has used the [facility] due to the insistence of the home therapist. Pt states she has been wearing the compression sleeve but not wearing one upon presentation. Pt has used the CPM but has toned it back since she was bleeding some.   PERTINENT HISTORY: 04/25 left knee replacement  PAIN:  Are you having pain? Yes: NPRS scale: 5/10 Pain location: front of knee and anterior thigh Pain description: discomfort, some sharp pain Aggravating factors: bending, squatting, lifting Relieving factors: pain meds, ice  PRECAUTIONS: Knee  RED FLAGS: None   WEIGHT BEARING RESTRICTIONS: No  FALLS:  Has patient fallen in last 6 months? No  LIVING ENVIRONMENT: Lives with: lives with their family and lives with their spouse Lives in: House/apartment Stairs: Yes: External: 5 steps; on right going up Has following equipment at home: Single point cane and Walker - 2 wheeled  OCCUPATION: retired  PLOF: Independent with basic ADLs  PATIENT GOALS: keep up with grandchildren, walk the green way again  NEXT MD VISIT: Thursday the 31st of July  OBJECTIVE:  Note: Objective measures were completed at Evaluation unless otherwise noted.  DIAGNOSTIC FINDINGS: CLINICAL DATA:  Status post right knee arthroplasty   EXAM: PORTABLE RIGHT KNEE - 2 VIEW   COMPARISON:  Right knee radiograph dated 03/03/2024   FINDINGS: Postsurgical changes of right knee arthroplasty. Hardware appears intact and well seated. No acute fracture or dislocation. Midline surgical staples along the anterior knee. Overlying soft tissue swelling and subcutaneous emphysema.   IMPRESSION: Postsurgical changes of right knee arthroplasty.  PATIENT SURVEYS:  LEFS:   45/80 LEFS 08/05/24: 76/80  COGNITION: Overall cognitive status: Within functional limits for tasks assessed     SENSATION: Some numbness and tingling on outside of right knee  EDEMA:  Normal post op edema  PALPATION: Tenderness to anterior knee, tenderness to anterior and posterior proximal thigh  LOWER EXTREMITY ROM:  Active ROM Right eval Left eval Right 08/05/24  Hip flexion     Hip extension     Hip abduction     Hip adduction     Hip internal rotation     Hip external rotation     Knee flexion 112  119 degrees  Knee extension 5 degrees from neutral  1 degree past neutral  Ankle dorsiflexion     Ankle plantarflexion     Ankle inversion     Ankle eversion      (Blank rows = not tested)  LOWER EXTREMITY MMT:  MMT Right eval Left eval  Hip flexion    Hip extension    Hip abduction    Hip  adduction    Hip internal rotation    Hip external rotation    Knee flexion 4+ 5  Knee extension 4+ 5  Ankle dorsiflexion    Ankle plantarflexion    Ankle inversion    Ankle eversion     (Blank rows = not tested)  FUNCTIONAL TESTS:  5 times sit to stand: 13.24 seconds with bilateral UE support 2 minute walk test: 380 feet no AD  SLS 07/01/24: R: 7.03s  L: 8.12s 08/05/24: 5TSTS: 11.11 seconds no UE support  GAIT: Distance walked: 380 feet, with no AD Assistive device utilized: None Level of assistance: Complete Independence Comments: decreased stance time on RLE noted especially during end of .                                                                                                                                TREATMENT DATE:  08/05/2024 Therapeutic Exercise: -Treadmill, 5 minutes, grade 2.0 incline, pt for distance from monitor and speed control, speed 1.3>1.7>2.1>2.4 -Knee drive stretch on 12 inch step, 1 sets of 5 reps, 10 second hold, pt cued for max ROM -Seated hamstring stretch, 2 sets of 30 second holds, pt cued for hip extension and anterior  pelvic tilt Neuromuscular Re-education: -Agility ladder work for advance community movements, in and out diagonals, hop scotch and grapevine, pt cued for sequencing verbal and tactile and demonstration, 2 laps each variation, pt cued for increased pace as familiarity grows -Step up and overs, 8 inch step, RLE only, pt cued for sequencing and no UE support, 1 set of 10 reps -Lateral step up and overs, 8 inch step, 1 sets of 8 reps, pt cued for safety looking at feet and decreased UE support as possible   07/29/2024  Therapeutic Exercise: -Stationary bike, 5 minutes, level 4 resistance, pt cued for full rotations, able to power bike up, seat 9 -3 way kick with 3lb ankle weights, 1 set of 7 reps, bilaterally, pt cued for maxquad contraction -Standing marches/butt kicks with 3lb ankle weights, 30 second bouts, 2 bouts each, pt cued for max hip and knee ROM -Knee drive stretch on 12 inch step, 1 sets of 10 reps, 2 second hold, pt cued for max ROM (121 of flexion degrees obtained) -TKE on 4 inch step, 2 sets of 8 reps, pt cued for decreased UE support -Seated hamstring stretch, 2 sets of 30 second holds, pt cued for hip extension and anterior pelvic tilt Neuromuscular Re-education: -Aeromat walks in parallel bars with 3lb ankle weights, 3 lengths of mat, 3 laps each variation, lateral stepping and tandem stepping, pt cued for one UE support -Sit to stands to SLS with yellow ball trampoline toss, 1 sets of 5 reps, 3 tosses per rep, pt cued for core activation, and use of BLE equally Therapeutic Activity: -Step up and overs, 8 inch step, 2 set of 8 reps, pt cued for one UE -Lateral step up and overs,  8 inch step, 1 set of 8 reps, pt cued for one UE support  07/25/2024  Therapeutic Exercise: -Stationary bike, 5 minutes, level 1 resistance, pt cued for full rotations, able to power bike up, seat 8 -Supine bridges on green theraball 2 sets of 10 reps, 5 second holds, pt cued for max hip extension, second  set hamstring curl added -DKTC on green exercise ball, 2 sets of 10 reps, pt cued for max hip flexion and knee flexion -Supine heel slides, 1 set of 10 reps, 3 second holds, pt cued for controlled motion -SLR, 1 set of 12 reps RLE only, pt cued for maxquad contraction -Knee drive stretch on 12 inch step, 1 sets of 10 reps, pt cued for max ROM (118 of flexion degrees obtained) -Standing calf stretch, 2 sets of 30 second holds, pt cued for hip extension and anterior pelvic tilt Manual Therapy: -STM to R knee, quads, hamstrings and calf region -Patellar mobilizations, inferior/superior, medial and lateral, 10 reps each direction Therapeutic Activity: -Stair ambulation, 5 bouts, 4 steps, pt able to complete last 2 bouts with no UE support    PATIENT EDUCATION:  Education details: Pt was educated on findings of PT evaluation, prognosis, frequency of therapy visits and rationale, attendance policy, and HEP if given.   Person educated: Patient Education method: Explanation, Verbal cues, and Handouts Education comprehension: verbalized understanding, verbal cues required, and needs further education  HOME EXERCISE PROGRAM: Access Code: [email or web address] URL: [email or web address] Date: 07/01/2024 Prepared by: Lang Ada  Exercises - Supine Bridge  - 1 x daily - 7 x weekly - 3 sets - 10 reps - 3 second hold - Sidelying Hip Abduction  - 1 x daily - 7 x weekly - 3 sets - 10 reps - Supine Heel Slide  - 1 x daily - 7 x weekly - 3 sets - 10 reps  ASSESSMENT:  CLINICAL IMPRESSION: Patient continues to demonstrate improved RLE strength/ROM, improved gait quality and fair balance. Patient also demonstrates improved endurance with aerobic based exercise during today's session with treadmill. Pt continuing to demonstrate [facility] R knee ROM with no pain reported during knee drive stretch today. Pt does continue to demonstrate point tenderness at site of fluid drain and hamstring region. Patient able  to progress dynamic knee and core activation exercises today with agility ladder work, good performance even with SLS with verbal cueing. Patient would continue to benefit from skilled physical therapy for increased endurance with ambulation, and improved balance for improved quality of life, improved independence with management of right knee and continued progress towards therapy goals. Plan to discharge after next session if remaining goals are met.   Patient is a 73 y.o. female who was seen today for physical therapy evaluation and treatment for M17.11 (ICD-10-CM) - Primary osteoarthritis of right knee. Patient demonstrates decreased RLE strength/ROM, abnormal pain of right knee, and impaired balance. Patient also demonstrates difficulty with ambulation during today's session with decreased stride length and velocity noted on RLE especially with prolonged walking. Patient also demonstrates limited knee flexion although postoperative bandaging still donned. Patient requires education on role of PT, importance of progression of exercises and allowing time for proper healing of knee tissues. Patient would benefit from skilled physical therapy for increased endurance with ambulation, increased RLE strength, and balance for improved gait quality, return to higher level of function with ADLs, and progress towards therapy goals.   OBJECTIVE IMPAIRMENTS: Abnormal gait, decreased activity tolerance, decreased balance, decreased endurance, decreased mobility, decreased ROM, decreased  strength, and pain.   ACTIVITY LIMITATIONS: carrying, lifting, bending, sitting, standing, squatting, stairs, and transfers  PARTICIPATION LIMITATIONS: meal prep, cleaning, laundry, driving, shopping, community activity, and yard work  PERSONAL FACTORS: Age, Past/current experiences, and 1 comorbidity: LTKA earlier this year are also affecting patient's functional outcome.   REHAB POTENTIAL: Good  CLINICAL DECISION MAKING:  Stable/uncomplicated  EVALUATION COMPLEXITY: Low   GOALS: Goals reviewed with patient? No  SHORT TERM GOALS: Target date: 07/22/24  Patient will demonstrate evidence of independence with individualized HEP and will report compliance for at least 3 days per week for optimized progression towards remaining therapy goals. Baseline:  Goal status: MET  2.  Patient will report a decrease in pain level during community ambulation by at least 2 points for improved quality of life. Baseline: 5/10 Goal status: INITIAL     LONG TERM GOALS: Target date: 08/12/24  Pt will demonstrate a an increase of at least 9 points on the LEFS for improved performance of community ambulation and ADL. Baseline: see objective Goal status: MET  2.  Pt will improve 2 MWT by 50 feet in order to demonstrate improved functional ambulatory capacity in community setting.  Baseline: see objective Goal status: INITIAL  3.  Pt will demonstrate WFL ROM (flexion and extension) in right knee, for increased mobility and maximal efficiency of gait cycle during ambulation. Baseline: see objective Goal status: MET  4.  Pt will demonstrate at least 4-/5 MMT for right lower extremity for increased strength during ADL and community ambulation. Baseline: see objective Goal status: MET  5.  Pt will improve 5TSTS by at least 2.3 seconds in order to improve strength during functional transfers. Baseline: see objective Goal status: MET no UE support used 08/05/24    PLAN:  PT FREQUENCY: 1-2x/week  PT DURATION: 6 weeks  PLANNED INTERVENTIONS: 97110-Therapeutic exercises, 97530- Therapeutic activity, 97112- Neuromuscular re-education, 97535- Self Care, [id number]- Manual therapy, [phone]- Gait training, Patient/Family education, Balance training, Stair training, Joint mobilization, DME instructions, Cryotherapy, and Moist heat  PLAN FOR NEXT SESSION: probable discharge next session   Lang Ada, PT, DPT [facility] Office: [phone] 1:55 PM, 08/05/24

## 2024-08-08 ENCOUNTER — Ambulatory Visit (HOSPITAL_COMMUNITY)

## 2024-08-08 ENCOUNTER — Encounter (HOSPITAL_COMMUNITY): Payer: Self-pay

## 2024-08-08 DIAGNOSIS — M25661 Stiffness of right knee, not elsewhere classified: Secondary | ICD-10-CM

## 2024-08-08 DIAGNOSIS — Z7409 Other reduced mobility: Secondary | ICD-10-CM

## 2024-08-08 DIAGNOSIS — R29898 Other symptoms and signs involving the musculoskeletal system: Secondary | ICD-10-CM

## 2024-08-08 NOTE — Therapy (Signed)
 OUTPATIENT PHYSICAL THERAPY LOWER EXTREMITY TREATMENT   PHYSICAL THERAPY DISCHARGE SUMMARY  Visits from Start of Care: 6  Current functional level related to goals / functional outcomes: WFL   Remaining deficits: Balance deficits but WFL and at baseline.   Education / Equipment: HEP continuance and physical activity   Patient agrees to discharge. Patient goals were met. Patient is being discharged due to meeting the stated rehab goals.  Patient Name: Diana Skinner MRN: [id number] DOB:15-Nov-1951, 73 y.o., female Today's Date: 08/08/2024  END OF SESSION:  PT End of Session - 08/08/24 1303     Visit Number 7    Number of Visits 12    Date for PT Re-Evaluation 08/12/24    Authorization Type [employer] MEDICARE    Authorization Time Period 6 approved from 07/04/24 to 09/05/24    Authorization - Visit Number 6    Authorization - Number of Visits 6    Progress Note Due on Visit 10    PT Start Time 1303    PT Stop Time 1335    PT Time Calculation (min) 32 min    Activity Tolerance Patient tolerated treatment well    Behavior During Therapy [facility] for tasks assessed/performed               Past Medical History:  Diagnosis Date   Anxiety    Arthritis    Asthma    High cholesterol    HTN (hypertension)    Hypothyroidism    Migraines    Past Surgical History:  Procedure Laterality Date   ASPIRATION OF ABSCESS Right 07/08/2024   Procedure: JOINT ASPIRATION RIGHT KNEE;  Surgeon: Margrette Taft BRAVO, MD;  Location: [facility];  Service: Orthopedics;  Laterality: Right;   CATARACT EXTRACTION W/PHACO Right 01/27/2019   Procedure: CATARACT EXTRACTION PHACO AND INTRAOCULAR LENS PLACEMENT (IOC);  Surgeon: Harrie Agent, MD;  Location: [facility];  Service: Ophthalmology;  Laterality: Right;  CDE: 8.33   CATARACT EXTRACTION W/PHACO Left 02/10/2019   Procedure: CATARACT EXTRACTION PHACO AND INTRAOCULAR LENS PLACEMENT (IOC);  Surgeon: Harrie Agent, MD;  Location: [facility];  Service:  Ophthalmology;  Laterality: Left;  CDE: 5.49   CESAREAN SECTION     COLONOSCOPY  06/19/2012   Procedure: COLONOSCOPY;  Surgeon: Claudis RAYMOND Rivet, MD;  Location: [facility];  Service: Endoscopy;  Laterality: N/A;  1030   INCISION AND DRAINAGE OF WOUND Right 07/08/2024   Procedure: IRRIGATION AND DEBRIDEMENT WOUND, RIGHT TOTAL KNEE INCISION;  Surgeon: Margrette Taft BRAVO, MD;  Location: [facility];  Service: Orthopedics;  Laterality: Right;   ORIF RADIAL FRACTURE Right 08/27/2016   Procedure: OPEN REDUCTION INTERNAL FIXATION (ORIF) RADIAL FRACTURE/DISTAL;  Surgeon: Balinda Rogue, MD;  Location: [facility];  Service: Plastics;  Laterality: Right;   TONSILLECTOMY AND ADENOIDECTOMY     TOTAL KNEE ARTHROPLASTY Left 03/25/2024   Procedure: ARTHROPLASTY, KNEE, TOTAL;  Surgeon: Margrette Taft BRAVO, MD;  Location: [facility];  Service: Orthopedics;  Laterality: Left;   TOTAL KNEE ARTHROPLASTY Right 06/17/2024   Procedure: ARTHROPLASTY, KNEE, TOTAL;  Surgeon: Margrette Taft BRAVO, MD;  Location: [facility];  Service: Orthopedics;  Laterality: Right;   Patient Active Problem List   Diagnosis Date Noted   Infection of total knee replacement (HCC) 07/08/2024   Primary osteoarthritis of right knee 06/17/2024   S/P total knee arthroplasty, left 03/25/24 04/08/2024   Primary osteoarthritis of left knee 03/25/2024   Fracture of right wrist 08/27/2016   Bursitis of knee 05/30/2011   KNEE PAIN 01/10/2010  CLOSED FRACTURE OF UPPER END OF TIBIA 01/10/2010   DERANGEMENT MENISCUS 01/03/2010    PCP: Marvine Rush, MD   REFERRING PROVIDER: Margrette Taft BRAVO, MD  REFERRING DIAG: M17.11 (ICD-10-CM) - Primary osteoarthritis of right knee  THERAPY DIAG:  Weakness of right lower extremity  Impaired functional mobility, balance, and endurance  Decreased ROM of right knee  Rationale for Evaluation and Treatment: Rehabilitation  ONSET DATE: 06/17/24 R TKA  SUBJECTIVE:   SUBJECTIVE STATEMENT: Pt states that she has had a  pretty busy morning with some cleaning around the house. Pt states she is happy with the way she has rebounded since the infection.  Pt states that she does has some sharp pain along the front of the right knee pain. Pt states she had to go before postop due to bleeding of incision. Pt states follow up is Thursday. Pt states she would like to be able to handle the new grandchild. Pt states she had two weeks of home therapy, pt states she only has used the [facility] due to the insistence of the home therapist. Pt states she has been wearing the compression sleeve but not wearing one upon presentation. Pt has used the CPM but has toned it back since she was bleeding some.   PERTINENT HISTORY: 04/25 left knee replacement  PAIN:  Are you having pain? Yes: NPRS scale: 5/10 Pain location: front of knee and anterior thigh Pain description: discomfort, some sharp pain Aggravating factors: bending, squatting, lifting Relieving factors: pain meds, ice  PRECAUTIONS: Knee  RED FLAGS: None   WEIGHT BEARING RESTRICTIONS: No  FALLS:  Has patient fallen in last 6 months? No  LIVING ENVIRONMENT: Lives with: lives with their family and lives with their spouse Lives in: House/apartment Stairs: Yes: External: 5 steps; on right going up Has following equipment at home: Single point cane and Walker - 2 wheeled  OCCUPATION: retired  PLOF: Independent with basic ADLs  PATIENT GOALS: keep up with grandchildren, walk the green way again  NEXT MD VISIT: Thursday the 31st of July  OBJECTIVE:  Note: Objective measures were completed at Evaluation unless otherwise noted.  DIAGNOSTIC FINDINGS: CLINICAL DATA:  Status post right knee arthroplasty   EXAM: PORTABLE RIGHT KNEE - 2 VIEW   COMPARISON:  Right knee radiograph dated 03/03/2024   FINDINGS: Postsurgical changes of right knee arthroplasty. Hardware appears intact and well seated. No acute fracture or dislocation. Midline surgical staples along the  anterior knee. Overlying soft tissue swelling and subcutaneous emphysema.   IMPRESSION: Postsurgical changes of right knee arthroplasty.  PATIENT SURVEYS:  LEFS:  45/80 LEFS 08/05/24: 76/80  COGNITION: Overall cognitive status: Within functional limits for tasks assessed     SENSATION: Some numbness and tingling on outside of right knee  EDEMA:  Normal post op edema  PALPATION: Tenderness to anterior knee, tenderness to anterior and posterior proximal thigh  LOWER EXTREMITY ROM:  Active ROM Right eval Left eval Right 08/05/24 Right 08/08/24  Hip flexion      Hip extension      Hip abduction      Hip adduction      Hip internal rotation      Hip external rotation      Knee flexion 112  119 degrees 120 degrees  Knee extension 5 degrees from neutral  1 degree past neutral Neutral, 0 degrees  Ankle dorsiflexion      Ankle plantarflexion      Ankle inversion      Ankle eversion       (  Blank rows = not tested)  LOWER EXTREMITY MMT:  MMT Right eval Left eval Right 08/08/24  Hip flexion     Hip extension     Hip abduction     Hip adduction     Hip internal rotation     Hip external rotation     Knee flexion 4+ 5 5  Knee extension 4+ 5 5  Ankle dorsiflexion     Ankle plantarflexion     Ankle inversion     Ankle eversion      (Blank rows = not tested)  FUNCTIONAL TESTS:  5 times sit to stand: 13.24 seconds with bilateral UE support 2 minute walk test: 380 feet no AD  SLS 07/01/24: R: 7.03s  L: 8.12s 08/05/24: 5TSTS: 11.11 seconds no UE support  08/08/24: : 558 feet, no increased pain, WNL gait pattern  GAIT: Distance walked: 380 feet, with no AD Assistive device utilized: None Level of assistance: Complete Independence Comments: decreased stance time on RLE noted especially during end of .                                                                                                                                TREATMENT DATE:  08/08/2024   Therapeutic Exercise: -Treadmill, 3.5 minutes, grade 2.0 incline, pt for distance from monitor and speed control, speed 1.8>2.3>2.5 - , 558 feet -Walking lunges, 2 sets of 2 bilaterally pt cued for decreased toe out compensation -Blue banded walks at ankles, monster walks and lateral stepping, 1 lap 10 steps each direction  Neuromuscular Re-education: -SLS trampoline tosses, 2 sets of 10 throws each LE, pt cued for increased time for ball to travel back instead of lurching forward for improved balance performance  08/05/2024 Therapeutic Exercise: -Treadmill, 5 minutes, grade 2.0 incline, pt for distance from monitor and speed control, speed 1.3>1.7>2.1>2.4 -Knee drive stretch on 12 inch step, 1 sets of 5 reps, 10 second hold, pt cued for max ROM -Seated hamstring stretch, 2 sets of 30 second holds, pt cued for hip extension and anterior pelvic tilt Neuromuscular Re-education: -Agility ladder work for advance community movements, in and out diagonals, hop scotch and grapevine, pt cued for sequencing verbal and tactile and demonstration, 2 laps each variation, pt cued for increased pace as familiarity grows -Step up and overs, 8 inch step, RLE only, pt cued for sequencing and no UE support, 1 set of 10 reps -Lateral step up and overs, 8 inch step, 1 sets of 8 reps, pt cued for safety looking at feet and decreased UE support as possible   07/29/2024  Therapeutic Exercise: -Stationary bike, 5 minutes, level 4 resistance, pt cued for full rotations, able to power bike up, seat 9 -3 way kick with 3lb ankle weights, 1 set of 7 reps, bilaterally, pt cued for maxquad contraction -Standing marches/butt kicks with 3lb ankle weights, 30 second bouts, 2 bouts each, pt cued for max hip and knee ROM -  Knee drive stretch on 12 inch step, 1 sets of 10 reps, 2 second hold, pt cued for max ROM (121 of flexion degrees obtained) -TKE on 4 inch step, 2 sets of 8 reps, pt cued for decreased UE support -Seated  hamstring stretch, 2 sets of 30 second holds, pt cued for hip extension and anterior pelvic tilt Neuromuscular Re-education: -Aeromat walks in parallel bars with 3lb ankle weights, 3 lengths of mat, 3 laps each variation, lateral stepping and tandem stepping, pt cued for one UE support -Sit to stands to SLS with yellow ball trampoline toss, 1 sets of 5 reps, 3 tosses per rep, pt cued for core activation, and use of BLE equally Therapeutic Activity: -Step up and overs, 8 inch step, 2 set of 8 reps, pt cued for one UE -Lateral step up and overs, 8 inch step, 1 set of 8 reps, pt cued for one UE support PATIENT EDUCATION:  Education details: Pt was educated on findings of PT evaluation, prognosis, frequency of therapy visits and rationale, attendance policy, and HEP if given.   Person educated: Patient Education method: Explanation, Verbal cues, and Handouts Education comprehension: verbalized understanding, verbal cues required, and needs further education  HOME EXERCISE PROGRAM: Access Code: [email or web address] URL: [email or web address] Date: 07/01/2024 Prepared by: Lang Ada  Exercises - Supine Bridge  - 1 x daily - 7 x weekly - 3 sets - 10 reps - 3 second hold - Sidelying Hip Abduction  - 1 x daily - 7 x weekly - 3 sets - 10 reps - Supine Heel Slide  - 1 x daily - 7 x weekly - 3 sets - 10 reps  Access Code: [email or web address] URL: [email or web address] Date: 08/08/2024 Prepared by: Lang Ada  Exercises - Forward Monster Walks  - 1 x daily - 7 x weekly - 3 sets - 10 reps - Backward Monster Walks  - 1 x daily - 7 x weekly - 3 sets - 10 reps - Side Stepping with Resistance at Feet  - 1 x daily - 7 x weekly - 3 sets - 10 reps - Standing Hamstring Curl with Resistance  - 1 x daily - 7 x weekly - 3 sets - 10 reps - Walking Forward Lunge  - 1 x daily - 7 x weekly - 3 sets - 10 reps  ASSESSMENT:  CLINICAL IMPRESSION: Patient continues to demonstrate WNL right knee ROM,  strength and gait quality. Patient also demonstrates improved tolerance with endurance activities with increased speed on treadmill and increased distance obtained during the . Reviewed goals with pt and revised HEP with banded walks and lunge variations. Patient able to continue dynamic balance and core activation exercises today with SLS ball tosses, good performance with verbal cueing. Patient to be discharged to independent HEP as pt has met all therapy goals and is comfortable with current level of function.     Patient is a 73 y.o. female who was seen today for physical therapy evaluation and treatment for M17.11 (ICD-10-CM) - Primary osteoarthritis of right knee. Patient demonstrates decreased RLE strength/ROM, abnormal pain of right knee, and impaired balance. Patient also demonstrates difficulty with ambulation during today's session with decreased stride length and velocity noted on RLE especially with prolonged walking. Patient also demonstrates limited knee flexion although postoperative bandaging still donned. Patient requires education on role of PT, importance of progression of exercises and allowing time for proper healing of knee tissues. Patient would benefit from skilled physical therapy for increased endurance with ambulation, increased RLE  strength, and balance for improved gait quality, return to higher level of function with ADLs, and progress towards therapy goals.   OBJECTIVE IMPAIRMENTS: Abnormal gait, decreased activity tolerance, decreased balance, decreased endurance, decreased mobility, decreased ROM, decreased strength, and pain.   ACTIVITY LIMITATIONS: carrying, lifting, bending, sitting, standing, squatting, stairs, and transfers  PARTICIPATION LIMITATIONS: meal prep, cleaning, laundry, driving, shopping, community activity, and yard work  PERSONAL FACTORS: Age, Past/current experiences, and 1 comorbidity: LTKA earlier this year are also affecting patient's functional  outcome.   REHAB POTENTIAL: Good  CLINICAL DECISION MAKING: Stable/uncomplicated  EVALUATION COMPLEXITY: Low   GOALS: Goals reviewed with patient? Yes  SHORT TERM GOALS: Target date: 07/22/24  Patient will demonstrate evidence of independence with individualized HEP and will report compliance for at least 3 days per week for optimized progression towards remaining therapy goals. Baseline:  Goal status: MET  2.  Patient will report a decrease in pain level during community ambulation by at least 2 points for improved quality of life. Baseline: 5/10 Goal status: MET     LONG TERM GOALS: Target date: 08/12/24  Pt will demonstrate a an increase of at least 9 points on the LEFS for improved performance of community ambulation and ADL. Baseline: see objective Goal status: MET  2.  Pt will improve 2 MWT by 50 feet in order to demonstrate improved functional ambulatory capacity in community setting.  Baseline: see objective Goal status: MET  3.  Pt will demonstrate WFL ROM (flexion and extension) in right knee, for increased mobility and maximal efficiency of gait cycle during ambulation. Baseline: see objective Goal status: MET  4.  Pt will demonstrate at least 4-/5 MMT for right lower extremity for increased strength during ADL and community ambulation. Baseline: see objective Goal status: MET  5.  Pt will improve 5TSTS by at least 2.3 seconds in order to improve strength during functional transfers. Baseline: see objective Goal status: MET no UE support used 08/05/24    PLAN:  PT FREQUENCY: 1-2x/week  PT DURATION: 6 weeks  PLANNED INTERVENTIONS: 97110-Therapeutic exercises, 97530- Therapeutic activity, 97112- Neuromuscular re-education, 97535- Self Care, [id number]- Manual therapy, [phone]- Gait training, Patient/Family education, Balance training, Stair training, Joint mobilization, DME instructions, Cryotherapy, and Moist heat  PLAN FOR NEXT SESSION:  Discharged   Lang Ada, PT, DPT [facility] Office: [phone] 1:44 PM, 08/08/24

## 2024-08-12 ENCOUNTER — Encounter (HOSPITAL_COMMUNITY)

## 2024-08-13 ENCOUNTER — Ambulatory Visit (HOSPITAL_COMMUNITY)

## 2024-08-15 ENCOUNTER — Encounter: Payer: Self-pay | Admitting: Orthopedic Surgery

## 2024-08-15 ENCOUNTER — Ambulatory Visit: Admitting: Orthopedic Surgery

## 2024-08-15 ENCOUNTER — Ambulatory Visit (HOSPITAL_COMMUNITY)

## 2024-08-15 DIAGNOSIS — Z96651 Presence of right artificial knee joint: Secondary | ICD-10-CM

## 2024-08-15 NOTE — Progress Notes (Signed)
   There were no vitals taken for this visit.  There is no height or weight on file to calculate BMI.  Chief Complaint  Patient presents with   Post-op Follow-up    Encounter Diagnosis  Name Primary?   S/P total knee replacement, right 06/17/24 I and D 07/08/24 Yes    DOI/DOS/ Date: 07/08/24  Improved

## 2024-08-15 NOTE — Progress Notes (Signed)
   Chief Complaint  Patient presents with   Post-op Follow-up    Encounter Diagnosis  Name Primary?   S/P total knee replacement, right 06/17/24 I and D 07/08/24 Yes    DOI/DOS/ Date: 07/08/24  Improved  No issues at present finish physical therapy with 130 degree range of motion.  Wound looks good.  There are 2 areas of Vicryl suture which will probably come to the surface she is treating those with alcohol  topical antibiotic and a Band-Aid  X-rays both knees in a year

## 2024-08-19 ENCOUNTER — Encounter (HOSPITAL_COMMUNITY)

## 2024-08-22 ENCOUNTER — Encounter (HOSPITAL_COMMUNITY)

## 2024-08-26 ENCOUNTER — Encounter (HOSPITAL_COMMUNITY)

## 2024-08-29 ENCOUNTER — Encounter (HOSPITAL_COMMUNITY)

## 2024-09-17 ENCOUNTER — Encounter (INDEPENDENT_AMBULATORY_CARE_PROVIDER_SITE_OTHER): Payer: Self-pay | Admitting: Gastroenterology

## 2024-12-15 ENCOUNTER — Other Ambulatory Visit: Payer: Self-pay | Admitting: Orthopedic Surgery

## 2025-06-11 ENCOUNTER — Ambulatory Visit: Admitting: Orthopedic Surgery
